# Patient Record
Sex: Female | Born: 1960 | Race: Black or African American | Hispanic: No | Smoking: Current every day smoker
Health system: Southern US, Community
[De-identification: ages and names within clinical notes are randomized; demographics above are authoritative.]

## PROBLEM LIST (undated history)

## (undated) ENCOUNTER — Emergency Department (HOSPITAL_COMMUNITY): Admission: EM | Payer: Medicare Other | Source: Home / Self Care

## (undated) DIAGNOSIS — I2510 Atherosclerotic heart disease of native coronary artery without angina pectoris: Secondary | ICD-10-CM

## (undated) DIAGNOSIS — I209 Angina pectoris, unspecified: Secondary | ICD-10-CM

## (undated) DIAGNOSIS — I498 Other specified cardiac arrhythmias: Secondary | ICD-10-CM

## (undated) DIAGNOSIS — N289 Disorder of kidney and ureter, unspecified: Secondary | ICD-10-CM

## (undated) DIAGNOSIS — Z9889 Other specified postprocedural states: Secondary | ICD-10-CM

## (undated) DIAGNOSIS — A388 Scarlet fever with other complications: Secondary | ICD-10-CM

## (undated) DIAGNOSIS — Z9189 Other specified personal risk factors, not elsewhere classified: Secondary | ICD-10-CM

## (undated) DIAGNOSIS — A6000 Herpesviral infection of urogenital system, unspecified: Secondary | ICD-10-CM

## (undated) DIAGNOSIS — D126 Benign neoplasm of colon, unspecified: Secondary | ICD-10-CM

## (undated) DIAGNOSIS — K759 Inflammatory liver disease, unspecified: Secondary | ICD-10-CM

## (undated) DIAGNOSIS — E785 Hyperlipidemia, unspecified: Secondary | ICD-10-CM

## (undated) DIAGNOSIS — K5792 Diverticulitis of intestine, part unspecified, without perforation or abscess without bleeding: Secondary | ICD-10-CM

## (undated) DIAGNOSIS — T7491XA Unspecified adult maltreatment, confirmed, initial encounter: Secondary | ICD-10-CM

## (undated) DIAGNOSIS — F509 Eating disorder, unspecified: Secondary | ICD-10-CM

## (undated) DIAGNOSIS — R51 Headache: Secondary | ICD-10-CM

## (undated) DIAGNOSIS — E119 Type 2 diabetes mellitus without complications: Secondary | ICD-10-CM

## (undated) DIAGNOSIS — R112 Nausea with vomiting, unspecified: Secondary | ICD-10-CM

## (undated) DIAGNOSIS — F4310 Post-traumatic stress disorder, unspecified: Secondary | ICD-10-CM

## (undated) DIAGNOSIS — F329 Major depressive disorder, single episode, unspecified: Secondary | ICD-10-CM

## (undated) DIAGNOSIS — E559 Vitamin D deficiency, unspecified: Secondary | ICD-10-CM

## (undated) DIAGNOSIS — R519 Headache, unspecified: Secondary | ICD-10-CM

## (undated) DIAGNOSIS — F32A Depression, unspecified: Secondary | ICD-10-CM

## (undated) DIAGNOSIS — I10 Essential (primary) hypertension: Secondary | ICD-10-CM

## (undated) DIAGNOSIS — J189 Pneumonia, unspecified organism: Secondary | ICD-10-CM

## (undated) DIAGNOSIS — K219 Gastro-esophageal reflux disease without esophagitis: Secondary | ICD-10-CM

## (undated) HISTORY — PX: ROTATOR CUFF REPAIR: SHX139

## (undated) HISTORY — DX: Other specified personal risk factors, not elsewhere classified: Z91.89

## (undated) HISTORY — DX: Headache: R51

## (undated) HISTORY — DX: Scarlet fever with other complications: A38.8

## (undated) HISTORY — DX: Unspecified adult maltreatment, confirmed, initial encounter: T74.91XA

## (undated) HISTORY — DX: Major depressive disorder, single episode, unspecified: F32.9

## (undated) HISTORY — PX: OTHER SURGICAL HISTORY: SHX169

## (undated) HISTORY — DX: Hyperlipidemia, unspecified: E78.5

## (undated) HISTORY — DX: Depression, unspecified: F32.A

## (undated) HISTORY — PX: UMBILICAL HERNIA REPAIR: SHX196

## (undated) HISTORY — DX: Atherosclerotic heart disease of native coronary artery without angina pectoris: I25.10

## (undated) HISTORY — DX: Eating disorder, unspecified: F50.9

## (undated) HISTORY — DX: Vitamin D deficiency, unspecified: E55.9

## (undated) HISTORY — DX: Diverticulitis of intestine, part unspecified, without perforation or abscess without bleeding: K57.92

## (undated) HISTORY — DX: Herpesviral infection of urogenital system, unspecified: A60.00

## (undated) HISTORY — PX: BUNIONECTOMY: SHX129

## (undated) HISTORY — DX: Type 2 diabetes mellitus without complications: E11.9

## (undated) HISTORY — PX: ENDOMETRIAL ABLATION: SHX621

## (undated) HISTORY — DX: Headache, unspecified: R51.9

## (undated) HISTORY — PX: SHOULDER SURGERY: SHX246

---
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
## 2011-11-18 DIAGNOSIS — A498 Other bacterial infections of unspecified site: Secondary | ICD-10-CM | POA: Insufficient documentation

## 2012-01-13 DIAGNOSIS — M5030 Other cervical disc degeneration, unspecified cervical region: Secondary | ICD-10-CM

## 2012-01-13 DIAGNOSIS — M549 Dorsalgia, unspecified: Secondary | ICD-10-CM | POA: Insufficient documentation

## 2012-01-13 DIAGNOSIS — M4317 Spondylolisthesis, lumbosacral region: Secondary | ICD-10-CM

## 2012-01-13 DIAGNOSIS — G894 Chronic pain syndrome: Secondary | ICD-10-CM | POA: Insufficient documentation

## 2012-01-13 DIAGNOSIS — Z79899 Other long term (current) drug therapy: Secondary | ICD-10-CM | POA: Insufficient documentation

## 2012-01-13 HISTORY — DX: Chronic pain syndrome: G89.4

## 2012-01-13 HISTORY — DX: Other cervical disc degeneration, unspecified cervical region: M50.30

## 2012-01-13 HISTORY — DX: Other long term (current) drug therapy: Z79.899

## 2012-01-13 HISTORY — DX: Spondylolisthesis, lumbosacral region: M43.17

## 2012-01-23 DIAGNOSIS — R109 Unspecified abdominal pain: Secondary | ICD-10-CM | POA: Insufficient documentation

## 2012-01-23 DIAGNOSIS — R10A1 Flank pain, right side: Secondary | ICD-10-CM | POA: Insufficient documentation

## 2013-02-28 DIAGNOSIS — G4700 Insomnia, unspecified: Secondary | ICD-10-CM

## 2013-02-28 HISTORY — DX: Insomnia, unspecified: G47.00

## 2013-08-14 DIAGNOSIS — N35919 Unspecified urethral stricture, male, unspecified site: Secondary | ICD-10-CM

## 2013-08-14 HISTORY — DX: Unspecified urethral stricture, male, unspecified site: N35.919

## 2013-11-08 DIAGNOSIS — M129 Arthropathy, unspecified: Secondary | ICD-10-CM

## 2013-11-08 HISTORY — DX: Arthropathy, unspecified: M12.9

## 2013-12-26 DIAGNOSIS — F17200 Nicotine dependence, unspecified, uncomplicated: Secondary | ICD-10-CM

## 2013-12-26 HISTORY — DX: Nicotine dependence, unspecified, uncomplicated: F17.200

## 2014-02-16 DIAGNOSIS — J189 Pneumonia, unspecified organism: Secondary | ICD-10-CM

## 2014-02-16 HISTORY — DX: Pneumonia, unspecified organism: J18.9

## 2014-02-16 HISTORY — PX: URETHROPLASTY: SHX499

## 2014-02-28 DIAGNOSIS — J208 Acute bronchitis due to other specified organisms: Secondary | ICD-10-CM | POA: Insufficient documentation

## 2014-06-05 DIAGNOSIS — M542 Cervicalgia: Secondary | ICD-10-CM | POA: Insufficient documentation

## 2014-06-05 DIAGNOSIS — M2550 Pain in unspecified joint: Secondary | ICD-10-CM | POA: Insufficient documentation

## 2014-07-18 DIAGNOSIS — IMO0002 Reserved for concepts with insufficient information to code with codable children: Secondary | ICD-10-CM

## 2014-07-18 HISTORY — DX: Reserved for concepts with insufficient information to code with codable children: IMO0002

## 2014-09-04 DIAGNOSIS — F332 Major depressive disorder, recurrent severe without psychotic features: Secondary | ICD-10-CM | POA: Insufficient documentation

## 2014-09-04 HISTORY — DX: Major depressive disorder, recurrent severe without psychotic features: F33.2

## 2014-09-17 DIAGNOSIS — F419 Anxiety disorder, unspecified: Secondary | ICD-10-CM | POA: Insufficient documentation

## 2014-09-17 DIAGNOSIS — F329 Major depressive disorder, single episode, unspecified: Secondary | ICD-10-CM | POA: Insufficient documentation

## 2014-09-17 DIAGNOSIS — F32A Depression, unspecified: Secondary | ICD-10-CM | POA: Insufficient documentation

## 2014-09-17 DIAGNOSIS — E876 Hypokalemia: Secondary | ICD-10-CM

## 2014-09-17 HISTORY — DX: Anxiety disorder, unspecified: F41.9

## 2014-09-17 HISTORY — DX: Hypokalemia: E87.6

## 2014-11-02 DIAGNOSIS — M7918 Myalgia, other site: Secondary | ICD-10-CM | POA: Insufficient documentation

## 2014-11-22 DIAGNOSIS — F1390 Sedative, hypnotic, or anxiolytic use, unspecified, uncomplicated: Secondary | ICD-10-CM | POA: Insufficient documentation

## 2014-11-22 DIAGNOSIS — F1310 Sedative, hypnotic or anxiolytic abuse, uncomplicated: Secondary | ICD-10-CM

## 2014-11-22 HISTORY — DX: Sedative, hypnotic, or anxiolytic use, unspecified, uncomplicated: F13.90

## 2015-02-17 DIAGNOSIS — N289 Disorder of kidney and ureter, unspecified: Secondary | ICD-10-CM

## 2015-02-17 HISTORY — DX: Disorder of kidney and ureter, unspecified: N28.9

## 2015-03-06 DIAGNOSIS — M7551 Bursitis of right shoulder: Secondary | ICD-10-CM | POA: Insufficient documentation

## 2015-03-06 HISTORY — DX: Bursitis of right shoulder: M75.51

## 2015-03-18 DIAGNOSIS — M47817 Spondylosis without myelopathy or radiculopathy, lumbosacral region: Secondary | ICD-10-CM | POA: Insufficient documentation

## 2015-03-18 DIAGNOSIS — M47812 Spondylosis without myelopathy or radiculopathy, cervical region: Secondary | ICD-10-CM | POA: Insufficient documentation

## 2015-03-18 HISTORY — DX: Spondylosis without myelopathy or radiculopathy, cervical region: M47.812

## 2015-03-18 HISTORY — DX: Spondylosis without myelopathy or radiculopathy, lumbosacral region: M47.817

## 2015-03-27 DIAGNOSIS — M7541 Impingement syndrome of right shoulder: Secondary | ICD-10-CM

## 2015-03-27 HISTORY — DX: Impingement syndrome of right shoulder: M75.41

## 2016-01-28 DIAGNOSIS — Z9889 Other specified postprocedural states: Secondary | ICD-10-CM | POA: Insufficient documentation

## 2016-01-28 DIAGNOSIS — IMO0002 Reserved for concepts with insufficient information to code with codable children: Secondary | ICD-10-CM | POA: Insufficient documentation

## 2016-01-28 HISTORY — DX: Reserved for concepts with insufficient information to code with codable children: IMO0002

## 2016-01-28 HISTORY — DX: Other specified postprocedural states: Z98.890

## 2016-02-24 ENCOUNTER — Encounter (HOSPITAL_COMMUNITY): Payer: Self-pay

## 2016-02-24 ENCOUNTER — Emergency Department (HOSPITAL_COMMUNITY)

## 2016-02-24 ENCOUNTER — Emergency Department (HOSPITAL_COMMUNITY)
Admission: EM | Admit: 2016-02-24 | Discharge: 2016-02-24 | Disposition: A | Discharge status: Home / Self Care | Attending: Emergency Medicine | Admitting: Emergency Medicine

## 2016-02-24 DIAGNOSIS — J189 Pneumonia, unspecified organism: Secondary | ICD-10-CM | POA: Insufficient documentation

## 2016-02-24 DIAGNOSIS — R0602 Shortness of breath: Secondary | ICD-10-CM | POA: Diagnosis present

## 2016-02-24 DIAGNOSIS — J441 Chronic obstructive pulmonary disease with (acute) exacerbation: Secondary | ICD-10-CM | POA: Insufficient documentation

## 2016-02-24 DIAGNOSIS — F17200 Nicotine dependence, unspecified, uncomplicated: Secondary | ICD-10-CM | POA: Diagnosis not present

## 2016-02-24 DIAGNOSIS — I10 Essential (primary) hypertension: Secondary | ICD-10-CM | POA: Insufficient documentation

## 2016-02-24 HISTORY — DX: Gastro-esophageal reflux disease without esophagitis: K21.9

## 2016-02-24 HISTORY — DX: Post-traumatic stress disorder, unspecified: F43.10

## 2016-02-24 HISTORY — DX: Essential (primary) hypertension: I10

## 2016-02-24 LAB — I-STAT TROPONIN, ED
Troponin i, poc: 0.01 ng/mL (ref 0.00–0.08)
Troponin i, poc: 0 ng/mL (ref 0.00–0.08)

## 2016-02-24 LAB — COMPREHENSIVE METABOLIC PANEL
ALBUMIN: 4.3 g/dL (ref 3.5–5.0)
ALK PHOS: 128 U/L — AB (ref 38–126)
ALT: 22 U/L (ref 14–54)
ANION GAP: 8 (ref 5–15)
AST: 29 U/L (ref 15–41)
BILIRUBIN TOTAL: 0.3 mg/dL (ref 0.3–1.2)
BUN: 9 mg/dL (ref 6–20)
CO2: 26 mmol/L (ref 22–32)
CREATININE: 0.88 mg/dL (ref 0.44–1.00)
Calcium: 9.6 mg/dL (ref 8.9–10.3)
Chloride: 108 mmol/L (ref 101–111)
GFR calc Af Amer: >60 mL/min (ref >60)
GFR calc non Af Amer: >60 mL/min (ref >60)
GLUCOSE: 129 mg/dL — AB (ref 65–99)
Potassium: 3.1 mmol/L — ABNORMAL LOW (ref 3.5–5.1)
SODIUM: 142 mmol/L (ref 135–145)
TOTAL PROTEIN: 7.4 g/dL (ref 6.5–8.1)

## 2016-02-24 LAB — CBC WITH DIFFERENTIAL/PLATELET
BASOS PCT: 1 %
Basophils Absolute: 0 10*3/uL (ref 0.0–0.1)
Eosinophils Absolute: 0.4 10*3/uL (ref 0.0–0.7)
Eosinophils Relative: 7 %
HEMATOCRIT: 35.2 % — AB (ref 36.0–46.0)
HEMOGLOBIN: 11.8 g/dL — AB (ref 12.0–15.0)
LYMPHS ABS: 2 10*3/uL (ref 0.7–4.0)
Lymphocytes Relative: 38 %
MCH: 30.4 pg (ref 26.0–34.0)
MCHC: 33.5 g/dL (ref 30.0–36.0)
MCV: 90.7 fL (ref 78.0–100.0)
MONOS PCT: 8 %
Monocytes Absolute: 0.4 10*3/uL (ref 0.1–1.0)
NEUTROS ABS: 2.4 10*3/uL (ref 1.7–7.7)
NEUTROS PCT: 46 %
Platelets: 250 10*3/uL (ref 150–400)
RBC: 3.88 MIL/uL (ref 3.87–5.11)
RDW: 14.7 % (ref 11.5–15.5)
WBC: 5.3 10*3/uL (ref 4.0–10.5)

## 2016-02-24 LAB — BRAIN NATRIURETIC PEPTIDE: B Natriuretic Peptide: 53 pg/mL (ref 0.0–100.0)

## 2016-02-24 MED ORDER — LEVOFLOXACIN 750 MG PO TABS
750.0000 mg | ORAL_TABLET | Freq: Every day | ORAL | 0 refills | Status: DC
Start: 2016-02-24 — End: 2016-03-02

## 2016-02-24 MED ORDER — ALBUTEROL (5 MG/ML) CONTINUOUS INHALATION SOLN
10.0000 mg/h | INHALATION_SOLUTION | Freq: Once | RESPIRATORY_TRACT | Status: AC
  Administered 2016-02-24: 10 mg/h via RESPIRATORY_TRACT
  Filled 2016-02-24: qty 20

## 2016-02-24 MED ORDER — PREDNISONE 20 MG PO TABS: 40.0000 mg | ORAL_TABLET | Freq: Every day | ORAL | 0 refills | Status: DC

## 2016-02-24 MED ORDER — HYDROCODONE-HOMATROPINE 5-1.5 MG/5ML PO SYRP: 5.0000 mL | ORAL_SOLUTION | Freq: Four times a day (QID) | ORAL | 0 refills | Status: DC | PRN

## 2016-02-24 MED ORDER — SODIUM CHLORIDE 0.9 % IV BOLUS (SEPSIS)
1000.0000 mL | Freq: Once | INTRAVENOUS | Status: AC
  Administered 2016-02-24: 1000 mL via INTRAVENOUS

## 2016-02-24 MED ORDER — ALBUTEROL SULFATE HFA 108 (90 BASE) MCG/ACT IN AERS
2.0000 | INHALATION_SPRAY | Freq: Once | RESPIRATORY_TRACT | Status: AC
  Administered 2016-02-24: 2 via RESPIRATORY_TRACT
  Filled 2016-02-24: qty 6.7

## 2016-02-24 MED ORDER — LEVOFLOXACIN IN D5W 750 MG/150ML IV SOLN
750.0000 mg | Freq: Once | INTRAVENOUS | Status: AC
  Administered 2016-02-24: 750 mg via INTRAVENOUS
  Filled 2016-02-24: qty 150

## 2016-02-24 MED ORDER — AEROCHAMBER PLUS W/MASK MISC
1.0000 | Freq: Once | Status: AC
  Administered 2016-02-24: 1
  Filled 2016-02-24: qty 1

## 2016-02-24 MED ORDER — KETOROLAC TROMETHAMINE 15 MG/ML IJ SOLN
15.0000 mg | Freq: Once | INTRAMUSCULAR | Status: AC
Start: 2016-02-24 — End: 2016-02-24
  Administered 2016-02-24: 15 mg via INTRAVENOUS
  Filled 2016-02-24: qty 1

## 2016-02-24 MED ORDER — IPRATROPIUM BROMIDE 0.02 % IN SOLN
1.0000 mg | Freq: Once | RESPIRATORY_TRACT | Status: AC
  Administered 2016-02-24: 1 mg via RESPIRATORY_TRACT
  Filled 2016-02-24: qty 5

## 2016-02-24 MED ORDER — METHYLPREDNISOLONE SODIUM SUCC 125 MG IJ SOLR
125.0000 mg | Freq: Once | INTRAMUSCULAR | Status: AC
  Administered 2016-02-24: 125 mg via INTRAVENOUS
  Filled 2016-02-24: qty 2

## 2016-02-24 MED ORDER — LORAZEPAM 2 MG/ML IJ SOLN
1.0000 mg | Freq: Once | INTRAMUSCULAR | Status: AC
  Administered 2016-02-24: 1 mg via INTRAVENOUS
  Filled 2016-02-24: qty 1

## 2016-02-24 MED ORDER — ALBUTEROL SULFATE (2.5 MG/3ML) 0.083% IN NEBU
INHALATION_SOLUTION | RESPIRATORY_TRACT | Status: AC
  Administered 2016-02-24: 5 mg via RESPIRATORY_TRACT
  Filled 2016-02-24: qty 6

## 2016-02-24 MED ORDER — HYDROCODONE-HOMATROPINE 5-1.5 MG/5ML PO SYRP
5.0000 mL | ORAL_SOLUTION | Freq: Once | ORAL | Status: AC
  Administered 2016-02-24: 5 mL via ORAL
  Filled 2016-02-24: qty 5

## 2016-02-24 MED ORDER — ALBUTEROL SULFATE (2.5 MG/3ML) 0.083% IN NEBU
5.0000 mg | INHALATION_SOLUTION | Freq: Once | RESPIRATORY_TRACT | Status: AC
  Administered 2016-02-24: 5 mg via RESPIRATORY_TRACT

## 2016-02-24 NOTE — ED Triage Notes (Signed)
Pt states she started having a cough with Sob 2 days ago; Pt a&ox 4 on arrival. Neuro in tact; Pt slightly wheezing at triage;

## 2016-02-24 NOTE — ED Provider Notes (Signed)
[name] DEPT Provider Note   CSN: [id number] Arrival date & time: 02/24/16  [id number]     History   Chief Complaint Chief Complaint  Patient presents with  . Cough  . Shortness of Breath    HPI [name] is a 56 y.o. female.  HPI 56 year old female with history of hypertension, PTSD, and chronic tobacco use who presents with cough, congestion, and subjective fevers. The patient states that her symptoms started 2 days ago. She was recently caring for her grandchildren who all had cough, fever, and congestion. She took them back to their houses but then developed cough, congestion, and rhinorrhea. Over the last 48 hours, she has had progressively worsening cough, wheezing, and shortness of breath. She also has a sharp, intermittent chest pain that is only with coughing. Denies any worsening with deep inspiration. No history of blood clots. She has also had subjective fevers and chills with mild shortness of breath over the same time. She does have a history of smoking and states she has used inhalers as well as steroids in the past but denies known history of COPD. No abdominal pain, nausea, or vomiting.  Past Medical History:  Diagnosis Date  . GERD (gastroesophageal reflux disease)   . Hypertension   . PTSD (post-traumatic stress disorder)     There are no active problems to display for this patient.   History reviewed. No pertinent surgical history.  OB History    No data available       Home Medications    Prior to Admission medications   Medication Sig Start Date End Date Taking? Authorizing Provider  amLODipine (NORVASC) 10 MG tablet Take 10 mg by mouth daily.   Yes Historical Provider, MD  atorvastatin (LIPITOR) 20 MG tablet Take 20 mg by mouth daily.   Yes Historical Provider, MD  buPROPion (WELLBUTRIN) 75 MG tablet Take 75 mg by mouth 2 (two) times daily.   Yes Historical Provider, MD  clotrimazole-betamethasone (LOTRISONE) cream Apply 1 application  topically at bedtime.   Yes Historical Provider, MD  lidocaine (LIDODERM) 5 % Place 1 patch onto the skin daily. Remove & Discard patch within 12 hours or as directed by MD   Yes Historical Provider, MD  LORazepam (ATIVAN) 0.5 MG tablet Take 0.5 mg by mouth every 8 (eight) hours as needed for anxiety.   Yes Historical Provider, MD  losartan (COZAAR) 25 MG tablet Take 25 mg by mouth daily.   Yes Historical Provider, MD  metFORMIN (GLUCOPHAGE) 500 MG tablet Take 500 mg by mouth 2 (two) times daily with a meal.   Yes Historical Provider, MD  methocarbamol (ROBAXIN) 500 MG tablet Take 500 mg by mouth 3 (three) times daily.   Yes Historical Provider, MD  pantoprazole (PROTONIX) 40 MG tablet Take 40 mg by mouth daily.   Yes Historical Provider, MD  pregabalin (LYRICA) 75 MG capsule Take 150 mg by mouth 2 (two) times daily.   Yes Historical Provider, MD  sennosides-docusate sodium (SENOKOT-S) 8.6-50 MG tablet Take 1 tablet by mouth daily.   Yes Historical Provider, MD  sertraline (ZOLOFT) 100 MG tablet Take 100 mg by mouth daily.   Yes Historical Provider, MD  HYDROcodone-homatropine (HYCODAN) 5-1.5 MG/5ML syrup Take 5 mLs by mouth every 6 (six) hours as needed for cough. 02/24/16   Duffy Bruce, MD  levofloxacin (LEVAQUIN) 750 MG tablet Take 1 tablet (750 mg total) by mouth daily. 02/24/16 03/02/16  Duffy Bruce, MD  predniSONE (DELTASONE) 20 MG tablet Take 2  tablets (40 mg total) by mouth daily. 02/24/16 02/29/16  Duffy Bruce, MD    Family History No family history on file.  Social History Social History  Substance Use Topics  . Smoking status: Current Some Day Smoker  . Smokeless tobacco: Not on file  . Alcohol use Not on file     Allergies   Asa [aspirin]; Morphine and related; and Oxycodone   Review of Systems Review of Systems  Constitutional: Positive for chills, fatigue and fever.  HENT: Positive for congestion, rhinorrhea and sore throat.   Eyes: Negative for visual disturbance.    Respiratory: Positive for cough, chest tightness, shortness of breath and wheezing.   Cardiovascular: Negative for chest pain and leg swelling.  Gastrointestinal: Negative for abdominal pain, diarrhea, nausea and vomiting.  Genitourinary: Negative for dysuria, flank pain, vaginal bleeding and vaginal discharge.  Musculoskeletal: Negative for neck pain.  Skin: Negative for rash.  Allergic/Immunologic: Negative for immunocompromised state.  Neurological: Negative for syncope and headaches.  Hematological: Does not bruise/bleed easily.  All other systems reviewed and are negative.    Physical Exam Updated Vital Signs BP 129/87 (BP Location: Right Arm)   Pulse 91   Temp 98.3 F (36.8 C) (Oral)   Resp 18   Ht 5\' 5"  (1.651 m)   Wt 140 lb (63.5 kg)   LMP  (LMP Unknown)   SpO2 100%   BMI 23.30 kg/m   Physical Exam  Constitutional: She is oriented to person, place, and time. She appears well-developed and well-nourished. No distress.  Nontoxic  HENT:  Head: Normocephalic and atraumatic.  Moderate posterior pharyngeal erythema without tonsillar exudates. Marked rhinorrhea and nasal congestion bilaterally.  Eyes: Conjunctivae are normal.  Neck: Neck supple.  Cardiovascular: Normal rate, regular rhythm and normal heart sounds.  Exam reveals no friction rub.   No murmur heard. Pulmonary/Chest: Effort normal. Tachypnea noted. No respiratory distress. She has decreased breath sounds. She has wheezes in the right upper field, the right middle field, the right lower field, the left upper field, the left middle field and the left lower field. She has no rales.  Abdominal: She exhibits no distension.  Musculoskeletal: She exhibits no edema.  Neurological: She is alert and oriented to person, place, and time. She exhibits normal muscle tone.  Skin: Skin is warm. Capillary refill takes less than 2 seconds.  Psychiatric: She has a normal mood and affect.  Nursing note and vitals  reviewed.    ED Treatments / Results  Labs (all labs ordered are listed, but only abnormal results are displayed) Labs Reviewed  CBC WITH DIFFERENTIAL/PLATELET - Abnormal; Notable for the following:       Result Value   Hemoglobin 11.8 (*)    HCT 35.2 (*)    All other components within normal limits  COMPREHENSIVE METABOLIC PANEL - Abnormal; Notable for the following:    Potassium 3.1 (*)    Glucose, Bld 129 (*)    Alkaline Phosphatase 128 (*)    All other components within normal limits  BRAIN NATRIURETIC PEPTIDE  I-STAT TROPOININ, ED  I-STAT TROPOININ, ED    EKG  EKG Interpretation  Date/Time:  Monday February 24 2016 06:26:59 EST Ventricular Rate:  106 PR Interval:  146 QRS Duration: 74 QT Interval:  360 QTC Calculation: 478 R Axis:   75 Text Interpretation:  Sinus tachycardia Possible Left atrial enlargement Borderline ECG No old tracing to compare Confirmed by WARD,  DO, KRISTEN ([id number]) on 02/24/2016 6:54:37 AM Also confirmed by  WARD,  DO, KRISTEN [phone]), editor Yehuda Mao [id number])  on 02/24/2016 8:26:40 AM       Radiology Dg Chest 2 View  Result Date: 02/24/2016 CLINICAL DATA:  Cough and shortness of breath began 2 days ago. Current smoker. History of gastroesophageal reflux. EXAM: CHEST  2 VIEW COMPARISON:  None in PACs FINDINGS: The lungs are well-expanded. The interstitial markings are coarse bilaterally. The heart and pulmonary vascularity are normal. The mediastinum is normal in width. The bony thorax exhibits no acute abnormality. IMPRESSION: Mild interstitial prominence may be acute or chronic. If acute this could reflect early interstitial pneumonia or less likely interstitial edema. If chronic the findings may reflect this patient's smoking history and associated chronic bronchitis. Electronically Signed   By: David  Martinique M.D.   On: 02/24/2016 07:38    Procedures Procedures (including critical care time)  Medications Ordered in ED Medications  sodium  chloride 0.9 % bolus 1,000 mL (1,000 mLs Intravenous New Bag/Given 02/24/16 1138)  levofloxacin (LEVAQUIN) IVPB 750 mg (750 mg Intravenous New Bag/Given 02/24/16 1145)  albuterol (PROVENTIL HFA;VENTOLIN HFA) 108 (90 Base) MCG/ACT inhaler 2 puff (not administered)  AEROCHAMBER PLUS FLO-VU MEDIUM MISC 1 each (not administered)  albuterol (PROVENTIL) (2.5 MG/3ML) 0.083% nebulizer solution 5 mg (5 mg Nebulization Given 02/24/16 0639)  albuterol (PROVENTIL,VENTOLIN) solution continuous neb (10 mg/hr Nebulization Given 02/24/16 1015)  ipratropium (ATROVENT) nebulizer solution 1 mg (1 mg Nebulization Given 02/24/16 1015)  methylPREDNISolone sodium succinate (SOLU-MEDROL) 125 mg/2 mL injection 125 mg (125 mg Intravenous Given 02/24/16 1138)  HYDROcodone-homatropine (HYCODAN) 5-1.5 MG/5ML syrup 5 mL (5 mLs Oral Given 02/24/16 1132)  LORazepam (ATIVAN) injection 1 mg (1 mg Intravenous Given 02/24/16 1214)  ketorolac (TORADOL) 15 MG/ML injection 15 mg (15 mg Intravenous Given 02/24/16 1214)     Initial Impression / Assessment and Plan / ED Course  I have reviewed the triage vital signs and the nursing notes.  Pertinent labs & imaging results that were available during my care of the patient were reviewed by me and considered in my medical decision making (see chart for details).  Clinical Course     56 yo F with strong h/o tobacco use here with cough, fever, congestion, and sore throat. On arrival, VSS and WNL. Pt with diffuse wheezing on exam, increased WOB. Suspect viral URI versus CAP versus influenza/ILI with likely underlying component of COPD. Will give nebs, IVF, and re-assess.  CXR c/w atypical PNA. Interstitial edema on DDx but BNP nromal, renal function normal - suspect atypical PNA. Will start levaquin, monitor in ED. Pt o/w HDS, well-appearing and can likely be managed as outpt. Given COPD and known ILI contacts, c/f influenza as well - will discuss risks/benefits of tamiflu with pt.  Pt markedly improved  after nebs, fluids. Wheezing resolved on exam. However, pt intermittent forcing herself to wheeze when asked to breathe deeply but with distraction, wheezing resolved with good aeration and O2 Sats >90% on RA with ambulation. Will treat as outpt CAP with Levaquin, steroids, antitussives and good PCP f/u with return precautions.  Final Clinical Impressions(s) / ED Diagnoses   Final diagnoses:  Community acquired pneumonia, unspecified laterality  COPD exacerbation ([place])    New Prescriptions New Prescriptions   HYDROCODONE-HOMATROPINE (HYCODAN) 5-1.5 MG/5ML SYRUP    Take 5 mLs by mouth every 6 (six) hours as needed for cough.   LEVOFLOXACIN (LEVAQUIN) 750 MG TABLET    Take 1 tablet (750 mg total) by mouth daily.   PREDNISONE (DELTASONE) 20 MG  TABLET    Take 2 tablets (40 mg total) by mouth daily.     Duffy Bruce, MD 02/24/16 [phone]

## 2016-02-28 ENCOUNTER — Encounter (HOSPITAL_COMMUNITY): Payer: Self-pay

## 2016-02-28 ENCOUNTER — Emergency Department (HOSPITAL_COMMUNITY)

## 2016-02-28 ENCOUNTER — Inpatient Hospital Stay (HOSPITAL_COMMUNITY)
Admission: EM | Admit: 2016-02-28 | Discharge: 2016-03-02 | DRG: 190 | Disposition: A | Discharge status: Home / Self Care | Attending: Internal Medicine | Admitting: Internal Medicine

## 2016-02-28 DIAGNOSIS — J441 Chronic obstructive pulmonary disease with (acute) exacerbation: Principal | ICD-10-CM | POA: Diagnosis present

## 2016-02-28 DIAGNOSIS — J449 Chronic obstructive pulmonary disease, unspecified: Secondary | ICD-10-CM

## 2016-02-28 DIAGNOSIS — E785 Hyperlipidemia, unspecified: Secondary | ICD-10-CM | POA: Diagnosis not present

## 2016-02-28 DIAGNOSIS — F329 Major depressive disorder, single episode, unspecified: Secondary | ICD-10-CM | POA: Diagnosis not present

## 2016-02-28 DIAGNOSIS — Z7952 Long term (current) use of systemic steroids: Secondary | ICD-10-CM | POA: Diagnosis not present

## 2016-02-28 DIAGNOSIS — J209 Acute bronchitis, unspecified: Secondary | ICD-10-CM | POA: Diagnosis not present

## 2016-02-28 DIAGNOSIS — F4310 Post-traumatic stress disorder, unspecified: Secondary | ICD-10-CM | POA: Diagnosis present

## 2016-02-28 DIAGNOSIS — J9601 Acute respiratory failure with hypoxia: Secondary | ICD-10-CM | POA: Diagnosis not present

## 2016-02-28 DIAGNOSIS — J440 Chronic obstructive pulmonary disease with acute lower respiratory infection: Secondary | ICD-10-CM | POA: Diagnosis present

## 2016-02-28 DIAGNOSIS — E119 Type 2 diabetes mellitus without complications: Secondary | ICD-10-CM | POA: Diagnosis present

## 2016-02-28 DIAGNOSIS — Z885 Allergy status to narcotic agent status: Secondary | ICD-10-CM | POA: Diagnosis not present

## 2016-02-28 DIAGNOSIS — Z720 Tobacco use: Secondary | ICD-10-CM

## 2016-02-28 DIAGNOSIS — F17200 Nicotine dependence, unspecified, uncomplicated: Secondary | ICD-10-CM | POA: Diagnosis present

## 2016-02-28 DIAGNOSIS — Z888 Allergy status to other drugs, medicaments and biological substances status: Secondary | ICD-10-CM | POA: Diagnosis not present

## 2016-02-28 DIAGNOSIS — Z886 Allergy status to analgesic agent status: Secondary | ICD-10-CM

## 2016-02-28 DIAGNOSIS — K219 Gastro-esophageal reflux disease without esophagitis: Secondary | ICD-10-CM

## 2016-02-28 DIAGNOSIS — I10 Essential (primary) hypertension: Secondary | ICD-10-CM | POA: Diagnosis not present

## 2016-02-28 DIAGNOSIS — E118 Type 2 diabetes mellitus with unspecified complications: Secondary | ICD-10-CM

## 2016-02-28 DIAGNOSIS — IMO0001 Reserved for inherently not codable concepts without codable children: Secondary | ICD-10-CM

## 2016-02-28 DIAGNOSIS — R0602 Shortness of breath: Secondary | ICD-10-CM | POA: Diagnosis present

## 2016-02-28 HISTORY — DX: Chronic obstructive pulmonary disease with (acute) exacerbation: J44.1

## 2016-02-28 HISTORY — DX: Nicotine dependence, unspecified, uncomplicated: F17.200

## 2016-02-28 HISTORY — DX: Chronic obstructive pulmonary disease, unspecified: J44.9

## 2016-02-28 HISTORY — DX: Gastro-esophageal reflux disease without esophagitis: K21.9

## 2016-02-28 HISTORY — DX: Reserved for inherently not codable concepts without codable children: IMO0001

## 2016-02-28 HISTORY — DX: Disorder of kidney and ureter, unspecified: N28.9

## 2016-02-28 LAB — CBC WITH DIFFERENTIAL/PLATELET
BASOS ABS: 0 10*3/uL (ref 0.0–0.1)
BASOS PCT: 1 %
EOS ABS: 0.4 10*3/uL (ref 0.0–0.7)
EOS PCT: 5 %
HCT: 36.2 % (ref 36.0–46.0)
Hemoglobin: 12.2 g/dL (ref 12.0–15.0)
Lymphocytes Relative: 45 %
Lymphs Abs: 3.8 10*3/uL (ref 0.7–4.0)
MCH: 29.9 pg (ref 26.0–34.0)
MCHC: 33.7 g/dL (ref 30.0–36.0)
MCV: 88.7 fL (ref 78.0–100.0)
MONO ABS: 0.6 10*3/uL (ref 0.1–1.0)
Monocytes Relative: 7 %
Neutro Abs: 3.5 10*3/uL (ref 1.7–7.7)
Neutrophils Relative %: 42 %
PLATELETS: 230 10*3/uL (ref 150–400)
RBC: 4.08 MIL/uL (ref 3.87–5.11)
RDW: 14.5 % (ref 11.5–15.5)
WBC: 8.2 10*3/uL (ref 4.0–10.5)

## 2016-02-28 LAB — BASIC METABOLIC PANEL
Anion gap: 10 (ref 5–15)
BUN: 13 mg/dL (ref 6–20)
CALCIUM: 9.7 mg/dL (ref 8.9–10.3)
CO2: 23 mmol/L (ref 22–32)
Chloride: 105 mmol/L (ref 101–111)
Creatinine, Ser: 0.89 mg/dL (ref 0.44–1.00)
GFR calc Af Amer: >60 mL/min (ref >60)
GLUCOSE: 126 mg/dL — AB (ref 65–99)
Potassium: 3.7 mmol/L (ref 3.5–5.1)
Sodium: 138 mmol/L (ref 135–145)

## 2016-02-28 LAB — GLUCOSE, CAPILLARY: Glucose-Capillary: 226 mg/dL — ABNORMAL HIGH (ref 65–99)

## 2016-02-28 LAB — PROCALCITONIN

## 2016-02-28 MED ORDER — INSULIN ASPART 100 UNIT/ML ~~LOC~~ SOLN
0.0000 [IU] | Freq: Every day | SUBCUTANEOUS | Status: DC
Start: 2016-02-28 — End: 2016-03-02
  Administered 2016-02-28: 2 [IU] via SUBCUTANEOUS

## 2016-02-28 MED ORDER — IPRATROPIUM-ALBUTEROL 0.5-2.5 (3) MG/3ML IN SOLN
3.0000 mL | Freq: Three times a day (TID) | RESPIRATORY_TRACT | Status: DC
  Administered 2016-02-29 – 2016-03-02 (×8): 3 mL via RESPIRATORY_TRACT
  Filled 2016-02-28 (×8): qty 3

## 2016-02-28 MED ORDER — LORAZEPAM 0.5 MG PO TABS
0.5000 mg | ORAL_TABLET | Freq: Three times a day (TID) | ORAL | Status: DC | PRN
  Administered 2016-02-29 – 2016-03-02 (×5): 0.5 mg via ORAL
  Filled 2016-02-28 (×5): qty 1

## 2016-02-28 MED ORDER — DEXTROSE 5 % IV SOLN
500.0000 mg | Freq: Once | INTRAVENOUS | Status: AC
  Administered 2016-02-28: 500 mg via INTRAVENOUS
  Filled 2016-02-28: qty 500

## 2016-02-28 MED ORDER — ALBUTEROL SULFATE (2.5 MG/3ML) 0.083% IN NEBU: 2.5000 mg | INHALATION_SOLUTION | RESPIRATORY_TRACT | Status: DC | PRN

## 2016-02-28 MED ORDER — SERTRALINE HCL 100 MG PO TABS
100.0000 mg | ORAL_TABLET | Freq: Every day | ORAL | Status: DC
  Administered 2016-02-29 – 2016-03-02 (×3): 100 mg via ORAL
  Filled 2016-02-28 (×3): qty 1

## 2016-02-28 MED ORDER — ENOXAPARIN SODIUM 40 MG/0.4ML ~~LOC~~ SOLN
40.0000 mg | SUBCUTANEOUS | Status: DC
  Administered 2016-02-28 – 2016-03-01 (×3): 40 mg via SUBCUTANEOUS
  Filled 2016-02-28 (×3): qty 0.4

## 2016-02-28 MED ORDER — IPRATROPIUM-ALBUTEROL 0.5-2.5 (3) MG/3ML IN SOLN
3.0000 mL | Freq: Four times a day (QID) | RESPIRATORY_TRACT | Status: DC
  Administered 2016-02-28: 3 mL via RESPIRATORY_TRACT
  Filled 2016-02-28: qty 3

## 2016-02-28 MED ORDER — SERTRALINE HCL 100 MG PO TABS
100.0000 mg | ORAL_TABLET | Freq: Every day | ORAL | Status: DC
  Administered 2016-02-28: 100 mg via ORAL
  Filled 2016-02-28: qty 1

## 2016-02-28 MED ORDER — SODIUM CHLORIDE 0.9 % IV SOLN
INTRAVENOUS | Status: DC
  Administered 2016-02-28: 22:00:00 via INTRAVENOUS

## 2016-02-28 MED ORDER — IPRATROPIUM BROMIDE 0.02 % IN SOLN
0.5000 mg | Freq: Once | RESPIRATORY_TRACT | Status: AC
  Administered 2016-02-28: 0.5 mg via RESPIRATORY_TRACT
  Filled 2016-02-28: qty 2.5

## 2016-02-28 MED ORDER — PANTOPRAZOLE SODIUM 40 MG PO TBEC
40.0000 mg | DELAYED_RELEASE_TABLET | Freq: Every day | ORAL | Status: DC
  Administered 2016-02-28 – 2016-03-02 (×4): 40 mg via ORAL
  Filled 2016-02-28 (×4): qty 1

## 2016-02-28 MED ORDER — METHOCARBAMOL 500 MG PO TABS
500.0000 mg | ORAL_TABLET | Freq: Three times a day (TID) | ORAL | Status: DC
  Administered 2016-02-28 – 2016-03-02 (×9): 500 mg via ORAL
  Filled 2016-02-28 (×9): qty 1

## 2016-02-28 MED ORDER — NICOTINE 21 MG/24HR TD PT24
21.0000 mg | MEDICATED_PATCH | Freq: Every day | TRANSDERMAL | Status: DC
  Administered 2016-02-28 – 2016-03-01 (×4): 21 mg via TRANSDERMAL
  Filled 2016-02-28 (×4): qty 1

## 2016-02-28 MED ORDER — DEXTROSE 5 % IV SOLN
1.0000 g | INTRAVENOUS | Status: DC
  Filled 2016-02-28: qty 10

## 2016-02-28 MED ORDER — BUPROPION HCL 100 MG PO TABS
100.0000 mg | ORAL_TABLET | ORAL | Status: AC
  Administered 2016-02-28: 100 mg via ORAL
  Filled 2016-02-28: qty 1

## 2016-02-28 MED ORDER — BENZONATATE 100 MG PO CAPS
200.0000 mg | ORAL_CAPSULE | Freq: Three times a day (TID) | ORAL | Status: DC | PRN
  Administered 2016-02-29: 200 mg via ORAL
  Filled 2016-02-28: qty 2

## 2016-02-28 MED ORDER — AMLODIPINE BESYLATE 10 MG PO TABS
10.0000 mg | ORAL_TABLET | Freq: Every day | ORAL | Status: DC
  Administered 2016-02-29 – 2016-03-02 (×3): 10 mg via ORAL
  Filled 2016-02-28 (×3): qty 1

## 2016-02-28 MED ORDER — LOSARTAN POTASSIUM 25 MG PO TABS
25.0000 mg | ORAL_TABLET | Freq: Every day | ORAL | Status: DC
  Administered 2016-02-28 – 2016-03-02 (×4): 25 mg via ORAL
  Filled 2016-02-28 (×4): qty 1

## 2016-02-28 MED ORDER — IPRATROPIUM-ALBUTEROL 0.5-2.5 (3) MG/3ML IN SOLN
3.0000 mL | Freq: Once | RESPIRATORY_TRACT | Status: AC
  Administered 2016-02-28: 3 mL via RESPIRATORY_TRACT
  Filled 2016-02-28: qty 3

## 2016-02-28 MED ORDER — BUPROPION HCL 75 MG PO TABS
75.0000 mg | ORAL_TABLET | Freq: Two times a day (BID) | ORAL | Status: DC
  Administered 2016-02-28 – 2016-03-02 (×6): 75 mg via ORAL
  Filled 2016-02-28 (×8): qty 1

## 2016-02-28 MED ORDER — PREGABALIN 75 MG PO CAPS
150.0000 mg | ORAL_CAPSULE | Freq: Two times a day (BID) | ORAL | Status: DC
  Administered 2016-02-28 – 2016-03-02 (×6): 150 mg via ORAL
  Filled 2016-02-28 (×6): qty 2

## 2016-02-28 MED ORDER — METFORMIN HCL 500 MG PO TABS
500.0000 mg | ORAL_TABLET | Freq: Once | ORAL | Status: AC
  Administered 2016-02-28: 500 mg via ORAL
  Filled 2016-02-28: qty 1

## 2016-02-28 MED ORDER — PREGABALIN 75 MG PO CAPS
75.0000 mg | ORAL_CAPSULE | ORAL | Status: AC
  Administered 2016-02-28: 75 mg via ORAL

## 2016-02-28 MED ORDER — ATORVASTATIN CALCIUM 20 MG PO TABS
20.0000 mg | ORAL_TABLET | Freq: Every day | ORAL | Status: DC
  Administered 2016-02-29 – 2016-03-02 (×3): 20 mg via ORAL
  Filled 2016-02-28 (×3): qty 1

## 2016-02-28 MED ORDER — HYDROCOD POLST-CPM POLST ER 10-8 MG/5ML PO SUER
5.0000 mL | Freq: Once | ORAL | Status: AC
  Administered 2016-02-28: 5 mL via ORAL
  Filled 2016-02-28: qty 5

## 2016-02-28 MED ORDER — SENNOSIDES-DOCUSATE SODIUM 8.6-50 MG PO TABS
1.0000 | ORAL_TABLET | Freq: Every day | ORAL | Status: DC
  Administered 2016-02-29 – 2016-03-02 (×3): 1 via ORAL
  Filled 2016-02-28 (×4): qty 1

## 2016-02-28 MED ORDER — ALBUTEROL SULFATE (2.5 MG/3ML) 0.083% IN NEBU
5.0000 mg | INHALATION_SOLUTION | Freq: Once | RESPIRATORY_TRACT | Status: AC
  Administered 2016-02-28: 5 mg via RESPIRATORY_TRACT
  Filled 2016-02-28: qty 6

## 2016-02-28 MED ORDER — METHYLPREDNISOLONE SODIUM SUCC 125 MG IJ SOLR
125.0000 mg | Freq: Once | INTRAMUSCULAR | Status: AC
  Administered 2016-02-28: 125 mg via INTRAVENOUS
  Filled 2016-02-28: qty 2

## 2016-02-28 MED ORDER — ZOLPIDEM TARTRATE 5 MG PO TABS
5.0000 mg | ORAL_TABLET | Freq: Once | ORAL | Status: AC
  Administered 2016-02-28: 5 mg via ORAL
  Filled 2016-02-28: qty 1

## 2016-02-28 MED ORDER — ACETAMINOPHEN 650 MG RE SUPP: 650.0000 mg | Freq: Four times a day (QID) | RECTAL | Status: DC | PRN

## 2016-02-28 MED ORDER — INSULIN ASPART 100 UNIT/ML ~~LOC~~ SOLN
0.0000 [IU] | Freq: Three times a day (TID) | SUBCUTANEOUS | Status: DC
  Administered 2016-02-29 (×2): 2 [IU] via SUBCUTANEOUS
  Administered 2016-02-29 – 2016-03-01 (×3): 1 [IU] via SUBCUTANEOUS
  Administered 2016-03-01: 2 [IU] via SUBCUTANEOUS
  Administered 2016-03-02: 3 [IU] via SUBCUTANEOUS
  Administered 2016-03-02: 2 [IU] via SUBCUTANEOUS

## 2016-02-28 MED ORDER — ALBUTEROL SULFATE (2.5 MG/3ML) 0.083% IN NEBU: 2.5000 mg | INHALATION_SOLUTION | Freq: Once | RESPIRATORY_TRACT | Status: DC

## 2016-02-28 MED ORDER — KETOROLAC TROMETHAMINE 30 MG/ML IJ SOLN
15.0000 mg | Freq: Once | INTRAMUSCULAR | Status: AC
  Administered 2016-02-28: 12:00:00 via INTRAVENOUS
  Filled 2016-02-28: qty 1

## 2016-02-28 MED ORDER — METHYLPREDNISOLONE SODIUM SUCC 125 MG IJ SOLR
60.0000 mg | Freq: Three times a day (TID) | INTRAMUSCULAR | Status: DC
  Administered 2016-02-28 – 2016-03-01 (×5): 60 mg via INTRAVENOUS
  Filled 2016-02-28 (×5): qty 2

## 2016-02-28 MED ORDER — HYDROCOD POLST-CPM POLST ER 10-8 MG/5ML PO SUER
5.0000 mL | Freq: Two times a day (BID) | ORAL | Status: DC | PRN
  Administered 2016-02-28 – 2016-03-02 (×6): 5 mL via ORAL
  Filled 2016-02-28 (×6): qty 5

## 2016-02-28 MED ORDER — ACETAMINOPHEN 325 MG PO TABS: 650.0000 mg | ORAL_TABLET | Freq: Four times a day (QID) | ORAL | Status: DC | PRN

## 2016-02-28 MED ORDER — IPRATROPIUM-ALBUTEROL 0.5-2.5 (3) MG/3ML IN SOLN
3.0000 mL | Freq: Once | RESPIRATORY_TRACT | Status: DC
  Filled 2016-02-28: qty 3

## 2016-02-28 MED ORDER — AMLODIPINE BESYLATE 5 MG PO TABS
10.0000 mg | ORAL_TABLET | Freq: Once | ORAL | Status: AC
  Administered 2016-02-28: 10 mg via ORAL
  Filled 2016-02-28: qty 2

## 2016-02-28 MED ORDER — LORAZEPAM 1 MG PO TABS
0.5000 mg | ORAL_TABLET | Freq: Once | ORAL | Status: AC
  Administered 2016-02-28: 0.5 mg via ORAL
  Filled 2016-02-28: qty 1

## 2016-02-28 MED ORDER — ALBUTEROL (5 MG/ML) CONTINUOUS INHALATION SOLN
10.0000 mg/h | INHALATION_SOLUTION | Freq: Once | RESPIRATORY_TRACT | Status: AC
  Administered 2016-02-28: 10 mg/h via RESPIRATORY_TRACT
  Filled 2016-02-28: qty 20

## 2016-02-28 MED ORDER — DEXTROSE 5 % IV SOLN
1.0000 g | Freq: Once | INTRAVENOUS | Status: AC
  Administered 2016-02-28: 1 g via INTRAVENOUS
  Filled 2016-02-28: qty 10

## 2016-02-28 MED ORDER — DEXTROSE 5 % IV SOLN
500.0000 mg | INTRAVENOUS | Status: DC
  Filled 2016-02-28: qty 500

## 2016-02-28 MED ORDER — SODIUM CHLORIDE 0.9% FLUSH
3.0000 mL | Freq: Two times a day (BID) | INTRAVENOUS | Status: DC
  Administered 2016-02-28 – 2016-03-02 (×5): 3 mL via INTRAVENOUS

## 2016-02-28 NOTE — Discharge Planning (Signed)
EDCM searched [employer] for discount on medications being prescribed and found that pt payment would come to approximately $20.  Presented pt with [employer] discount card.

## 2016-02-28 NOTE — H&P (Signed)
History and Physical    [name] [id number] DOB: 09-30-1960 DOA: 02/28/2016   PCP: Pcp Not In System    Patient coming from: Home  Chief Complaint: Cough and dyspnea  HPI: [name] is a 56 y.o. female, separated, recently moved from [place] and lives with her son and grandchildren, states that she ambulates with the help of walker due to gait instability, PMH of tobacco abuse, GERD, HTN, PTSD/? Anxiety & depression, 2 falls in 2017 (January and August) with? Syncope (has appointment to see outpatient cardiology), presented to the ED with complaints of worsening dyspnea and nonproductive cough. She was in her usual state of health until a week after Christmas when she started having nonproductive cough, chills without fevers, decreased appetite and weakness. She was exposed to her grandchildren who had fever of 102F and diarrhea, during Christmas. She briefly had a night of diarrhea several days ago which has resolved. She was seen in the ED on 02/24/15 for COPD exacerbation and atypical chest x-ray. Thought to be influenza versus pneumonia and she was discharged home on levofloxacin, prednisone and Hycodan but she was unable to fill these prescriptions for financial reasons. She progressively got worse, was seen at her new PCPs office and given IM Rocephin, nebulization treatments and sent to the ED.  ED Course: Unremarkable labs. Chest x-ray shows right nasal atelectasis without edema or consolidation. She was given a dose of IV Rocephin, azithromycin and Solu-Medrol. Despite treatment, she continued to have dyspnea and wheezing then desaturated to 89% on room air while ambulating. Hospitalist admission was requested.  Review of Systems:  All other systems reviewed and apart from HPI, are negative.  Past Medical History:  Diagnosis Date  . GERD (gastroesophageal reflux disease)   . Hypertension   . PTSD (post-traumatic stress disorder)   . Renal disorder      Past Surgical History:  Procedure Laterality Date  . c-section     . URETHROPLASTY     Social history  reports that she has been smoking.  She has never used smokeless tobacco. She reports that she drinks alcohol. She reports that she does not use drugs.  Allergies  Allergen Reactions  . Asa [Aspirin] Other (See Comments)    Stomach burns   . Morphine And Related Itching  . Oxycodone     "it makes me feel crazy"    History reviewed. No pertinent family history.   Prior to Admission medications   Medication Sig Start Date End Date Taking? Authorizing Provider  amLODipine (NORVASC) 10 MG tablet Take 10 mg by mouth daily.   Yes Historical Provider, MD  atorvastatin (LIPITOR) 20 MG tablet Take 20 mg by mouth daily.   Yes Historical Provider, MD  buPROPion (WELLBUTRIN) 75 MG tablet Take 75 mg by mouth 2 (two) times daily.   Yes Historical Provider, MD  clotrimazole-betamethasone (LOTRISONE) cream Apply 1 application topically at bedtime.   Yes Historical Provider, MD  HYDROcodone-homatropine (HYCODAN) 5-1.5 MG/5ML syrup Take 5 mLs by mouth every 6 (six) hours as needed for cough. 02/24/16  Yes Duffy Bruce, MD  levofloxacin (LEVAQUIN) 750 MG tablet Take 1 tablet (750 mg total) by mouth daily. 02/24/16 03/02/16 Yes Duffy Bruce, MD  lidocaine (LIDODERM) 5 % Place 1 patch onto the skin daily. Remove & Discard patch within 12 hours or as directed by MD   Yes Historical Provider, MD  LORazepam (ATIVAN) 0.5 MG tablet Take 0.5 mg by mouth every 8 (eight) hours as needed for anxiety.  Yes Historical Provider, MD  losartan (COZAAR) 25 MG tablet Take 25 mg by mouth daily.   Yes Historical Provider, MD  metFORMIN (GLUCOPHAGE) 500 MG tablet Take 500 mg by mouth 2 (two) times daily with a meal.   Yes Historical Provider, MD  methocarbamol (ROBAXIN) 500 MG tablet Take 500 mg by mouth 3 (three) times daily.   Yes Historical Provider, MD  pantoprazole (PROTONIX) 40 MG tablet Take 40 mg by mouth  daily.   Yes Historical Provider, MD  predniSONE (DELTASONE) 20 MG tablet Take 2 tablets (40 mg total) by mouth daily. 02/24/16 02/29/16 Yes Duffy Bruce, MD  pregabalin (LYRICA) 75 MG capsule Take 150 mg by mouth 2 (two) times daily.   Yes Historical Provider, MD  sennosides-docusate sodium (SENOKOT-S) 8.6-50 MG tablet Take 1 tablet by mouth daily.   Yes Historical Provider, MD  sertraline (ZOLOFT) 100 MG tablet Take 100 mg by mouth daily.   Yes Historical Provider, MD    Physical Exam: Vitals:   02/28/16 1450 02/28/16 1500 02/28/16 1515 02/28/16 1800  BP: 144/82 135/85 134/97 136/82  Pulse: 100 107 100 111  Resp: 22  15 15   Temp:      TempSrc:      SpO2: 96% 97% 98% 96%      Constitutional: Pleasant middle-aged female, moderately built and nourished, lying comfortably propped up in the gurney in the ED. Does not appear in respiratory distress. Eyes: PERTLA, lids and conjunctivae normal ENMT: Mucous membranes are slightly dry. Posterior pharynx clear of any exudate or lesions. Normal dentition.  Neck: normal, supple, no masses, no thyromegaly Respiratory: Reduced breath sounds bilaterally with scattered few bilateral medium pitched expiratory rhonchi but no crackles. No increased work of breathing. Able to speak in full sentences.  Cardiovascular: S1 & S2 heard, regular rate and rhythm, no murmurs / rubs / gallops. No extremity edema. 2+ pedal pulses. No carotid bruits.  Abdomen: No distension, no tenderness, no masses palpated. No hepatosplenomegaly. Bowel sounds normal.  Musculoskeletal: no clubbing / cyanosis. No joint deformity upper and lower extremities. Good ROM, no contractures. Normal muscle tone.  Skin: no rashes, lesions, ulcers. No induration Neurologic: CN 2-12 grossly intact. Sensation intact, DTR normal. Strength 5/5 in all 4 limbs.  Psychiatric: Normal judgment and insight. Alert and oriented x 3. Appears anxious.     Labs on Admission: I have personally reviewed  following labs and imaging studies  CBC:  Recent Labs Lab 02/24/16 0954 02/28/16 1045  WBC 5.3 8.2  NEUTROABS 2.4 3.5  HGB 11.8* 12.2  HCT 35.2* 36.2  MCV 90.7 88.7  PLT 250 [id number]   Basic Metabolic Panel:  Recent Labs Lab 02/24/16 0954 02/28/16 1045  NA 142 138  K 3.1* 3.7  CL 108 105  CO2 26 23  GLUCOSE 129* 126*  BUN 9 13  CREATININE 0.88 0.89  CALCIUM 9.6 9.7   Liver Function Tests:  Recent Labs Lab 02/24/16 0954  AST 29  ALT 22  ALKPHOS 128*  BILITOT 0.3  PROT 7.4  ALBUMIN 4.3     Radiological Exams on Admission: Dg Chest 2 View  Result Date: 02/28/2016 CLINICAL DATA:  Shortness of breath and cough EXAM: CHEST  2 VIEW COMPARISON:  February 24, 2016 FINDINGS: There is mild atelectatic change in the right base. Lungs elsewhere are clear. Heart size and pulmonary vascularity are normal. There is atherosclerotic calcification in the aorta. No adenopathy. No bone lesions. IMPRESSION: Right base atelectasis. No edema or consolidation. Aortic atherosclerosis.  Electronically Signed   By: Lowella Grip III M.D.   On: 02/28/2016 11:15    EKG: Independently reviewed. Sinus rhythm without acute changes.  Assessment/Plan Principal Problem:   COPD exacerbation (HCC) Active Problems:   Tobacco abuse   GERD (gastroesophageal reflux disease)   Essential hypertension   Acute bronchitis      1. Acute bronchitis with COPD exacerbation: Likely viral etiology. In the absence of fevers, leukocytosis, productive cough and chest x-ray suggesting right basilar atelectasis, pneumonia felt less likely. Check pro calcitonin. For now continue IV Rocephin and azithromycin but if pro calcitonin not significantly elevated, consider oral Z-Pak. Continue oxygen, bronchodilator nebulizations, IV Solu-Medrol, incentive spirometry and flutter valve. Tobacco cessation counseled. 2. Acute respiratory failure with hypoxia: Secondary to problem #1. Treatment as above and  monitor. 3. Essential hypertension: Continue amlodipine and Cozaar. 4. Hyperlipidemia: Continue atorvastatin 5. PTSD/? Anxiety & depression: Continue home medications. 6. Type II DM: Hold oral hypoglycemics. SSI. 7. GERD: Continue PPI. 8. Tobacco abuse: Cessation counseled. Nicotine patch.   DVT prophylaxis: Lovenox  Code Status: Full  Family Communication: None at bedside  Disposition Plan: DC home when medically stable  Consults called: None  Admission status: Telemetry, observation    [facility] MD [facility] Pager [phone]  If 7PM-7AM, please contact night-coverage [email or web address] Password [facility]  02/28/2016, 6:36 PM

## 2016-02-28 NOTE — ED Triage Notes (Signed)
CO of SOB. Diagnosed with PNA here a few days ago and states she cant afford her medications. Received 5 albuterol, 1 atrovent, and 125 solumedrol at urgent care. On 4L with sats at 98%.

## 2016-02-28 NOTE — ED Notes (Signed)
Pt ambulated to bathroom, reports dizziness while ambulating.  O2 dropped to 89-90% with pt on room air, upon return to room and in bed, O2 rises to 98%.

## 2016-02-28 NOTE — Discharge Planning (Signed)
EDCM consulted in regards to medicaction assistance.  Pt has insurance coverage and is not eligible for [facility] program.

## 2016-02-28 NOTE — ED Provider Notes (Signed)
[name] Provider Note   CSN: [id number] Arrival date & time: 02/28/16  1023     History   Chief Complaint Chief Complaint  Patient presents with  . Shortness of Breath    HPI [name] is a 56 y.o. female.  HPI   Pt with hx HTN, smoking p/w worsening SOB, wheezing, nonproductive cough that began 02/22/15 while taking care of grandchildren with similar symptoms. Was seen in ED 02/24/15 with COPD exacerbation, wheezing, atypical pneumonia on chest xray.  Thought to be influenza vs pneumonia, d/c home with levaquin, prednisone, hycodan.  Pt was unable to fill any prescriptions for financial reasons, was not taking any OTC medications either.  Has progressively worsened.  Denies any fevers, nasal or throat symptoms currently.  Was seen at PCP this morning, [facility], given IM steroids (125mg  solu-medrol) and neb treatment then sent to the ED.  Was requiring 4L Eugenio Saenz by EMS to maintain O2 sat 98%.  She is not on chronic oxygen at home.    Past Medical History:  Diagnosis Date  . GERD (gastroesophageal reflux disease)   . Hypertension   . PTSD (post-traumatic stress disorder)   . Renal disorder     Patient Active Problem List   Diagnosis Date Noted  . COPD exacerbation ([place]) 02/28/2016    Past Surgical History:  Procedure Laterality Date  . c-section     . URETHROPLASTY      OB History    No data available       Home Medications    Prior to Admission medications   Medication Sig Start Date End Date Taking? Authorizing Provider  amLODipine (NORVASC) 10 MG tablet Take 10 mg by mouth daily.   Yes Historical Provider, MD  atorvastatin (LIPITOR) 20 MG tablet Take 20 mg by mouth daily.   Yes Historical Provider, MD  buPROPion (WELLBUTRIN) 75 MG tablet Take 75 mg by mouth 2 (two) times daily.   Yes Historical Provider, MD  clotrimazole-betamethasone (LOTRISONE) cream Apply 1 application topically at bedtime.   Yes Historical Provider, MD    HYDROcodone-homatropine (HYCODAN) 5-1.5 MG/5ML syrup Take 5 mLs by mouth every 6 (six) hours as needed for cough. 02/24/16  Yes Duffy Bruce, MD  levofloxacin (LEVAQUIN) 750 MG tablet Take 1 tablet (750 mg total) by mouth daily. 02/24/16 03/02/16 Yes Duffy Bruce, MD  lidocaine (LIDODERM) 5 % Place 1 patch onto the skin daily. Remove & Discard patch within 12 hours or as directed by MD   Yes Historical Provider, MD  LORazepam (ATIVAN) 0.5 MG tablet Take 0.5 mg by mouth every 8 (eight) hours as needed for anxiety.   Yes Historical Provider, MD  losartan (COZAAR) 25 MG tablet Take 25 mg by mouth daily.   Yes Historical Provider, MD  metFORMIN (GLUCOPHAGE) 500 MG tablet Take 500 mg by mouth 2 (two) times daily with a meal.   Yes Historical Provider, MD  methocarbamol (ROBAXIN) 500 MG tablet Take 500 mg by mouth 3 (three) times daily.   Yes Historical Provider, MD  pantoprazole (PROTONIX) 40 MG tablet Take 40 mg by mouth daily.   Yes Historical Provider, MD  predniSONE (DELTASONE) 20 MG tablet Take 2 tablets (40 mg total) by mouth daily. 02/24/16 02/29/16 Yes Duffy Bruce, MD  pregabalin (LYRICA) 75 MG capsule Take 150 mg by mouth 2 (two) times daily.   Yes Historical Provider, MD  sennosides-docusate sodium (SENOKOT-S) 8.6-50 MG tablet Take 1 tablet by mouth daily.   Yes Historical Provider, MD  sertraline (ZOLOFT) 100 MG tablet Take 100 mg by mouth daily.   Yes Historical Provider, MD    Family History No family history on file.  Social History Social History  Substance Use Topics  . Smoking status: Current Some Day Smoker  . Smokeless tobacco: Never Used  . Alcohol use Yes     Allergies   Asa [aspirin]; Morphine and related; and Oxycodone   Review of Systems Review of Systems  All other systems reviewed and are negative.    Physical Exam Updated Vital Signs BP 134/97   Pulse 100   Temp 98.2 F (36.8 C) (Oral)   Resp 15   LMP  (LMP Unknown)   SpO2 98%   Physical Exam   Constitutional: She appears well-developed and well-nourished. No distress.  HENT:  Head: Normocephalic and atraumatic.  Neck: Neck supple.  Cardiovascular: Normal rate and regular rhythm.   Pulmonary/Chest: Effort normal. No respiratory distress. She has wheezes. She has rales.  coughing  Abdominal: Soft. She exhibits no distension. There is no tenderness. There is no rebound and no guarding.  Musculoskeletal: She exhibits no edema.  Neurological: She is alert.  Skin: She is not diaphoretic.  Nursing note and vitals reviewed.    ED Treatments / Results  Labs (all labs ordered are listed, but only abnormal results are displayed) Labs Reviewed  BASIC METABOLIC PANEL - Abnormal; Notable for the following:       Result Value   Glucose, Bld 126 (*)    All other components within normal limits  CBC WITH DIFFERENTIAL/PLATELET  PROCALCITONIN    EKG  EKG Interpretation  Date/Time:  Friday February 28 2016 10:31:27 EST Ventricular Rate:  78 PR Interval:    QRS Duration: 76 QT Interval:  401 QTC Calculation: 457 R Axis:   84 Text Interpretation:  Sinus rhythm Artifact in lead(s) V6 and baseline wander in lead(s) V6 no chnage from previous Confirmed by Johnney Killian, MD, Jeannie Done [phone]) on 02/28/2016 10:48:21 AM       Radiology Dg Chest 2 View  Result Date: 02/28/2016 CLINICAL DATA:  Shortness of breath and cough EXAM: CHEST  2 VIEW COMPARISON:  February 24, 2016 FINDINGS: There is mild atelectatic change in the right base. Lungs elsewhere are clear. Heart size and pulmonary vascularity are normal. There is atherosclerotic calcification in the aorta. No adenopathy. No bone lesions. IMPRESSION: Right base atelectasis. No edema or consolidation. Aortic atherosclerosis. Electronically Signed   By: Lowella Grip III M.D.   On: 02/28/2016 11:15    Procedures Procedures (including critical care time)  Medications Ordered in ED Medications  sertraline (ZOLOFT) tablet 100 mg (100 mg Oral  Given 02/28/16 1447)  albuterol (PROVENTIL) (2.5 MG/3ML) 0.083% nebulizer solution 5 mg (5 mg Nebulization Given 02/28/16 1130)  cefTRIAXone (ROCEPHIN) 1 g in dextrose 5 % 50 mL IVPB (0 g Intravenous Stopped 02/28/16 1215)  azithromycin (ZITHROMAX) 500 mg in dextrose 5 % 250 mL IVPB (0 mg Intravenous Stopped 02/28/16 1330)  ipratropium (ATROVENT) nebulizer solution 0.5 mg (0.5 mg Nebulization Given 02/28/16 1130)  ketorolac (TORADOL) 30 MG/ML injection 15 mg ( Intravenous Given 02/28/16 1216)  albuterol (PROVENTIL,VENTOLIN) solution continuous neb (10 mg/hr Nebulization Given 02/28/16 1300)  ipratropium (ATROVENT) nebulizer solution 0.5 mg (0.5 mg Nebulization Given 02/28/16 1300)  amLODipine (NORVASC) tablet 10 mg (10 mg Oral Given 02/28/16 1328)  metFORMIN (GLUCOPHAGE) tablet 500 mg (500 mg Oral Given 02/28/16 1328)  buPROPion (WELLBUTRIN) tablet 100 mg (100 mg Oral Given 02/28/16 1447)  pregabalin (  LYRICA) capsule 75 mg (75 mg Oral Given 02/28/16 1447)  LORazepam (ATIVAN) tablet 0.5 mg (0.5 mg Oral Given 02/28/16 1329)  chlorpheniramine-HYDROcodone (TUSSIONEX) 10-8 MG/5ML suspension 5 mL (5 mLs Oral Given 02/28/16 1530)  ipratropium-albuterol (DUONEB) 0.5-2.5 (3) MG/3ML nebulizer solution 3 mL (3 mLs Nebulization Given 02/28/16 1701)  methylPREDNISolone sodium succinate (SOLU-MEDROL) 125 mg/2 mL injection 125 mg (125 mg Intravenous Given 02/28/16 1701)     Initial Impression / Assessment and Plan / ED Course  I have reviewed the triage vital signs and the nursing notes.  Pertinent labs & imaging results that were available during my care of the patient were reviewed by me and considered in my medical decision making (see chart for details).  Clinical Course as of Feb 27 1757  Fri Feb 28, 2016  1237 Continued wheezing but improved O2 saturation.  Will decrease O2 [place] and give hour-long neb treatment.  Pt receiving abx.  Pt also requests her morning doses of home medications.   [EW]  1535 Continued  wheezing.  O2 normal on room air now but desat to 89% while ambulating.    [EW]    Clinical Course User Index [EW] Clayton Bibles, PA-C    Afebrile patient with 6 days of cough, wheezing, SOB, seen in ED 02/24/16, diagnosed with atypical pneumonia, d/c home with antibiotics, steroids, antitussive which pt was unable to fill.  Returns with worsened symptoms, sent from PCP.  EMS reported pt was given solu medrol and nebs at PCP though it appears from record review that she was actually given rocephin IM and nebs.  Pt given multiple nebs here and steroids once I found the PCP record.  Also rocephin and azithromycin initially ordered given the prior finding of atypical pneumonia on chest xray.  Labs  Today significant only for hyperglycemia.  CXR does not demonstrate focal consolidation.  Despite treatment pt continued to wheeze and desat to 89% while ambulating.  Admitted to [facility] for continued treatment overnight.  Dr Algis Liming accepting.    Final Clinical Impressions(s) / ED Diagnoses   Final diagnoses:  COPD exacerbation [facility])    New Prescriptions New Prescriptions   No medications on file     Clayton Bibles, PA-C 02/28/16 [place], MD 03/03/16 [phone]

## 2016-02-29 DIAGNOSIS — Z885 Allergy status to narcotic agent status: Secondary | ICD-10-CM | POA: Diagnosis not present

## 2016-02-29 DIAGNOSIS — J209 Acute bronchitis, unspecified: Secondary | ICD-10-CM | POA: Diagnosis present

## 2016-02-29 DIAGNOSIS — Z886 Allergy status to analgesic agent status: Secondary | ICD-10-CM | POA: Diagnosis not present

## 2016-02-29 DIAGNOSIS — J9601 Acute respiratory failure with hypoxia: Secondary | ICD-10-CM | POA: Diagnosis present

## 2016-02-29 DIAGNOSIS — E785 Hyperlipidemia, unspecified: Secondary | ICD-10-CM | POA: Diagnosis present

## 2016-02-29 DIAGNOSIS — F329 Major depressive disorder, single episode, unspecified: Secondary | ICD-10-CM | POA: Diagnosis present

## 2016-02-29 DIAGNOSIS — J440 Chronic obstructive pulmonary disease with acute lower respiratory infection: Secondary | ICD-10-CM | POA: Diagnosis present

## 2016-02-29 DIAGNOSIS — K219 Gastro-esophageal reflux disease without esophagitis: Secondary | ICD-10-CM

## 2016-02-29 DIAGNOSIS — Z7952 Long term (current) use of systemic steroids: Secondary | ICD-10-CM | POA: Diagnosis not present

## 2016-02-29 DIAGNOSIS — I10 Essential (primary) hypertension: Secondary | ICD-10-CM | POA: Diagnosis present

## 2016-02-29 DIAGNOSIS — F17200 Nicotine dependence, unspecified, uncomplicated: Secondary | ICD-10-CM | POA: Diagnosis present

## 2016-02-29 DIAGNOSIS — J441 Chronic obstructive pulmonary disease with (acute) exacerbation: Secondary | ICD-10-CM | POA: Diagnosis present

## 2016-02-29 DIAGNOSIS — Z720 Tobacco use: Secondary | ICD-10-CM | POA: Diagnosis not present

## 2016-02-29 DIAGNOSIS — F4310 Post-traumatic stress disorder, unspecified: Secondary | ICD-10-CM | POA: Diagnosis present

## 2016-02-29 DIAGNOSIS — R0602 Shortness of breath: Secondary | ICD-10-CM | POA: Diagnosis present

## 2016-02-29 DIAGNOSIS — E119 Type 2 diabetes mellitus without complications: Secondary | ICD-10-CM | POA: Diagnosis present

## 2016-02-29 DIAGNOSIS — Z888 Allergy status to other drugs, medicaments and biological substances status: Secondary | ICD-10-CM | POA: Diagnosis not present

## 2016-02-29 LAB — GLUCOSE, CAPILLARY
GLUCOSE-CAPILLARY: 181 mg/dL — AB (ref 65–99)
Glucose-Capillary: 144 mg/dL — ABNORMAL HIGH (ref 65–99)
Glucose-Capillary: 193 mg/dL — ABNORMAL HIGH (ref 65–99)
Glucose-Capillary: 194 mg/dL — ABNORMAL HIGH (ref 65–99)

## 2016-02-29 MED ORDER — IBUPROFEN 600 MG PO TABS
600.0000 mg | ORAL_TABLET | Freq: Four times a day (QID) | ORAL | Status: AC | PRN
  Administered 2016-02-29: 600 mg via ORAL
  Filled 2016-02-29: qty 1

## 2016-02-29 MED ORDER — ZOLPIDEM TARTRATE 5 MG PO TABS
5.0000 mg | ORAL_TABLET | Freq: Every evening | ORAL | Status: DC | PRN
  Administered 2016-03-01 (×2): 5 mg via ORAL
  Filled 2016-02-29 (×2): qty 1

## 2016-02-29 MED ORDER — BENZONATATE 100 MG PO CAPS
200.0000 mg | ORAL_CAPSULE | Freq: Three times a day (TID) | ORAL | Status: DC
  Administered 2016-02-29 – 2016-03-02 (×8): 200 mg via ORAL
  Filled 2016-02-29 (×8): qty 2

## 2016-02-29 NOTE — Evaluation (Signed)
Physical Therapy Evaluation Patient Details Name: [name] MRN: [id number] DOB: 1960-09-22 Today's Date: 02/29/2016   History of Present Illness  Pt is a 56 y/o female admitted secondary to acute brochitis. PMH including but not limited to tobacco abuse, HTN and PTSD.  Clinical Impression  Pt presented supine in bed with HOB elevated, awake and willing to participate in therapy session. Prior to admission, pt reported that she was independent with all functional mobility and ADLs. Pt ambulated 76' with RW and min guard for safety on RA with SPO2 maintaining >93% throughout. Pt would continue to benefit from skilled physical therapy services at this time while admitted to address her below listed limitations in order to improve her overall safety and independence with functional mobility.      Follow Up Recommendations No PT follow up;Supervision for mobility/OOB    Equipment Recommendations  Rolling walker with 5" wheels    Recommendations for Other Services       Precautions / Restrictions Precautions Precautions: None Restrictions Weight Bearing Restrictions: No      Mobility  Bed Mobility Overal bed mobility: Needs Assistance Bed Mobility: Supine to Sit;Sit to Supine     Supine to sit: Supervision;HOB elevated Sit to supine: Supervision   General bed mobility comments: pt required increased time, use of bed rails and supervision for safety  Transfers Overall transfer level: Needs assistance Equipment used: None Transfers: Sit to/from Stand Sit to Stand: Min guard         General transfer comment: pt required increased time, min guard for safety  Ambulation/Gait Ambulation/Gait assistance: Min guard Ambulation Distance (Feet): 50 Feet Assistive device: Rolling walker (2 wheeled) Gait Pattern/deviations: Step-through pattern;Decreased stride length Gait velocity: decreased Gait velocity interpretation: Below normal speed for age/gender General Gait  Details: mild instability but no LOB or need for physical assistance, min guard for safety  Stairs            Wheelchair Mobility    Modified Rankin (Stroke Patients Only)       Balance Overall balance assessment: Needs assistance Sitting-balance support: Feet unsupported;No upper extremity supported Sitting balance-Leahy Scale: Good     Standing balance support: During functional activity;No upper extremity supported Standing balance-Leahy Scale: Fair                               Pertinent Vitals/Pain Pain Assessment: No/denies pain    Home Living Family/patient expects to be discharged to:: Private residence Living Arrangements: Children Available Help at Discharge: Family;Available PRN/intermittently Type of Home: House ([place]) Home Access: Stairs to enter Entrance Stairs-Rails: Psychiatric nurse of Steps: 6 Home Layout: Two level Home Equipment: None      Prior Function Level of Independence: Independent               Hand Dominance        Extremity/Trunk Assessment   Upper Extremity Assessment Upper Extremity Assessment: Generalized weakness    Lower Extremity Assessment Lower Extremity Assessment: Generalized weakness    Cervical / Trunk Assessment Cervical / Trunk Assessment: Normal  Communication   Communication: No difficulties  Cognition Arousal/Alertness: Awake/alert Behavior During Therapy: WFL for tasks assessed/performed Overall Cognitive Status: Within Functional Limits for tasks assessed                      General Comments      Exercises     Assessment/Plan    PT  Assessment Patient needs continued PT services  PT Problem List Decreased strength;Decreased activity tolerance;Decreased balance;Decreased mobility;Decreased coordination;Decreased knowledge of use of DME;Decreased safety awareness;Cardiopulmonary status limiting activity          PT Treatment Interventions DME  instruction;Gait training;Stair training;Functional mobility training;Therapeutic activities;Therapeutic exercise;Balance training;Neuromuscular re-education;Patient/family education    PT Goals (Current goals can be found in the Care Plan section)  Acute Rehab PT Goals Patient Stated Goal: finish her homework PT Goal Formulation: With patient Time For Goal Achievement: 03/14/16 Potential to Achieve Goals: Good    Frequency Min 3X/week   Barriers to discharge        Co-evaluation               End of Session Equipment Utilized During Treatment: Gait belt Activity Tolerance: Patient limited by fatigue Patient left: in bed;with call bell/phone within reach Nurse Communication: Mobility status         Time: [id number] PT Time Calculation (min) (ACUTE ONLY): 19 min   Charges:   PT Evaluation $PT Eval Moderate Complexity: 1 Procedure     PT G CodesClearnce Sorrel Selmer Adduci 02/29/2016, 12:09 PM Sherie Don, Ricardo, DPT [phone]

## 2016-02-29 NOTE — Progress Notes (Signed)
Patient arrived to the unit, complained of chest pain that was "muscular, from coughing". Patient oriented to unit routines. Call bell in reach.

## 2016-02-29 NOTE — Progress Notes (Signed)
Walked in the room and smelled a light cigarette smell. Asked patient about it and she states "I had a short in my purse and it was making it stink, so I threw it out". Will continue to keep an eye on patient.

## 2016-02-29 NOTE — Progress Notes (Signed)
Patient says breathing is getting worse, but she is in the process of smoking cessation. Patient gets very winded with ambulation and thinks she may need oxygen upon discharge.

## 2016-02-29 NOTE — Progress Notes (Addendum)
PROGRESS NOTE  [name]  [id number] DOB: [birth year]  DOA: 02/28/2016 PCP: Pcp Not In System   Brief Narrative:  56 y.o. female, separated, recently moved from [place] and lives with her son and grandchildren, states that she ambulates with the help of walker due to gait instability, PMH of tobacco abuse, GERD, HTN, PTSD/? Anxiety & depression, 2 falls in 2017 (January and August) with? Syncope (has appointment to see outpatient cardiology), presented to the ED with complaints of worsening dyspnea and nonproductive cough. Admitted for acute bronchitis with COPD exacerbation, acute respiratory failure with hypoxia and low index of suspicion for pneumonia.   Assessment & Plan:   Principal Problem:   COPD exacerbation ([place]) Active Problems:   Tobacco abuse   GERD (gastroesophageal reflux disease)   Essential hypertension   Acute bronchitis   1. Acute bronchitis with COPD exacerbation: Likely viral etiology. In the absence of fevers, leukocytosis, productive cough and chest x-ray suggesting right basilar atelectasis, pneumonia felt less likely. Pro calcitonin < 0.10. Received IV Rocephin and azithromycin in ED. Discontinue all antibiotics and monitor off of them. Continue oxygen, bronchodilator nebulizations, IV Solu-Medrol, incentive spirometry and flutter valve. Tobacco cessation counseled. Slowly improving. Changed Tessalon to scheduled for cough suppression. Flutter valve and incentive spirometry. Recommend repeating chest x-ray in 3-4 weeks to ensure resolution of noted atelectasis. 2. Acute respiratory failure with hypoxia: Secondary to problem #1. Treatment as above and monitor. Hypoxia seems to have resolved. As per PT, oxygen saturations remained >93% on ambulation on room air. 3. Essential hypertension: Continue amlodipine and Cozaar. Controlled. 4. Hyperlipidemia: Continue atorvastatin 5. PTSD/? Anxiety & depression: Continue home medications. 6. Type II DM: Hold  oral hypoglycemics. SSI. Mildly uncontrolled. Check A1c. 7. GERD: Continue PPI. 8. Tobacco abuse: Cessation counseled. Nicotine patch. 9. Reported history of falls (last in August 2017), gait instability: PT evaluation appreciated and recommend no PT follow-up but rolling walker-will order at discharge.   DVT prophylaxis: Lovenox Code Status: Full Family Communication: None at bedside Disposition Plan: Patient was initially admitted as an observation status. However she continues to have significant bronchospasm suggesting ongoing COPD exacerbation and will require continued inpatient care including IV steroids, bronchodilator nebulizations and monitoring for possible decline. Hence will require to [facility] stay since arrival to the ED on 1/12. Thereby changed admission status to inpatient on 1/13. DC home when medically stable, possibly in 2-3 days.   Consultants:   None  Procedures:   None  Antimicrobials:   Rocephin IV 1  Azithromycin 1   Subjective: Feels slightly better. Dyspnea has improved somewhat. Still has dry hacking cough and chest pain associated with it.  Objective:  Vitals:   02/29/16 0539 02/29/16 0748 02/29/16 0840 02/29/16 1233  BP:   127/61   Pulse: 97  96 98  Resp: 18  20 20   Temp: 98.5 F (36.9 C)  97.8 F (36.6 C) 98.1 F (36.7 C)  TempSrc: Oral  Oral Oral  SpO2: 98% 98% 97% 96%  Weight: 67.4 kg (148 lb 8 oz)     Height:        Intake/Output Summary (Last 24 hours) at 02/29/16 1349 Last data filed at 02/29/16 1100  Gross per 24 hour  Intake          1473.83 ml  Output             1850 ml  Net          -376.17 ml   [employer]  02/28/16 2125 02/29/16 0539  Weight: 67.5 kg (148 lb 14.4 oz) 67.4 kg (148 lb 8 oz)    Examination:  General exam: Pleasant middle-aged female sitting up comfortably in bed without distress. Respiratory system: Improved breath sounds compared to initial admission but still harsh with scattered  few bilateral medium pitched expiratory rhonchi. No increased work of breathing and able to speak in full sentences. Having intermittent dry hacking cough. Cardiovascular system: S1 & S2 heard, RRR. No JVD, murmurs, rubs, gallops or clicks. No pedal edema. Telemetry: Sinus rhythm-telemetry discontinued 1/13  Gastrointestinal system: Abdomen is nondistended, soft and nontender. No organomegaly or masses felt. Normal bowel sounds heard. Central nervous system: Alert and oriented. No focal neurological deficits. Extremities: Symmetric 5 x 5 power. Skin: No rashes, lesions or ulcers Psychiatry: Judgement and insight appear normal. Mood & affect appropriate.     Data Reviewed: I have personally reviewed following labs and imaging studies  CBC:  Recent Labs Lab 02/24/16 0954 02/28/16 1045  WBC 5.3 8.2  NEUTROABS 2.4 3.5  HGB 11.8* 12.2  HCT 35.2* 36.2  MCV 90.7 88.7  PLT 250 [id number]   Basic Metabolic Panel:  Recent Labs Lab 02/24/16 0954 02/28/16 1045  NA 142 138  K 3.1* 3.7  CL 108 105  CO2 26 23  GLUCOSE 129* 126*  BUN 9 13  CREATININE 0.88 0.89  CALCIUM 9.6 9.7   GFR: Estimated Creatinine Clearance: 65.8 mL/min (by C-G formula based on SCr of 0.89 mg/dL). Liver Function Tests:  Recent Labs Lab 02/24/16 0954  AST 29  ALT 22  ALKPHOS 128*  BILITOT 0.3  PROT 7.4  ALBUMIN 4.3   No results for input(s): LIPASE, AMYLASE in the last 168 hours. No results for input(s): AMMONIA in the last 168 hours. Coagulation Profile: No results for input(s): INR, PROTIME in the last 168 hours. Cardiac Enzymes: No results for input(s): CKTOTAL, CKMB, CKMBINDEX, TROPONINI in the last 168 hours. BNP (last 3 results) No results for input(s): PROBNP in the last 8760 hours. HbA1C: No results for input(s): HGBA1C in the last 72 hours. CBG:  Recent Labs Lab 02/28/16 2118 02/29/16 0535 02/29/16 1124  GLUCAP 226* 144* 193*   Lipid Profile: No results for input(s): CHOL, HDL,  LDLCALC, TRIG, CHOLHDL, LDLDIRECT in the last 72 hours. Thyroid Function Tests: No results for input(s): TSH, T4TOTAL, FREET4, T3FREE, THYROIDAB in the last 72 hours. Anemia Panel: No results for input(s): VITAMINB12, FOLATE, FERRITIN, TIBC, IRON, RETICCTPCT in the last 72 hours.  Sepsis Labs:  Recent Labs Lab 02/24/16 0954 02/28/16 1045  PROCALCITON  --  <0.10  WBC 5.3 8.2    No results found for this or any previous visit (from the past 240 hour(s)).       Radiology Studies: Dg Chest 2 View  Result Date: 02/28/2016 CLINICAL DATA:  Shortness of breath and cough EXAM: CHEST  2 VIEW COMPARISON:  February 24, 2016 FINDINGS: There is mild atelectatic change in the right base. Lungs elsewhere are clear. Heart size and pulmonary vascularity are normal. There is atherosclerotic calcification in the aorta. No adenopathy. No bone lesions. IMPRESSION: Right base atelectasis. No edema or consolidation. Aortic atherosclerosis. Electronically Signed   By: Lowella Grip III M.D.   On: 02/28/2016 11:15        Scheduled Meds: . amLODipine  10 mg Oral Daily  . atorvastatin  20 mg Oral Daily  . benzonatate  200 mg Oral TID  . buPROPion  75 mg Oral BID  .  enoxaparin (LOVENOX) injection  40 mg Subcutaneous Q24H  . insulin aspart  0-5 Units Subcutaneous QHS  . insulin aspart  0-9 Units Subcutaneous TID WC  . ipratropium-albuterol  3 mL Nebulization TID  . losartan  25 mg Oral Daily  . methocarbamol  500 mg Oral TID  . methylPREDNISolone (SOLU-MEDROL) injection  60 mg Intravenous Q8H  . nicotine  21 mg Transdermal Daily  . pantoprazole  40 mg Oral Daily  . pregabalin  150 mg Oral BID  . senna-docusate  1 tablet Oral Daily  . sertraline  100 mg Oral Daily  . sodium chloride flush  3 mL Intravenous Q12H   Continuous Infusions: . sodium chloride 50 mL/hr at 02/28/16 2211     LOS: 0 days      [facility], MD [facility] Pager [phone]  If 7PM-7AM, please contact  night-coverage [email or web address] Password [email or web address] 02/29/2016, 1:49 PM

## 2016-02-29 NOTE — Progress Notes (Signed)
Patient wanted to take nasal cannula off, oxygen saturation 100% on room air.

## 2016-03-01 LAB — GLUCOSE, CAPILLARY
Glucose-Capillary: 153 mg/dL — ABNORMAL HIGH (ref 65–99)
Glucose-Capillary: 150 mg/dL — ABNORMAL HIGH (ref 65–99)
Glucose-Capillary: 121 mg/dL — ABNORMAL HIGH (ref 65–99)
Glucose-Capillary: 118 mg/dL — ABNORMAL HIGH (ref 65–99)

## 2016-03-01 LAB — PROCALCITONIN

## 2016-03-01 MED ORDER — METHYLPREDNISOLONE SODIUM SUCC 40 MG IJ SOLR
40.0000 mg | Freq: Two times a day (BID) | INTRAMUSCULAR | Status: DC
  Administered 2016-03-01 – 2016-03-02 (×2): 40 mg via INTRAVENOUS
  Filled 2016-03-01 (×2): qty 1

## 2016-03-01 NOTE — Progress Notes (Signed)
Order written 02/29/16 @ 1046 to discontinue cardiac monitoring.

## 2016-03-01 NOTE — Progress Notes (Addendum)
PROGRESS NOTE  [name]  [id number] DOB: May 22, 1960  DOA: 02/28/2016 PCP: Pcp Not In System   Brief Narrative:  56 y.o. female, separated, recently moved from [place] and lives with her son and grandchildren, states that she ambulates with the help of walker due to gait instability, PMH of tobacco abuse, GERD, HTN, PTSD/? Anxiety & depression, 2 falls in 2017 (January and August) with? Syncope (has appointment to see outpatient cardiology), presented to the ED with complaints of worsening dyspnea and nonproductive cough. Admitted for acute bronchitis with COPD exacerbation, acute respiratory failure with hypoxia and low index of suspicion for pneumonia.   Assessment & Plan:   Principal Problem:   COPD exacerbation ([place]) Active Problems:   Tobacco abuse   GERD (gastroesophageal reflux disease)   Essential hypertension   Acute bronchitis   1. Acute bronchitis with COPD exacerbation: Likely viral etiology. In the absence of fevers, leukocytosis, productive cough and chest x-ray suggesting right basilar atelectasis, pneumonia felt less likely. Pro calcitonin < 0.10. Received IV Rocephin and azithromycin in ED. Discontinued all antibiotics and monitor off of them. Continue oxygen, bronchodilator nebulizations, IV Solu-Medrol, incentive spirometry and flutter valve. Tobacco cessation counseled. Changed Tessalon to scheduled for cough suppression. Flutter valve and incentive spirometry. Recommend repeating chest x-ray in 3-4 weeks to ensure resolution of noted atelectasis. Continues to improve. Taper down IV Solu-Medrol and possible transition to oral prednisone taper at discharge tomorrow. 2. Acute respiratory failure with hypoxia: Secondary to problem #1. Treatment as above and monitor. Hypoxia seems to have resolved. As per PT, oxygen saturations remained >93% on ambulation on room air. 3. Essential hypertension: Continue amlodipine and Cozaar. Controlled. 4. Hyperlipidemia:  Continue atorvastatin 5. PTSD/? Anxiety & depression: Continue home medications. 6. Type II DM: Hold oral hypoglycemics. SSI. Mildly uncontrolled. Check A1c as outpatient. 7. GERD: Continue PPI. 8. Tobacco abuse: Cessation counseled. Nicotine patch. 9. Reported history of falls (last in August 2017), gait instability: PT evaluation appreciated and recommend no PT follow-up but rolling walker-will order at discharge.   DVT prophylaxis: Lovenox Code Status: Full Family Communication: None at bedside Disposition Plan: Patient was initially admitted as an observation status. However she continues to have significant bronchospasm suggesting ongoing COPD exacerbation and will require continued inpatient care including IV steroids, bronchodilator nebulizations and monitoring for possible decline. Hence will require [facility] stay since arrival to the ED on 1/12. Thereby changed admission status to inpatient on 1/13. DC home when medically stable, possibly 1/15   Consultants:   None  Procedures:   None  Antimicrobials:   Rocephin IV 1  Azithromycin 1   Subjective: Soreness of chest from coughing. Dyspnea improving. Denies any other complaints. As per RN, no acute issues. Cough improved.  Objective:  Vitals:   02/29/16 1945 03/01/16 0555 03/01/16 0852 03/01/16 1231  BP: 137/80 (!) 115/57  139/77  Pulse: 84 67  74  Resp: 20 20  20   Temp: 98.4 F (36.9 C) 98.1 F (36.7 C)  97.5 F (36.4 C)  TempSrc: Oral Oral  Oral  SpO2: 100% 96% 100% 97%  Weight:  66.4 kg (146 lb 4.8 oz)    Height:        Intake/Output Summary (Last 24 hours) at 03/01/16 1304 Last data filed at 03/01/16 1054  Gross per 24 hour  Intake             1062 ml  Output             1800  ml  Net             -738 ml   Filed Weights   02/28/16 2125 02/29/16 0539 03/01/16 0555  Weight: 67.5 kg (148 lb 14.4 oz) 67.4 kg (148 lb 8 oz) 66.4 kg (146 lb 4.8 oz)    Examination:  General exam: Pleasant  middle-aged female sitting up comfortably in bed without distress. Respiratory system: Good breath sounds bilaterally. Occasional rhonchi posteriorly but significantly improved compared to admission. No increased work of breathing and able to speak in full sentences.  Cardiovascular system: S1 & S2 heard, RRR. No JVD, murmurs, rubs, gallops or clicks. No pedal edema.  Gastrointestinal system: Abdomen is nondistended, soft and nontender. No organomegaly or masses felt. Normal bowel sounds heard. Central nervous system: Alert and oriented. No focal neurological deficits. Extremities: Symmetric 5 x 5 power. Skin: No rashes, lesions or ulcers Psychiatry: Judgement and insight appear normal. Mood & affect appropriate.     Data Reviewed: I have personally reviewed following labs and imaging studies  CBC:  Recent Labs Lab 02/24/16 0954 02/28/16 1045  WBC 5.3 8.2  NEUTROABS 2.4 3.5  HGB 11.8* 12.2  HCT 35.2* 36.2  MCV 90.7 88.7  PLT 250 [id number]   Basic Metabolic Panel:  Recent Labs Lab 02/24/16 0954 02/28/16 1045  NA 142 138  K 3.1* 3.7  CL 108 105  CO2 26 23  GLUCOSE 129* 126*  BUN 9 13  CREATININE 0.88 0.89  CALCIUM 9.6 9.7   GFR: Estimated Creatinine Clearance: 65.8 mL/min (by C-G formula based on SCr of 0.89 mg/dL). Liver Function Tests:  Recent Labs Lab 02/24/16 0954  AST 29  ALT 22  ALKPHOS 128*  BILITOT 0.3  PROT 7.4  ALBUMIN 4.3   No results for input(s): LIPASE, AMYLASE in the last 168 hours. No results for input(s): AMMONIA in the last 168 hours. Coagulation Profile: No results for input(s): INR, PROTIME in the last 168 hours. Cardiac Enzymes: No results for input(s): CKTOTAL, CKMB, CKMBINDEX, TROPONINI in the last 168 hours. BNP (last 3 results) No results for input(s): PROBNP in the last 8760 hours. HbA1C: No results for input(s): HGBA1C in the last 72 hours. CBG:  Recent Labs Lab 02/29/16 1124 02/29/16 1647 02/29/16 2156 03/01/16 0602  03/01/16 1153  GLUCAP 193* 194* 181* 153* 150*   Lipid Profile: No results for input(s): CHOL, HDL, LDLCALC, TRIG, CHOLHDL, LDLDIRECT in the last 72 hours. Thyroid Function Tests: No results for input(s): TSH, T4TOTAL, FREET4, T3FREE, THYROIDAB in the last 72 hours. Anemia Panel: No results for input(s): VITAMINB12, FOLATE, FERRITIN, TIBC, IRON, RETICCTPCT in the last 72 hours.  Sepsis Labs:  Recent Labs Lab 02/24/16 0954 02/28/16 1045 03/01/16 0509  PROCALCITON  --  <0.10 <0.10  WBC 5.3 8.2  --     No results found for this or any previous visit (from the past 240 hour(s)).       Radiology Studies: No results found.      Scheduled Meds: . amLODipine  10 mg Oral Daily  . atorvastatin  20 mg Oral Daily  . benzonatate  200 mg Oral TID  . buPROPion  75 mg Oral BID  . enoxaparin (LOVENOX) injection  40 mg Subcutaneous Q24H  . insulin aspart  0-5 Units Subcutaneous QHS  . insulin aspart  0-9 Units Subcutaneous TID WC  . ipratropium-albuterol  3 mL Nebulization TID  . losartan  25 mg Oral Daily  . methocarbamol  500 mg Oral TID  .  methylPREDNISolone (SOLU-MEDROL) injection  40 mg Intravenous Q12H  . nicotine  21 mg Transdermal Daily  . pantoprazole  40 mg Oral Daily  . pregabalin  150 mg Oral BID  . senna-docusate  1 tablet Oral Daily  . sertraline  100 mg Oral Daily  . sodium chloride flush  3 mL Intravenous Q12H   Continuous Infusions:    LOS: 1 day      Heiley Shaikh, MD [facility] Pager [phone]  If 7PM-7AM, please contact night-coverage [email or web address] Password [email or web address] 03/01/2016, 1:04 PM

## 2016-03-02 LAB — GLUCOSE, CAPILLARY
GLUCOSE-CAPILLARY: 185 mg/dL — AB (ref 65–99)
GLUCOSE-CAPILLARY: 203 mg/dL — AB (ref 65–99)

## 2016-03-02 MED ORDER — ALBUTEROL SULFATE HFA 108 (90 BASE) MCG/ACT IN AERS: 2.0000 | INHALATION_SPRAY | Freq: Four times a day (QID) | RESPIRATORY_TRACT | 0 refills | Status: DC | PRN

## 2016-03-02 MED ORDER — ACETAMINOPHEN 325 MG PO TABS: 650.0000 mg | ORAL_TABLET | Freq: Four times a day (QID) | ORAL | Status: DC | PRN

## 2016-03-02 MED ORDER — PREDNISONE 10 MG PO TABS: ORAL_TABLET | ORAL | 0 refills | Status: DC

## 2016-03-02 MED ORDER — NICOTINE 21 MG/24HR TD PT24: 21.0000 mg | MEDICATED_PATCH | Freq: Every day | TRANSDERMAL | 0 refills | Status: DC

## 2016-03-02 MED ORDER — HYDROCOD POLST-CPM POLST ER 10-8 MG/5ML PO SUER: 5.0000 mL | Freq: Two times a day (BID) | ORAL | 0 refills | Status: DC | PRN

## 2016-03-02 MED ORDER — IPRATROPIUM-ALBUTEROL 20-100 MCG/ACT IN AERS: 1.0000 | INHALATION_SPRAY | Freq: Three times a day (TID) | RESPIRATORY_TRACT | 0 refills | Status: DC

## 2016-03-02 MED ORDER — BENZONATATE 200 MG PO CAPS: 200.0000 mg | ORAL_CAPSULE | Freq: Three times a day (TID) | ORAL | 0 refills | Status: DC

## 2016-03-02 NOTE — Progress Notes (Signed)
Patient rested well overnight. Vitals remained stable. Patient in bed resting.

## 2016-03-02 NOTE — Discharge Instructions (Signed)
Acute Bronchitis, Adult Acute bronchitis is sudden (acute) swelling of the air tubes (bronchi) in the lungs. Acute bronchitis causes these tubes to fill with mucus, which can make it hard to breathe. It can also cause coughing or wheezing. In adults, acute bronchitis usually goes away within 2 weeks. A cough caused by bronchitis may last up to 3 weeks. Smoking, allergies, and asthma can make the condition worse. Repeated episodes of bronchitis may cause further lung problems, such as chronic obstructive pulmonary disease (COPD). What are the causes? This condition can be caused by germs and by substances that irritate the lungs, including:  Cold and flu viruses. This condition is most often caused by the same virus that causes a cold.  Bacteria.  Exposure to tobacco smoke, dust, fumes, and air pollution. What increases the risk? This condition is more likely to develop in people who:  Have close contact with someone with acute bronchitis.  Are exposed to lung irritants, such as tobacco smoke, dust, fumes, and vapors.  Have a weak immune system.  Have a respiratory condition such as asthma. What are the signs or symptoms? Symptoms of this condition include:  A cough.  Coughing up clear, yellow, or green mucus.  Wheezing.  Chest congestion.  Shortness of breath.  A fever.  Body aches.  Chills.  A sore throat. How is this diagnosed? This condition is usually diagnosed with a physical exam. During the exam, your health care provider may order tests, such as chest X-rays, to rule out other conditions. He or she may also:  Test a sample of your mucus for bacterial infection.  Check the level of oxygen in your blood. This is done to check for pneumonia.  Do a chest X-ray or lung function testing to rule out pneumonia and other conditions.  Perform blood tests. Your health care provider will also ask about your symptoms and medical history. How is this treated? Most cases  of acute bronchitis clear up over time without treatment. Your health care provider may recommend:  Drinking more fluids. Drinking more makes your mucus thinner, which may make it easier to breathe.  Taking a medicine for a fever or cough.  Taking an antibiotic medicine.  Using an inhaler to help improve shortness of breath and to control a cough.  Using a cool mist vaporizer or humidifier to make it easier to breathe. Follow these instructions at home: Medicines  Take over-the-counter and prescription medicines only as told by your health care provider.  If you were prescribed an antibiotic, take it as told by your health care provider. Do not stop taking the antibiotic even if you start to feel better. General instructions  Get plenty of rest.  Drink enough fluids to keep your urine clear or pale yellow.  Avoid smoking and secondhand smoke. Exposure to cigarette smoke or irritating chemicals will make bronchitis worse. If you smoke and you need help quitting, ask your health care provider. Quitting smoking will help your lungs heal faster.  Use an inhaler, cool mist vaporizer, or humidifier as told by your health care provider.  Keep all follow-up visits as told by your health care provider. This is important. How is this prevented? To lower your risk of getting this condition again:  Wash your hands often with soap and water. If soap and water are not available, use hand sanitizer.  Avoid contact with people who have cold symptoms.  Try not to touch your hands to your mouth, nose, or eyes.  Make sure to get the flu shot every year. Contact a health care provider if:  Your symptoms do not improve in 2 weeks of treatment. Get help right away if:  You cough up blood.  You have chest pain.  You have severe shortness of breath.  You become dehydrated.  You faint or keep feeling like you are going to faint.  You keep vomiting.  You have a severe headache.  Your  fever or chills gets worse. This information is not intended to replace advice given to you by your health care provider. Make sure you discuss any questions you have with your health care provider. Document Released: 03/12/2004 Document Revised: 08/28/2015 Document Reviewed: 07/24/2015 Elsevier Interactive Patient Education  2017 [employer]   Chronic Obstructive Pulmonary Disease Chronic obstructive pulmonary disease (COPD) is a common lung condition in which airflow from the lungs is limited. COPD is a general term that can be used to describe many different lung problems that limit airflow, including both chronic bronchitis and emphysema. If you have COPD, your lung function will probably never return to normal, but there are measures you can take to improve lung function and make yourself feel better. What are the causes?  Smoking (common).  Exposure to secondhand smoke.  Genetic problems.  Chronic inflammatory lung diseases or recurrent infections. What are the signs or symptoms?  Shortness of breath, especially with physical activity.  Deep, persistent (chronic) cough with a large amount of thick mucus.  Wheezing.  Rapid breaths (tachypnea).  Gray or bluish discoloration (cyanosis) of the skin, especially in your fingers, toes, or lips.  Fatigue.  Weight loss.  Frequent infections or episodes when breathing symptoms become much worse (exacerbations).  Chest tightness. How is this diagnosed? Your health care provider will take a medical history and perform a physical examination to diagnose COPD. Additional tests for COPD may include:  Lung (pulmonary) function tests.  Chest X-ray.  CT scan.  Blood tests. How is this treated? Treatment for COPD may include:  Inhaler and nebulizer medicines. These help manage the symptoms of COPD and make your breathing more comfortable.  Supplemental oxygen. Supplemental oxygen is only helpful if you have a low oxygen level  in your blood.  Exercise and physical activity. These are beneficial for nearly all people with COPD.  Lung surgery or transplant.  Nutrition therapy to gain weight, if you are underweight.  Pulmonary rehabilitation. This may involve working with a team of health care providers and specialists, such as respiratory, occupational, and physical therapists. Follow these instructions at home:  Take all medicines (inhaled or pills) as directed by your health care provider.  Avoid over-the-counter medicines or cough syrups that dry up your airway (such as antihistamines) and slow down the elimination of secretions unless instructed otherwise by your health care provider.  If you are a smoker, the most important thing that you can do is stop smoking. Continuing to smoke will cause further lung damage and breathing trouble. Ask your health care provider for help with quitting smoking. He or she can direct you to community resources or hospitals that provide support.  Avoid exposure to irritants such as smoke, chemicals, and fumes that aggravate your breathing.  Use oxygen therapy and pulmonary rehabilitation if directed by your health care provider. If you require home oxygen therapy, ask your health care provider whether you should purchase a pulse oximeter to measure your oxygen level at home.  Avoid contact with individuals who have a contagious illness.  Avoid extreme temperature and humidity changes.  Eat healthy foods. Eating smaller, more frequent meals and resting before meals may help you maintain your strength.  Stay active, but balance activity with periods of rest. Exercise and physical activity will help you maintain your ability to do things you want to do.  Preventing infection and hospitalization is very important when you have COPD. Make sure to receive all the vaccines your health care provider recommends, especially the pneumococcal and influenza vaccines. Ask your health care  provider whether you need a pneumonia vaccine.  Learn and use relaxation techniques to manage stress.  Learn and use controlled breathing techniques as directed by your health care provider. Controlled breathing techniques include: 1. Pursed lip breathing. Start by breathing in (inhaling) through your nose for 1 second. Then, purse your lips as if you were going to whistle and breathe out (exhale) through the pursed lips for 2 seconds. 2. Diaphragmatic breathing. Start by putting one hand on your abdomen just above your waist. Inhale slowly through your nose. The hand on your abdomen should move out. Then purse your lips and exhale slowly. You should be able to feel the hand on your abdomen moving in as you exhale.  Learn and use controlled coughing to clear mucus from your lungs. Controlled coughing is a series of short, progressive coughs. The steps of controlled coughing are: 1. Lean your head slightly forward. 2. Breathe in deeply using diaphragmatic breathing. 3. Try to hold your breath for 3 seconds. 4. Keep your mouth slightly open while coughing twice. 5. Spit any mucus out into a tissue. 6. Rest and repeat the steps once or twice as needed. Contact a health care provider if:  You are coughing up more mucus than usual.  There is a change in the color or thickness of your mucus.  Your breathing is more labored than usual.  Your breathing is faster than usual. Get help right away if:  You have shortness of breath while you are resting.  You have shortness of breath that prevents you from:  Being able to talk.  Performing your usual physical activities.  You have chest pain lasting longer than 5 minutes.  Your skin color is more cyanotic than usual.  You measure low oxygen saturations for longer than 5 minutes with a pulse oximeter. This information is not intended to replace advice given to you by your health care provider. Make sure you discuss any questions you have  with your health care provider. Document Released: 11/12/2004 Document Revised: 07/11/2015 Document Reviewed: 09/29/2012 Elsevier Interactive Patient Education  2017 [employer].  Additional discharge instructions  Please get your medications reviewed and adjusted by your Primary MD.  Please request your Primary MD to go over all Hospital Tests and Procedure/Radiological results at the follow up, please get all Hospital records sent to your Prim MD by signing hospital release before you go home.  If you had Pneumonia of Lung problems at the Hospital: Please get a 2 view Chest X ray done in 6-8 weeks after hospital discharge or sooner if instructed by your Primary MD.  If you have Congestive Heart Failure: Please call your Cardiologist or Primary MD anytime you have any of the following symptoms:  1) 3 pound weight gain in 24 hours or 5 pounds in 1 week  2) shortness of breath, with or without a dry hacking cough  3) swelling in the hands, feet or stomach  4) if you have to sleep on extra  pillows at night in order to breathe  Follow cardiac low salt diet and 1.5 lit/day fluid restriction.  If you have diabetes Accuchecks 4 times/day, Once in AM empty stomach and then before each meal. Log in all results and show them to your primary doctor at your next visit. If any glucose reading is under 80 or above 300 call your primary MD immediately.  If you have Seizure/Convulsions/Epilepsy: Please do not drive, operate heavy machinery, participate in activities at heights or participate in high speed sports until you have seen by Primary MD or a Neurologist and advised to do so again.  If you had Gastrointestinal Bleeding: Please ask your Primary MD to check a complete blood count within one week of discharge or at your next visit. Your endoscopic/colonoscopic biopsies that are pending at the time of discharge, will also need to followed by your Primary MD.  Get Medicines reviewed and  adjusted. Please take all your medications with you for your next visit with your Primary MD  Please request your Primary MD to go over all hospital tests and procedure/radiological results at the follow up, please ask your Primary MD to get all Hospital records sent to his/her office.  If you experience worsening of your admission symptoms, develop shortness of breath, life threatening emergency, suicidal or homicidal thoughts you must seek medical attention immediately by calling 911 or calling your MD immediately  if symptoms less severe.  You must read complete instructions/literature along with all the possible adverse reactions/side effects for all the Medicines you take and that have been prescribed to you. Take any new Medicines after you have completely understood and accpet all the possible adverse reactions/side effects.   Do not drive or operate heavy machinery when taking Pain medications.   Do not take more than prescribed Pain, Sleep and Anxiety Medications  Special Instructions: If you have smoked or chewed Tobacco  in the last 2 yrs please stop smoking, stop any regular Alcohol  and or any Recreational drug use.  Wear Seat belts while driving.  Please note You were cared for by a hospitalist during your hospital stay. If you have any questions about your discharge medications or the care you received while you were in the hospital after you are discharged, you can call the unit and asked to speak with the hospitalist on call if the hospitalist that took care of you is not available. Once you are discharged, your primary care physician will handle any further medical issues. Please note that NO REFILLS for any discharge medications will be authorized once you are discharged, as it is imperative that you return to your primary care physician (or establish a relationship with a primary care physician if you do not have one) for your aftercare needs so that they can reassess your need  for medications and monitor your lab values.  You can reach the hospitalist office at phone [phone] or fax [phone]   If you do not have a primary care physician, you can call [phone] for a physician referral.

## 2016-03-02 NOTE — Progress Notes (Signed)
SATURATION QUALIFICATIONS: (This note is used to comply with regulatory documentation for home oxygen)  Patient Saturations on Room Air at Rest = 100%  Patient Saturations on Room Air while Ambulating = 100%  Patient Saturations on 0 Liters of oxygen while Ambulating = 100%  Please briefly explain why patient needs home oxygen:  Pt does NOT qualify for home O2, as her O2 saturations remained at 100%.

## 2016-03-02 NOTE — Progress Notes (Signed)
Orders received for pt discharge.  Discharge summary printed and reviewed with pt.  Explained medication regimen, and pt had no further questions at this. Pt in stable condition and awaiting transport.  Pt had earlier decided to travel via bus to home, yet now, her family member will be picking her up.  Bus voucher has been returned to [place].

## 2016-03-02 NOTE — Progress Notes (Signed)
Orders received for pt discharge.  IV removed without complication.  Pt was already non-telemetry.  Before pt is able to leave, we are waiting for her home walker to be delivered.  Pt, then, informs me that she will be taking the bus to home.

## 2016-03-02 NOTE — Progress Notes (Signed)
Physical Therapy Treatment Patient Details Name: [name] MRN: [id number] DOB: 1960-03-07 Today's Date: 03/02/2016    History of Present Illness Pt is a 56 y/o female admitted secondary to acute brochitis. PMH including but not limited to tobacco abuse, HTN and PTSD.    PT Comments    Pt is up with PT to walk with no O2 as per nsg having previously checked on her.  Her sats are high, no real LOB but did have her sit for a short time on the walk due to being light headed.  Will discharge home shortly and will anticipate her to follow up with MD for medical monitoring of her function with no O2 going home with her.  Follow Up Recommendations  No PT follow up;Supervision for mobility/OOB     Equipment Recommendations  Rolling walker with 5" wheels    Recommendations for Other Services       Precautions / Restrictions Precautions Precautions: None Restrictions Weight Bearing Restrictions: No    Mobility  Bed Mobility Overal bed mobility: Modified Independent Bed Mobility: Supine to Sit;Sit to Supine     Supine to sit: Modified independent (Device/Increase time) Sit to supine: Modified independent (Device/Increase time)      Transfers Overall transfer level: Needs assistance Equipment used: Rolling walker (2 wheeled) Transfers: Sit to/from [facility] Sit to Stand: Min guard Stand pivot transfers: Min guard       General transfer comment: cues for hand placement  Ambulation/Gait Ambulation/Gait assistance: Min guard Ambulation Distance (Feet): 100 Feet Assistive device: Rolling walker (2 wheeled) Gait Pattern/deviations: Step-through pattern;Decreased stride length;Narrow base of support;Trunk flexed Gait velocity: decreased Gait velocity interpretation: Below normal speed for age/gender General Gait Details: reports light headedness, sat down in chair to rest then continued but O2 sats were 98% throughout   Stairs             Wheelchair Mobility    Modified Rankin (Stroke Patients Only)       Balance Overall balance assessment: Needs assistance Sitting-balance support: Feet supported Sitting balance-Leahy Scale: Good     Standing balance support: Bilateral upper extremity supported Standing balance-Leahy Scale: Fair                      Cognition Arousal/Alertness: Awake/alert Behavior During Therapy: WFL for tasks assessed/performed Overall Cognitive Status: Within Functional Limits for tasks assessed                      Exercises      General Comments        Pertinent Vitals/Pain Pain Assessment: No/denies pain    Home Living                      Prior Function            PT Goals (current goals can now be found in the care plan section) Acute Rehab PT Goals Patient Stated Goal: get home PT Goal Formulation: With patient Progress towards PT goals: Progressing toward goals (asked when her walker would be delivered)    Frequency    Min 3X/week      PT Plan Current plan remains appropriate    Co-evaluation             End of Session Equipment Utilized During Treatment: Gait belt Activity Tolerance: Patient limited by fatigue Patient left: in bed;with call bell/phone within reach (sat side of bed)     Time:  [id number] PT Time Calculation (min) (ACUTE ONLY): 18 min  Charges:  $Gait Training: 8-22 mins                    G Codes:      Ramond Dial Mar 27, 2016, 12:07 PM   Mee Hives, PT MS Acute Rehab Dept. Number: [place] [facility] [place]

## 2016-03-02 NOTE — Progress Notes (Addendum)
PT was ambulated to assess if she qualifies for home O2.  Although her sats remained at 100%, pt was wheezing and had labored breathing.  MD has been notified.

## 2016-03-02 NOTE — Discharge Summary (Signed)
Physician Discharge Summary  [name] [id number] DOB: 21-Dec-1960  PCP: Silvio Pate, PA-C  Admit date: 02/28/2016 Discharge date: 03/02/2016  Recommendations for Outpatient Follow-up:  1. Silvio Pate, PA-C/PCP on 03/09/16 at 10:30 AM.  2. Recommend checking hemoglobin A1c if one has not recently been done as outpatient. 3. Recommend repeating chest x-ray in 3-4 weeks to ensure resolution of right basal atelectasis seen on x-ray. 4. Recommend outpatient pulmonology consultation for further evaluation of possible COPD and management.   Home Health: None Equipment/Devices: Rolling walker with 5 inch wheels.    Discharge Condition: Improved and stable  CODE STATUS: Full  Diet recommendation: Heart healthy and diabetic diet.  Discharge Diagnoses:  Principal Problem:   COPD exacerbation ([place]) Active Problems:   Tobacco abuse   GERD (gastroesophageal reflux disease)   Essential hypertension   Acute bronchitis   Brief/Interim Summary: 56 y.o.female, separated, recently moved from [place] and lives with her son and grandchildren, states that she ambulates with the help of walker due to gait instability, PMH of tobacco abuse, GERD, HTN, PTSD/? Anxiety & depression, 2 falls in 2017 (January and August) with? Syncope (has appointment to see outpatient cardiology), presented to the ED with complaints of worsening dyspnea and nonproductive cough. Admitted for acute bronchitis with COPD exacerbation, acute respiratory failure with hypoxia and low index of suspicion for pneumonia.   Assessment & Plan:   1. Acute bronchitis with COPD exacerbation: Likely viral etiology. In the absence of fevers, leukocytosis, productive cough and chest x-ray suggesting right basilar atelectasis, pneumonia felt less likely. Pro calcitonin < 0.10. Received IV Rocephin and azithromycin in ED. Discontinued all antibiotics and monitored off of them. Treated with oxygen, bronchodilator  nebulizations, IV Solu-Medrol, incentive spirometry and flutter valve. Tobacco cessation counseled. Changed Tessalon to scheduled for cough suppression. Flutter valve and incentive spirometry. Recommend repeating chest x-ray in 3-4 weeks to ensure resolution of noted atelectasis. Patient improved. IV Solu-Medrol was tapered. Patient was ambulated and was not hypoxic on room air. There was some concern regarding dyspnea and wheezing on exertion. Reassessed while patient was being ambulated and she seems to have some exertional cough and anxiety but not really dyspneic and continue to maintain saturations in the mid 90s on room air. At discharge, taper prednisone over 10-12 days, scheduled Tessalon and when necessary Tussionex for bothersome dry cough which is gradually improving, scheduled Combivent and when necessary albuterol inhaler. Recommend outpatient pulmonology consultation for formal evaluation of COPD and management as deemed necessary. All of this was discussed in detail with patient and she verbalized understanding. She has been advised that she should not drive while she is taking Tussionex and or other home controlled medications. 2. Acute respiratory failure with hypoxia: Secondary to problem #1. Management as above. Hypoxia resolved. 3. Essential hypertension: Continue amlodipine and Cozaar. Controlled. 4. Hyperlipidemia: Continue atorvastatin 5. PTSD/? Anxiety & depression: Continue home medications. 6. Type II DM: Check A1c as outpatient. Oral medications were held in the hospital and she was on SSI with mildly uncontrolled and fluctuating CBGs. Resume metformin at discharge. 7. GERD: Continue PPI. 8. Tobacco abuse: Cessation counseled multiple times. Nicotine patch-continue at discharge as per patient request.. 9. Reported history of falls (last in August 2017), gait instability: PT evaluation appreciated and recommend no PT follow-up but rolling walker-arranged through case  management..    Consultants:   None  Procedures:   None   Discharge Instructions  Discharge Instructions    Call MD for:  difficulty breathing, headache or  visual disturbances    Complete by:  As directed    Call MD for:  extreme fatigue    Complete by:  As directed    Call MD for:  persistant dizziness or light-headedness    Complete by:  As directed    Call MD for:  severe uncontrolled pain    Complete by:  As directed    Call MD for:  temperature >100.4    Complete by:  As directed    Diet - low sodium heart healthy    Complete by:  As directed    Diet Carb Modified    Complete by:  As directed    Discharge instructions    Complete by:  As directed    Do not drive while you are taking the cough medicine Tussionex.   Increase activity slowly    Complete by:  As directed        Medication List    STOP taking these medications   HYDROcodone-homatropine 5-1.5 MG/5ML syrup Commonly known as:  HYCODAN   levofloxacin 750 MG tablet Commonly known as:  LEVAQUIN     TAKE these medications   acetaminophen 325 MG tablet Commonly known as:  TYLENOL Take 2 tablets (650 mg total) by mouth every 6 (six) hours as needed for mild pain, moderate pain, fever or headache (or Fever >/= 101).   albuterol 108 (90 Base) MCG/ACT inhaler Commonly known as:  PROVENTIL HFA;VENTOLIN HFA Inhale 2 puffs into the lungs every 6 (six) hours as needed for wheezing or shortness of breath.   amLODipine 10 MG tablet Commonly known as:  NORVASC Take 10 mg by mouth daily.   atorvastatin 20 MG tablet Commonly known as:  LIPITOR Take 20 mg by mouth daily.   benzonatate 200 MG capsule Commonly known as:  TESSALON Take 1 capsule (200 mg total) by mouth 3 (three) times daily.   buPROPion 75 MG tablet Commonly known as:  WELLBUTRIN Take 75 mg by mouth 2 (two) times daily.   chlorpheniramine-HYDROcodone 10-8 MG/5ML Suer Commonly known as:  TUSSIONEX Take 5 mLs by mouth every 12  (twelve) hours as needed for cough.   clotrimazole-betamethasone cream Commonly known as:  LOTRISONE Apply 1 application topically at bedtime.   Ipratropium-Albuterol 20-100 MCG/ACT Aers respimat Commonly known as:  COMBIVENT RESPIMAT Inhale 1 puff into the lungs 3 (three) times daily.   lidocaine 5 % Commonly known as:  LIDODERM Place 1 patch onto the skin daily. Remove & Discard patch within 12 hours or as directed by MD   LORazepam 0.5 MG tablet Commonly known as:  ATIVAN Take 0.5 mg by mouth every 8 (eight) hours as needed for anxiety.   losartan 25 MG tablet Commonly known as:  COZAAR Take 25 mg by mouth daily.   metFORMIN 500 MG tablet Commonly known as:  GLUCOPHAGE Take 500 mg by mouth 2 (two) times daily with a meal.   methocarbamol 500 MG tablet Commonly known as:  ROBAXIN Take 500 mg by mouth 3 (three) times daily.   nicotine 21 mg/24hr patch Commonly known as:  NICODERM CQ - dosed in mg/24 hours Place 1 patch (21 mg total) onto the skin daily.   pantoprazole 40 MG tablet Commonly known as:  PROTONIX Take 40 mg by mouth daily.   predniSONE 10 MG tablet Commonly known as:  DELTASONE Take 4 tabs daily for 3 days, then 3 tabs daily for 3 days, then 2 tabs daily for 3 days, then 1 tab daily  for 3 days, then stop. What changed:  medication strength  how much to take  how to take this  when to take this  additional instructions   pregabalin 75 MG capsule Commonly known as:  LYRICA Take 150 mg by mouth 2 (two) times daily.   sennosides-docusate sodium 8.6-50 MG tablet Commonly known as:  SENOKOT-S Take 1 tablet by mouth daily.   sertraline 100 MG tablet Commonly known as:  ZOLOFT Take 100 mg by mouth daily.      Follow-up Information    Silvio Pate, PA-C Follow up on 03/09/2016.   Specialty:  Physician Assistant Why:  @10 :30am Contact information: [address] [place] [address] [phone]          Allergies   Allergen Reactions  . Asa [Aspirin] Other (See Comments)    Stomach burns   . Morphine And Related Itching  . Oxycodone     "it makes me feel crazy"       Procedures/Studies: Dg Chest 2 View  Result Date: 02/28/2016 CLINICAL DATA:  Shortness of breath and cough EXAM: CHEST  2 VIEW COMPARISON:  February 24, 2016 FINDINGS: There is mild atelectatic change in the right base. Lungs elsewhere are clear. Heart size and pulmonary vascularity are normal. There is atherosclerotic calcification in the aorta. No adenopathy. No bone lesions. IMPRESSION: Right base atelectasis. No edema or consolidation. Aortic atherosclerosis. Electronically Signed   By: Lowella Grip III M.D.   On: 02/28/2016 11:15   Dg Chest 2 View  Result Date: 02/24/2016 CLINICAL DATA:  Cough and shortness of breath began 2 days ago. Current smoker. History of gastroesophageal reflux. EXAM: CHEST  2 VIEW COMPARISON:  None in PACs FINDINGS: The lungs are well-expanded. The interstitial markings are coarse bilaterally. The heart and pulmonary vascularity are normal. The mediastinum is normal in width. The bony thorax exhibits no acute abnormality. IMPRESSION: Mild interstitial prominence may be acute or chronic. If acute this could reflect early interstitial pneumonia or less likely interstitial edema. If chronic the findings may reflect this patient's smoking history and associated chronic bronchitis. Electronically Signed   By: David  Martinique M.D.   On: 02/24/2016 07:38      Subjective: Continues to gradually improve. Chest soreness from coughing continues to improve. No dyspnea and not much coughing at rest. Mild and intermittent dyspnea and coughing with exertion. Cough is nonproductive. No dizziness or lightheadedness. Denies any other complaints. Requests nicotine patch at discharge.  Discharge Exam:  Vitals:   03/01/16 1946 03/01/16 2109 03/02/16 0600 03/02/16 0849  BP: 129/74  133/75   Pulse: 77  61   Resp: 20  18    Temp: 98.7 F (37.1 C)  98 F (36.7 C)   TempSrc: Oral  Oral   SpO2: 98% 98% 100% 99%  Weight:   65.5 kg (144 lb 8 oz)   Height:        General exam: Pleasant middle-aged female sitting up comfortably in bed without distress. Respiratory system: Clear to auscultation. No increased work of breathing and able to speak in full sentences.  Cardiovascular system: S1 & S2 heard, RRR. No JVD, murmurs, rubs, gallops or clicks. No pedal edema.  Gastrointestinal system: Abdomen is nondistended, soft and nontender. No organomegaly or masses felt. Normal bowel sounds heard. Central nervous system: Alert and oriented. No focal neurological deficits. Extremities: Symmetric 5 x 5 power. Skin: No rashes, lesions or ulcers Psychiatry: Judgement and insight appear normal. Mood & affect slightly  anxious but seems baseline.     The results of significant diagnostics from this hospitalization (including imaging, microbiology, ancillary and laboratory) are listed below for reference.     Microbiology: No results found for this or any previous visit (from the past 240 hour(s)).   Labs: BNP (last 3 results)  Recent Labs  02/24/16 0954  BNP [id number]   Basic Metabolic Panel:  Recent Labs Lab 02/28/16 1045  NA 138  K 3.7  CL 105  CO2 23  GLUCOSE 126*  BUN 13  CREATININE 0.89  CALCIUM 9.7   CBC:  Recent Labs Lab 02/28/16 1045  WBC 8.2  NEUTROABS 3.5  HGB 12.2  HCT 36.2  MCV 88.7  PLT 230   CBG:  Recent Labs Lab 03/01/16 0602 03/01/16 1153 03/01/16 1620 03/01/16 2201 03/02/16 0607  GLUCAP 153* 150* 121* 118* 185*      Time coordinating discharge: Over 30 minutes  SIGNED:  Vernell Leep, MD, FACP, FHM. [facility] Pager [phone]  If 7PM-7AM, please contact night-coverage [email or web address] Password [facility] 03/02/2016, 10:57 AM

## 2016-03-05 ENCOUNTER — Emergency Department (HOSPITAL_COMMUNITY)

## 2016-03-05 ENCOUNTER — Encounter (HOSPITAL_COMMUNITY): Payer: Self-pay | Admitting: Emergency Medicine

## 2016-03-05 ENCOUNTER — Emergency Department (HOSPITAL_COMMUNITY)
Admission: EM | Admit: 2016-03-05 | Discharge: 2016-03-06 | Disposition: A | Discharge status: Home / Self Care | Attending: Emergency Medicine | Admitting: Emergency Medicine

## 2016-03-05 DIAGNOSIS — J449 Chronic obstructive pulmonary disease, unspecified: Secondary | ICD-10-CM | POA: Diagnosis not present

## 2016-03-05 DIAGNOSIS — K5900 Constipation, unspecified: Secondary | ICD-10-CM | POA: Diagnosis not present

## 2016-03-05 DIAGNOSIS — M79606 Pain in leg, unspecified: Secondary | ICD-10-CM

## 2016-03-05 DIAGNOSIS — Z7984 Long term (current) use of oral hypoglycemic drugs: Secondary | ICD-10-CM | POA: Diagnosis not present

## 2016-03-05 DIAGNOSIS — Z79899 Other long term (current) drug therapy: Secondary | ICD-10-CM | POA: Insufficient documentation

## 2016-03-05 DIAGNOSIS — F17200 Nicotine dependence, unspecified, uncomplicated: Secondary | ICD-10-CM | POA: Insufficient documentation

## 2016-03-05 DIAGNOSIS — M79604 Pain in right leg: Secondary | ICD-10-CM | POA: Diagnosis not present

## 2016-03-05 DIAGNOSIS — I10 Essential (primary) hypertension: Secondary | ICD-10-CM | POA: Diagnosis not present

## 2016-03-05 DIAGNOSIS — M546 Pain in thoracic spine: Secondary | ICD-10-CM | POA: Insufficient documentation

## 2016-03-05 DIAGNOSIS — M79605 Pain in left leg: Secondary | ICD-10-CM | POA: Diagnosis not present

## 2016-03-05 DIAGNOSIS — M549 Dorsalgia, unspecified: Secondary | ICD-10-CM

## 2016-03-05 LAB — COMPREHENSIVE METABOLIC PANEL
ALBUMIN: 4.1 g/dL (ref 3.5–5.0)
ALK PHOS: 117 U/L (ref 38–126)
ALT: 27 U/L (ref 14–54)
ANION GAP: 10 (ref 5–15)
AST: 22 U/L (ref 15–41)
BUN: 11 mg/dL (ref 6–20)
CALCIUM: 8.9 mg/dL (ref 8.9–10.3)
CO2: 21 mmol/L — ABNORMAL LOW (ref 22–32)
Chloride: 103 mmol/L (ref 101–111)
Creatinine, Ser: 0.82 mg/dL (ref 0.44–1.00)
GFR calc Af Amer: >60 mL/min (ref >60)
GFR calc non Af Amer: >60 mL/min (ref >60)
Glucose, Bld: 104 mg/dL — ABNORMAL HIGH (ref 65–99)
Potassium: 4 mmol/L (ref 3.5–5.1)
SODIUM: 134 mmol/L — AB (ref 135–145)
Total Bilirubin: 0.7 mg/dL (ref 0.3–1.2)
Total Protein: 7.3 g/dL (ref 6.5–8.1)

## 2016-03-05 LAB — CBC WITH DIFFERENTIAL/PLATELET
BASOS ABS: 0 10*3/uL (ref 0.0–0.1)
BASOS PCT: 0 %
EOS ABS: 0.3 10*3/uL (ref 0.0–0.7)
Eosinophils Relative: 3 %
HCT: 37.7 % (ref 36.0–46.0)
HEMOGLOBIN: 12.8 g/dL (ref 12.0–15.0)
Lymphocytes Relative: 41 %
Lymphs Abs: 4.2 10*3/uL — ABNORMAL HIGH (ref 0.7–4.0)
MCH: 30.3 pg (ref 26.0–34.0)
MCHC: 34 g/dL (ref 30.0–36.0)
MCV: 89.1 fL (ref 78.0–100.0)
Monocytes Absolute: 0.8 10*3/uL (ref 0.1–1.0)
Monocytes Relative: 8 %
NEUTROS PCT: 48 %
Neutro Abs: 4.9 10*3/uL (ref 1.7–7.7)
Platelets: 175 10*3/uL (ref 150–400)
RBC: 4.23 MIL/uL (ref 3.87–5.11)
RDW: 14.5 % (ref 11.5–15.5)
WBC: 10.2 10*3/uL (ref 4.0–10.5)

## 2016-03-05 LAB — URINALYSIS, ROUTINE W REFLEX MICROSCOPIC
Bilirubin Urine: NEGATIVE
Glucose, UA: NEGATIVE mg/dL
Hgb urine dipstick: NEGATIVE
KETONES UR: NEGATIVE mg/dL
LEUKOCYTES UA: NEGATIVE
Nitrite: NEGATIVE
Protein, ur: NEGATIVE mg/dL
Specific Gravity, Urine: <1.005 — ABNORMAL LOW (ref 1.005–1.030)
pH: 6 (ref 5.0–8.0)

## 2016-03-05 LAB — LIPASE, BLOOD: Lipase: 41 U/L (ref 11–51)

## 2016-03-05 MED ORDER — IOPAMIDOL (ISOVUE-370) INJECTION 76%
INTRAVENOUS | Status: AC
  Administered 2016-03-05: 100 mL
  Filled 2016-03-05: qty 100

## 2016-03-05 MED ORDER — FENTANYL CITRATE (PF) 100 MCG/2ML IJ SOLN
50.0000 ug | INTRAMUSCULAR | Status: DC | PRN
  Administered 2016-03-05 (×2): 50 ug via INTRAVENOUS
  Filled 2016-03-05 (×2): qty 2

## 2016-03-05 NOTE — ED Triage Notes (Signed)
Pt arrived via EMS from home with multiple complaints. She was recently daignosed with pneumonia a few days ago. Currently complaining of generalized pain in her chest, bilateral flanks, and abdomen.

## 2016-03-05 NOTE — ED Provider Notes (Signed)
[facility] DEPT Provider Note   CSN: [id number] Arrival date & time: 03/05/16  1928     History   Chief Complaint Chief Complaint  Patient presents with  . Chest Pain  . Flank Pain  . Abdominal Pain    HPI [name] is a 56 y.o. female with a hx of GERD, HTN, PTSD, COPD presents to the Emergency Department via EMS with multiple complaints.  She is complaining of chest pain, bilateral flank pain and abdominal pain.  Patient reports she felt well upon discharge from the hospital yesterday when she got home her legs began to hurt. She reports this has progressed throughout the night and into today. She reports pain is a 10/10 and described as an aching. She reports the last time this happened she was hypokalemic.  She reports long history of chronic low back pain but today's pain is "different."  She reports the pain is some higher in her back. No falls or recent trauma. Patient denies dysuria, hematuria or vaginal discharge. She denies weakness in her legs, loss of bowel or bladder control, saddle anesthesia, numbness or tingling in her extremities.  Record review shows that patient was seen in the emergency department on 02/24/2015 with COPD exacerbation and atypical pneumonia initially thought to be influenza versus pneumonia and DC'd home with Levaquin. Patient did not fill any of her prescriptions and has had progressively worsening symptoms.  She was then seen again on 02/28/2016 for worsening shortness of breath, wheezing and nonproductive cough and at that time was requiring supplemental oxygen to maintain her oxygen saturations. She was discharged home on 03/02/2016 without supplemental oxygen.    The history is provided by the patient and medical records. No language interpreter was used.    Past Medical History:  Diagnosis Date  . GERD (gastroesophageal reflux disease)   . Hypertension   . PTSD (post-traumatic stress disorder)   . Renal disorder     Patient  Active Problem List   Diagnosis Date Noted  . COPD exacerbation ([place]) 02/28/2016  . Tobacco abuse 02/28/2016  . GERD (gastroesophageal reflux disease) 02/28/2016  . Essential hypertension 02/28/2016  . Acute bronchitis 02/28/2016    Past Surgical History:  Procedure Laterality Date  . c-section     . URETHROPLASTY      OB History    No data available       Home Medications    Prior to Admission medications   Medication Sig Start Date End Date Taking? Authorizing Provider  acetaminophen (TYLENOL) 325 MG tablet Take 2 tablets (650 mg total) by mouth every 6 (six) hours as needed for mild pain, moderate pain, fever or headache (or Fever >/= 101). 03/02/16  Yes Modena Jansky, MD  albuterol (PROVENTIL HFA;VENTOLIN HFA) 108 (90 Base) MCG/ACT inhaler Inhale 2 puffs into the lungs every 6 (six) hours as needed for wheezing or shortness of breath. 03/02/16  Yes Modena Jansky, MD  amLODipine (NORVASC) 10 MG tablet Take 10 mg by mouth daily.   Yes Historical Provider, MD  atorvastatin (LIPITOR) 20 MG tablet Take 20 mg by mouth daily.   Yes Historical Provider, MD  benzonatate (TESSALON) 200 MG capsule Take 1 capsule (200 mg total) by mouth 3 (three) times daily. 03/02/16  Yes Modena Jansky, MD  buPROPion (WELLBUTRIN) 75 MG tablet Take 75 mg by mouth 2 (two) times daily.   Yes Historical Provider, MD  chlorpheniramine-HYDROcodone (TUSSIONEX) 10-8 MG/5ML SUER Take 5 mLs by mouth every 12 (twelve) hours  as needed for cough. 03/02/16  Yes Modena Jansky, MD  clotrimazole-betamethasone (LOTRISONE) cream Apply 1 application topically at bedtime.   Yes Historical Provider, MD  Ipratropium-Albuterol (COMBIVENT RESPIMAT) 20-100 MCG/ACT AERS respimat Inhale 1 puff into the lungs 3 (three) times daily. 03/02/16  Yes Modena Jansky, MD  lidocaine (LIDODERM) 5 % Place 1 patch onto the skin daily. Remove & Discard patch within 12 hours or as directed by MD   Yes Historical Provider, MD  LORazepam  (ATIVAN) 0.5 MG tablet Take 0.5 mg by mouth every 8 (eight) hours as needed for anxiety.   Yes Historical Provider, MD  losartan (COZAAR) 25 MG tablet Take 25 mg by mouth daily.   Yes Historical Provider, MD  metFORMIN (GLUCOPHAGE) 500 MG tablet Take 500 mg by mouth 2 (two) times daily with a meal.   Yes Historical Provider, MD  pantoprazole (PROTONIX) 40 MG tablet Take 40 mg by mouth daily.   Yes Historical Provider, MD  predniSONE (DELTASONE) 10 MG tablet Take 4 tabs daily for 3 days, then 3 tabs daily for 3 days, then 2 tabs daily for 3 days, then 1 tab daily for 3 days, then stop. 03/02/16  Yes Modena Jansky, MD  pregabalin (LYRICA) 75 MG capsule Take 150 mg by mouth 2 (two) times daily.   Yes Historical Provider, MD  sennosides-docusate sodium (SENOKOT-S) 8.6-50 MG tablet Take 1 tablet by mouth daily.   Yes Historical Provider, MD  sertraline (ZOLOFT) 100 MG tablet Take 100 mg by mouth daily.   Yes Historical Provider, MD  HYDROcodone-acetaminophen (NORCO/VICODIN) 5-325 MG tablet Take 1-2 tablets by mouth every 6 (six) hours as needed for moderate pain or severe pain. 03/06/16   Lamyah Creed, PA-C  methocarbamol (ROBAXIN) 500 MG tablet Take 1 tablet (500 mg total) by mouth 2 (two) times daily. 03/06/16   Adelynne Joerger, PA-C  nicotine (NICODERM CQ - DOSED IN MG/24 HOURS) 21 mg/24hr patch Place 1 patch (21 mg total) onto the skin daily. Patient not taking: Reported on 03/05/2016 03/02/16   Modena Jansky, MD  polyethylene glycol powder (GLYCOLAX/MIRALAX) powder Take 17 g by mouth 2 (two) times daily. 03/06/16   Jarrett Soho Idalee Foxworthy, PA-C    Family History History reviewed. No pertinent family history.  Social History Social History  Substance Use Topics  . Smoking status: Current Some Day Smoker  . Smokeless tobacco: Never Used  . Alcohol use Yes     Allergies   Asa [aspirin]; Morphine and related; and Oxycodone   Review of Systems Review of Systems  Respiratory:  Positive for cough and shortness of breath.   Cardiovascular: Positive for chest pain.  Gastrointestinal: Positive for abdominal pain.  Genitourinary: Positive for flank pain.  Musculoskeletal: Positive for arthralgias ( bilateral leg pain) and back pain.  All other systems reviewed and are negative.    Physical Exam Updated Vital Signs BP 146/95 (BP Location: Right Arm)   Pulse 86   Temp 98.3 F (36.8 C) (Oral)   Resp 16   Ht 5\' 5"  (1.651 m)   Wt 65.3 kg   LMP  (LMP Unknown)   SpO2 99%   BMI 23.96 kg/m   Physical Exam  Constitutional: She appears well-developed and well-nourished. No distress.  Awake, alert, nontoxic appearance  HENT:  Head: Normocephalic and atraumatic.  Mouth/Throat: Oropharynx is clear and moist. No oropharyngeal exudate.  Eyes: Conjunctivae are normal. No scleral icterus.  Neck: Normal range of motion. Neck supple.  Full ROM without  pain  Cardiovascular: Normal rate, regular rhythm and intact distal pulses.   Pulmonary/Chest: Effort normal and breath sounds normal. No respiratory distress. She has no wheezes.  Equal chest expansion  Abdominal: Soft. Bowel sounds are normal. She exhibits no distension and no mass. There is generalized tenderness. There is guarding ( voluntary). There is no rebound.  Musculoskeletal: Normal range of motion. She exhibits no edema.  Decreased range of motion of the T-spine and L-spine due to pain No midline tenderness to the  T-spine or L-spine Moderate tenderness to palpation of the paraspinous muscles of the Mid and lower T spine and entire L-spine FROM of the bilateral knees, ankles and some limited flexion of the hips due to pain  Lymphadenopathy:    She has no cervical adenopathy.  Neurological: She is alert.  Speech is clear and goal oriented, follows commands Normal 5/5 strength in upper and lower extremities bilaterally including dorsiflexion and plantar flexion, strong and equal grip strength Sensation normal  to light and sharp touch Moves extremities without ataxia, coordination intact Pt refuses to attempt walking due to pain No Clonus  Skin: Skin is warm and dry. No rash noted. She is not diaphoretic. No erythema.  Psychiatric: She has a normal mood and affect. Her behavior is normal.  Nursing note and vitals reviewed.    ED Treatments / Results  Labs (all labs ordered are listed, but only abnormal results are displayed) Labs Reviewed  CBC WITH DIFFERENTIAL/PLATELET - Abnormal; Notable for the following:       Result Value   Lymphs Abs 4.2 (*)    All other components within normal limits  COMPREHENSIVE METABOLIC PANEL - Abnormal; Notable for the following:    Sodium 134 (*)    CO2 21 (*)    Glucose, Bld 104 (*)    All other components within normal limits  URINALYSIS, ROUTINE W REFLEX MICROSCOPIC - Abnormal; Notable for the following:    Specific Gravity, Urine <1.005 (*)    All other components within normal limits  LIPASE, BLOOD    EKG  EKG Interpretation  Date/Time:  Thursday March 05 2016 19:38:47 EST Ventricular Rate:  88 PR Interval:    QRS Duration: 81 QT Interval:  352 QTC Calculation: 426 R Axis:   66 Text Interpretation:  Sinus rhythm Ventricular premature complex Consider left ventricular hypertrophy Confirmed by Alvino Chapel  MD, Ovid Curd [phone]) on 03/05/2016 9:34:16 PM        Radiology Ct L-spine No Charge  Result Date: 03/05/2016 CLINICAL DATA:  Chest pain, back and abdominal pain, leg pain, smoking history. Evaluate for dissection. Recent diagnosis of pneumonia a few days ago. EXAM: CT ANGIOGRAPHY CHEST, ABDOMEN AND PELVIS CT LUMBAR SPINE TECHNIQUE: Multidetector CT imaging through the chest, abdomen and pelvis was performed using the standard protocol during bolus administration of intravenous contrast. Multiplanar reconstructed images and MIPs were obtained and reviewed to evaluate the vascular anatomy. CONTRAST:  100 cc Isovue 370 COMPARISON:  None.  FINDINGS: CTA CHEST FINDINGS Cardiovascular: Scattered atherosclerotic changes of the normal caliber thoracic aorta. No thoracic aortic dissection. Mild cardiomegaly. No pericardial effusion. Coronary artery calcifications. Mediastinum/Nodes: No mass or enlarged lymph nodes appreciated within the mediastinum, perihilar or axillary regions. Esophagus appears normal. Trachea and central bronchi are unremarkable. Lungs/Pleura: Patchy ill-defined consolidations within the lower lobes bilaterally, most suggestive of atelectasis or mild asymmetric edema. Upper lungs are clear. No evidence of pneumonia. No pleural effusion or pneumothorax. Musculoskeletal: Mild degenerative change within the thoracic spine. No acute  or suspicious osseous finding. Review of the MIP images confirms the above findings. CTA ABDOMEN AND PELVIS FINDINGS VASCULAR Aorta: No abdominal aortic aneurysm or dissection. Fairly heavy atherosclerotic changes along the walls of the normal caliber abdominal aorta. Celiac: Patent without evidence of aneurysm, dissection, vasculitis or significant stenosis. SMA: Patent without evidence of aneurysm, dissection, vasculitis or significant stenosis. Renals: Prominent atherosclerotic calcifications at each renal artery ostium. Normal contrast flow into the distal renal arteries. IMA: Patent without evidence of aneurysm, dissection, vasculitis or significant stenosis. Inflow: Patent without evidence of aneurysm, dissection, vasculitis or significant stenosis. Veins: No obvious venous abnormality within the limitations of this arterial phase study. Review of the MIP images confirms the above findings. NON-VASCULAR Hepatobiliary: No focal liver abnormality is seen. No gallstones, gallbladder wall thickening, or biliary dilatation. Pancreas: Unremarkable. No pancreatic ductal dilatation or surrounding inflammatory changes. Spleen: Normal in size without focal abnormality. Adrenals/Urinary Tract: Adrenal glands appear  normal. Kidneys are unremarkable without mass, stone or hydronephrosis, perhaps mild cortical scarring bilaterally. No perinephric fluid/inflammation. No ureteral or bladder calculi identified. Bladder appears normal, partially decompressed. Stomach/Bowel: Bowel is normal in caliber. No bowel wall thickening or evidence of bowel wall inflammation seen. Appendix is not convincingly seen but there are no inflammatory changes about the cecum to suggest acute appendicitis. Stomach appears normal. Fairly large amount of stool throughout the colon. Lymphatic: No enlarged or morphologically abnormal lymph nodes are identified within the abdomen or pelvis. Reproductive: Unremarkable. Other: No free fluid or abscess collection seen. No free intraperitoneal air. Musculoskeletal: Mild degenerative change within the lumbar spine. No acute or suspicious osseous finding. Review of the MIP images confirms the above findings. LUMBAR SPINE: Disc desiccation at the L3-4 level with associated osseous spurring and vacuum disc phenomenon. Associated disc bulge causing mild central canal stenosis and fairly prominent bilateral neural foramen stenoses with possible associated nerve root impingements. Remainder of the disc spaces appear well preserved. No acute or suspicious osseous finding. IMPRESSION: 1. No aortic aneurysm or dissection, thoracic or abdominal. 2. Patchy ill-defined consolidations within the lower lobes bilaterally, most suggestive of atelectasis and/or mild edema. Pneumonia is considered less likely. 3. Mild cardiomegaly. 4. Fairly large amount of stool throughout the colon (constipation? ). No bowel obstruction or evidence of bowel wall inflammation. 5. Aortic atherosclerosis. 6. Degenerative disc desiccation at the L3-4 level with associated broad-based disc bulge causing only mild central canal stenosis but moderate to severe neural foramen stenoses bilaterally with possible associated nerve root impingements. If  symptoms appear to be radiculopathic in nature, would consider further characterization with lumbar spine MRI. No acute findings of the lumbar spine. Electronically Signed   By: Franki Cabot M.D.   On: 03/05/2016 22:50   Ct Angio Chest/abd/pel For Dissection W And/or Wo Contrast  Result Date: 03/05/2016 CLINICAL DATA:  Chest pain, back and abdominal pain, leg pain, smoking history. Evaluate for dissection. Recent diagnosis of pneumonia a few days ago. EXAM: CT ANGIOGRAPHY CHEST, ABDOMEN AND PELVIS CT LUMBAR SPINE TECHNIQUE: Multidetector CT imaging through the chest, abdomen and pelvis was performed using the standard protocol during bolus administration of intravenous contrast. Multiplanar reconstructed images and MIPs were obtained and reviewed to evaluate the vascular anatomy. CONTRAST:  100 cc Isovue 370 COMPARISON:  None. FINDINGS: CTA CHEST FINDINGS Cardiovascular: Scattered atherosclerotic changes of the normal caliber thoracic aorta. No thoracic aortic dissection. Mild cardiomegaly. No pericardial effusion. Coronary artery calcifications. Mediastinum/Nodes: No mass or enlarged lymph nodes appreciated within the mediastinum, perihilar or axillary  regions. Esophagus appears normal. Trachea and central bronchi are unremarkable. Lungs/Pleura: Patchy ill-defined consolidations within the lower lobes bilaterally, most suggestive of atelectasis or mild asymmetric edema. Upper lungs are clear. No evidence of pneumonia. No pleural effusion or pneumothorax. Musculoskeletal: Mild degenerative change within the thoracic spine. No acute or suspicious osseous finding. Review of the MIP images confirms the above findings. CTA ABDOMEN AND PELVIS FINDINGS VASCULAR Aorta: No abdominal aortic aneurysm or dissection. Fairly heavy atherosclerotic changes along the walls of the normal caliber abdominal aorta. Celiac: Patent without evidence of aneurysm, dissection, vasculitis or significant stenosis. SMA: Patent without  evidence of aneurysm, dissection, vasculitis or significant stenosis. Renals: Prominent atherosclerotic calcifications at each renal artery ostium. Normal contrast flow into the distal renal arteries. IMA: Patent without evidence of aneurysm, dissection, vasculitis or significant stenosis. Inflow: Patent without evidence of aneurysm, dissection, vasculitis or significant stenosis. Veins: No obvious venous abnormality within the limitations of this arterial phase study. Review of the MIP images confirms the above findings. NON-VASCULAR Hepatobiliary: No focal liver abnormality is seen. No gallstones, gallbladder wall thickening, or biliary dilatation. Pancreas: Unremarkable. No pancreatic ductal dilatation or surrounding inflammatory changes. Spleen: Normal in size without focal abnormality. Adrenals/Urinary Tract: Adrenal glands appear normal. Kidneys are unremarkable without mass, stone or hydronephrosis, perhaps mild cortical scarring bilaterally. No perinephric fluid/inflammation. No ureteral or bladder calculi identified. Bladder appears normal, partially decompressed. Stomach/Bowel: Bowel is normal in caliber. No bowel wall thickening or evidence of bowel wall inflammation seen. Appendix is not convincingly seen but there are no inflammatory changes about the cecum to suggest acute appendicitis. Stomach appears normal. Fairly large amount of stool throughout the colon. Lymphatic: No enlarged or morphologically abnormal lymph nodes are identified within the abdomen or pelvis. Reproductive: Unremarkable. Other: No free fluid or abscess collection seen. No free intraperitoneal air. Musculoskeletal: Mild degenerative change within the lumbar spine. No acute or suspicious osseous finding. Review of the MIP images confirms the above findings. LUMBAR SPINE: Disc desiccation at the L3-4 level with associated osseous spurring and vacuum disc phenomenon. Associated disc bulge causing mild central canal stenosis and  fairly prominent bilateral neural foramen stenoses with possible associated nerve root impingements. Remainder of the disc spaces appear well preserved. No acute or suspicious osseous finding. IMPRESSION: 1. No aortic aneurysm or dissection, thoracic or abdominal. 2. Patchy ill-defined consolidations within the lower lobes bilaterally, most suggestive of atelectasis and/or mild edema. Pneumonia is considered less likely. 3. Mild cardiomegaly. 4. Fairly large amount of stool throughout the colon (constipation? ). No bowel obstruction or evidence of bowel wall inflammation. 5. Aortic atherosclerosis. 6. Degenerative disc desiccation at the L3-4 level with associated broad-based disc bulge causing only mild central canal stenosis but moderate to severe neural foramen stenoses bilaterally with possible associated nerve root impingements. If symptoms appear to be radiculopathic in nature, would consider further characterization with lumbar spine MRI. No acute findings of the lumbar spine. Electronically Signed   By: Franki Cabot M.D.   On: 03/05/2016 22:50    Procedures Procedures (including critical care time)  Medications Ordered in ED Medications  fentaNYL (SUBLIMAZE) injection 50 mcg (50 mcg Intravenous Given 03/05/16 2248)  iopamidol (ISOVUE-370) 76 % injection (100 mLs  Contrast Given 03/05/16 2154)  methocarbamol (ROBAXIN) tablet 500 mg (500 mg Oral Given 03/06/16 0021)  oxyCODONE (Oxy IR/ROXICODONE) immediate release tablet 5 mg (5 mg Oral Given 03/06/16 0021)     Initial Impression / Assessment and Plan / ED Course  I have reviewed  the triage vital signs and the nursing notes.  Pertinent labs & imaging results that were available during my care of the patient were reviewed by me and considered in my medical decision making (see chart for details).  Clinical Course as of Mar 06 44  Fri Mar 06, 2016  0036 Patient ambulates without assistance while using a walker. Her vital signs have remained  stable throughout her time in the emergency department. Patient is without weakness in her legs.  Labs are reassuring. No evidence of UTI.  Patient is without shortness of breath or hypoxia. She denies difficulty breathing.  [HM]  0037 Ct chest abd pelvis: No aortic aneurysm or dissection. Patchy consolidations in the lower lobes. Patient is currently being treated for pneumonia. Mild cardiomegaly. Evidence of large stool burden in the bowel without inflammation or bowel obstruction.  Degenerative disc desiccation is noted at the L3-4 level with broad-based disc bulge. Patient reports this is where her pain normally is but is not there tonight. Pain is higher in the lower thoracic region. Symptoms do not appear to be radicular from the L-spine. She is without weakness in her lower extremities. This time I believe that her lumbar MRI can be completed in the outpatient setting.  [HM]  0041 [place] narcotic drug database reviewed with only 1 Rx from 10/03/15 for tramadol  [HM]    Clinical Course User Index [HM] Jarrett Soho Merrick Feutz, PA-C    Pt with Flank pain and back pain in addition to abdominal pain. She complained of chest pain at triage but denied his pain to me. Patient denies shortness of breath. No tachycardia or hypoxia. Patient tearful on initial exam but this resolved after 1 dose of pain medication and she slept soundly. CT abdomen chest and pelvis is without evidence of dissection, PE, AAA or other intra-abdominal pathology. Constipation noted. 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 She ambulates without difficulty or assistance here in the emergency department while using a walker. Patient does have a walker at home. Labs are reassuring. No evidence of UTI. No evidence of pyelonephritis. Patient without fever. She was discharged home with antibiotics yesterday to treat her possible pneumonia. CT scan shows  no large focal consolidations. Patient will be discharged home with a short course of narcotics and muscle relaxers. She is to see her primary care physician tomorrow and return to emergency department for worsening symptoms.  The patient was discussed with and seen by Dr. Alvino Chapel who agrees with the treatment plan.   Final Clinical Impressions(s) / ED Diagnoses   Final diagnoses:  Back pain  Leg pain  Acute bilateral thoracic back pain  Constipation, unspecified constipation type    New Prescriptions New Prescriptions   HYDROCODONE-ACETAMINOPHEN (NORCO/VICODIN) 5-325 MG TABLET    Take 1-2 tablets by mouth every 6 (six) hours as needed for moderate pain or severe pain.   METHOCARBAMOL (ROBAXIN) 500 MG TABLET    Take 1 tablet (500 mg total) by mouth 2 (two) times daily.   POLYETHYLENE GLYCOL POWDER (GLYCOLAX/MIRALAX) POWDER    Take 17 g by mouth 2 (two) times daily.     Jarrett Soho Korben Carcione, PA-C 03/06/16 [place], MD 03/06/16 [phone]

## 2016-03-06 MED ORDER — METHOCARBAMOL 500 MG PO TABS
500.0000 mg | ORAL_TABLET | Freq: Once | ORAL | Status: AC
  Administered 2016-03-06: 500 mg via ORAL
  Filled 2016-03-06: qty 1

## 2016-03-06 MED ORDER — HYDROCODONE-ACETAMINOPHEN 5-325 MG PO TABS
1.0000 | ORAL_TABLET | Freq: Four times a day (QID) | ORAL | 0 refills | Status: DC | PRN
Start: 2016-03-06 — End: 2016-09-07

## 2016-03-06 MED ORDER — METHOCARBAMOL 500 MG PO TABS: 500.0000 mg | ORAL_TABLET | Freq: Two times a day (BID) | ORAL | 0 refills | Status: DC

## 2016-03-06 MED ORDER — POLYETHYLENE GLYCOL 3350 17 GM/SCOOP PO POWD: 17.0000 g | Freq: Two times a day (BID) | ORAL | 0 refills | Status: DC

## 2016-03-06 MED ORDER — OXYCODONE HCL 5 MG PO TABS
5.0000 mg | ORAL_TABLET | Freq: Once | ORAL | Status: AC
  Administered 2016-03-06: 5 mg via ORAL
  Filled 2016-03-06: qty 1

## 2016-03-06 NOTE — Discharge Instructions (Signed)
1. Medications: robaxin, vicodin, MiraLAX, usual home medications 2. Treatment: rest, drink plenty of fluids, gentle stretching as discussed, alternate ice and heat 3. Follow Up: Please followup with your primary doctor in 1 day for discussion of your diagnoses and further evaluation after today's visit; if you do not have a primary care doctor use the resource guide provided to find one;  Return to the ER for worsening back pain, difficulty walking, loss of bowel or bladder control, numbness, tingling or other concerning symptoms

## 2016-03-06 NOTE — ED Notes (Signed)
Pt ambulated with use of walker in hallway; steady gait noted; pt c/o pain in bilateral groin, back, and calves

## 2016-04-15 ENCOUNTER — Other Ambulatory Visit: Payer: Self-pay | Admitting: Physician Assistant

## 2016-04-15 DIAGNOSIS — Z1231 Encounter for screening mammogram for malignant neoplasm of breast: Secondary | ICD-10-CM

## 2016-08-10 ENCOUNTER — Ambulatory Visit: Admitting: Family

## 2016-09-03 ENCOUNTER — Ambulatory Visit: Admitting: Family

## 2016-09-07 ENCOUNTER — Encounter: Payer: Self-pay | Admitting: Family

## 2016-09-07 ENCOUNTER — Ambulatory Visit (INDEPENDENT_AMBULATORY_CARE_PROVIDER_SITE_OTHER): Admitting: Family

## 2016-09-07 ENCOUNTER — Other Ambulatory Visit (INDEPENDENT_AMBULATORY_CARE_PROVIDER_SITE_OTHER)

## 2016-09-07 VITALS — BP 160/88 | HR 60 | Temp 98.8°F | Resp 16 | Ht 65.0 in | Wt 134.1 lb

## 2016-09-07 DIAGNOSIS — E119 Type 2 diabetes mellitus without complications: Secondary | ICD-10-CM

## 2016-09-07 DIAGNOSIS — I10 Essential (primary) hypertension: Secondary | ICD-10-CM

## 2016-09-07 DIAGNOSIS — E1140 Type 2 diabetes mellitus with diabetic neuropathy, unspecified: Secondary | ICD-10-CM | POA: Insufficient documentation

## 2016-09-07 DIAGNOSIS — F4310 Post-traumatic stress disorder, unspecified: Secondary | ICD-10-CM | POA: Insufficient documentation

## 2016-09-07 DIAGNOSIS — F322 Major depressive disorder, single episode, severe without psychotic features: Secondary | ICD-10-CM

## 2016-09-07 DIAGNOSIS — K219 Gastro-esophageal reflux disease without esophagitis: Secondary | ICD-10-CM

## 2016-09-07 DIAGNOSIS — Z720 Tobacco use: Secondary | ICD-10-CM

## 2016-09-07 DIAGNOSIS — G8921 Chronic pain due to trauma: Secondary | ICD-10-CM

## 2016-09-07 HISTORY — DX: Major depressive disorder, single episode, severe without psychotic features: F32.2

## 2016-09-07 HISTORY — DX: Type 2 diabetes mellitus without complications: E11.9

## 2016-09-07 HISTORY — DX: Chronic pain due to trauma: G89.21

## 2016-09-07 LAB — MICROALBUMIN / CREATININE URINE RATIO
Creatinine,U: 290.9 mg/dL
Microalb Creat Ratio: 0.3 mg/g (ref 0.0–30.0)
Microalb, Ur: 1 mg/dL (ref 0.0–1.9)

## 2016-09-07 LAB — COMPREHENSIVE METABOLIC PANEL
ALBUMIN: 4.3 g/dL (ref 3.5–5.2)
ALT: 15 U/L (ref 0–35)
AST: 19 U/L (ref 0–37)
Alkaline Phosphatase: 107 U/L (ref 39–117)
BUN: 13 mg/dL (ref 6–23)
CHLORIDE: 106 meq/L (ref 96–112)
CO2: 29 meq/L (ref 19–32)
Calcium: 9.8 mg/dL (ref 8.4–10.5)
Creatinine, Ser: 1 mg/dL (ref 0.40–1.20)
GFR: 74.42 mL/min (ref >60.00)
Glucose, Bld: 127 mg/dL — ABNORMAL HIGH (ref 70–99)
Potassium: 4.1 mEq/L (ref 3.5–5.1)
Sodium: 139 mEq/L (ref 135–145)
Total Bilirubin: 0.6 mg/dL (ref 0.2–1.2)
Total Protein: 7 g/dL (ref 6.0–8.3)

## 2016-09-07 LAB — CBC
HCT: 35.8 % — ABNORMAL LOW (ref 36.0–46.0)
Hemoglobin: 11.7 g/dL — ABNORMAL LOW (ref 12.0–15.0)
MCHC: 32.6 g/dL (ref 30.0–36.0)
MCV: 92.7 fl (ref 78.0–100.0)
Platelets: 165 10*3/uL (ref 150.0–400.0)
RBC: 3.87 Mil/uL (ref 3.87–5.11)
RDW: 13.8 % (ref 11.5–15.5)
WBC: 5.5 10*3/uL (ref 4.0–10.5)

## 2016-09-07 LAB — HEMOGLOBIN A1C: HEMOGLOBIN A1C: 7.7 % — AB (ref 4.6–6.5)

## 2016-09-07 LAB — VITAMIN D 25 HYDROXY (VIT D DEFICIENCY, FRACTURES): VITD: 13.94 ng/mL — AB (ref 30.00–100.00)

## 2016-09-07 LAB — TSH: TSH: 1.12 u[IU]/mL (ref 0.35–4.50)

## 2016-09-07 MED ORDER — PANTOPRAZOLE SODIUM 40 MG PO TBEC: 40.0000 mg | DELAYED_RELEASE_TABLET | Freq: Every day | ORAL | 0 refills | Status: DC

## 2016-09-07 MED ORDER — LORAZEPAM 0.5 MG PO TABS: 0.5000 mg | ORAL_TABLET | Freq: Every day | ORAL | 0 refills | Status: DC | PRN

## 2016-09-07 MED ORDER — TRAMADOL HCL 50 MG PO TABS: 50.0000 mg | ORAL_TABLET | Freq: Three times a day (TID) | ORAL | 0 refills | Status: DC | PRN

## 2016-09-07 MED ORDER — AMLODIPINE BESYLATE 10 MG PO TABS: 10.0000 mg | ORAL_TABLET | Freq: Every day | ORAL | 0 refills | Status: DC

## 2016-09-07 MED ORDER — BUPROPION HCL 75 MG PO TABS: 75.0000 mg | ORAL_TABLET | Freq: Two times a day (BID) | ORAL | 0 refills | Status: DC

## 2016-09-07 MED ORDER — SERTRALINE HCL 100 MG PO TABS: 100.0000 mg | ORAL_TABLET | Freq: Every day | ORAL | 1 refills | Status: DC

## 2016-09-07 MED ORDER — ATORVASTATIN CALCIUM 20 MG PO TABS: 20.0000 mg | ORAL_TABLET | Freq: Every day | ORAL | 0 refills | Status: DC

## 2016-09-07 MED ORDER — LOSARTAN POTASSIUM 25 MG PO TABS: 25.0000 mg | ORAL_TABLET | Freq: Every day | ORAL | 0 refills | Status: DC

## 2016-09-07 MED ORDER — TIZANIDINE HCL 4 MG PO TABS: 4.0000 mg | ORAL_TABLET | Freq: Two times a day (BID) | ORAL | 0 refills | Status: DC | PRN

## 2016-09-07 MED ORDER — ZOLPIDEM TARTRATE 10 MG PO TABS: 10.0000 mg | ORAL_TABLET | Freq: Every evening | ORAL | 0 refills | Status: DC | PRN

## 2016-09-07 MED ORDER — PREGABALIN 75 MG PO CAPS: 150.0000 mg | ORAL_CAPSULE | Freq: Two times a day (BID) | ORAL | 0 refills | Status: DC

## 2016-09-07 MED ORDER — METFORMIN HCL 500 MG PO TABS: 500.0000 mg | ORAL_TABLET | Freq: Two times a day (BID) | ORAL | 0 refills | Status: DC
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
## 2016-09-07 NOTE — Assessment & Plan Note (Signed)
Stable with current dosage of pantoprazole and no adverse side effects. Continue to monitor.

## 2016-09-07 NOTE — Patient Instructions (Addendum)
Thank you for choosing [facility].  SUMMARY AND INSTRUCTIONS:  Please STOP taking METHOCARBOMOL.  Continue to take your other medications as prescribed.  We will check your lab work today.  Recommend follow up with [facility].   They will call to schedule your appointment with non-surgical back specialists.   Medication:  Your prescription(s) have been submitted to your pharmacy or been printed and provided for you. Please take as directed and contact our office if you believe you are having problem(s) with the medication(s) or have any questions.  Labs:  Please stop by the lab on the lower level of the building for your blood work. Your results will be released to [place] (or called to you) after review, usually within 72 hours after test completion. If any changes need to be made, you will be notified at that same time.  1.) The lab is open from 7:30am to 5:30 pm Monday-Friday 2.) No appointment is necessary 3.) Fasting (if needed) is 6-8 hours after food and drink; black coffee and water are okay    Follow up:  If your symptoms worsen or fail to improve, please contact our office for further instruction, or in case of emergency go directly to the emergency room at the closest medical facility.

## 2016-09-07 NOTE — Assessment & Plan Note (Signed)
Blood pressure elevated above goal 140/90 with current medication regimen most likely related to increased depression and anxiety. Continue current dosage of losartan and amlodipine. Consider increasing losartan to 50 mg of blood pressure remains elevated in one month. Denies worst headache of life with any symptoms of end organ damage noted physical exam.

## 2016-09-07 NOTE — Assessment & Plan Note (Signed)
Severe major depression poorly controlled and without psychotic features. Recommend urgent referral to psychiatry and will evaluate [facility] services. Continue current dosage of sertraline and bupropion. Denies suicidal ideations. Follow-up pending referral to [facility] mental health services.

## 2016-09-07 NOTE — Assessment & Plan Note (Signed)
Obtain hemoglobin A1c, urine microalbumin, and CMET. Maintained on atorvastating and losartan for CAD risk reduction. Diabetic foot exam completed today. Encouraged to complete diabetic eye exam independently. Continue to monitor blood sugars at home at least once daily. Continue current dosage of metformin as prescribed pending A1c results. Will check on Pneumovax status.

## 2016-09-07 NOTE — Assessment & Plan Note (Signed)
Continues to smoke about 1 pack per day for about 35 years for 35 pack year history. In the pre-contemplation stage of change. Encouraged tobacco cessation to reduce risk for end organ damage and cardiovascular, respiratory, and malignant disease in the future. Continue to monitor.

## 2016-09-10 ENCOUNTER — Telehealth: Payer: Self-pay | Admitting: Family

## 2016-09-10 MED ORDER — VITAMIN D3 1.25 MG (50000 UT) PO TABS: 1.0000 | ORAL_TABLET | ORAL | 0 refills | Status: DC

## 2016-09-10 NOTE — Telephone Encounter (Signed)
Noted  

## 2016-09-10 NOTE — Addendum Note (Signed)
Addended by: Mauricio Po D on: 09/10/2016 09:42 AM   Modules accepted: Orders

## 2016-09-10 NOTE — Telephone Encounter (Signed)
Can you send in the Vitamin D prescription to the pts pharmacy? Thank you!

## 2016-09-21 ENCOUNTER — Encounter: Payer: Self-pay | Admitting: Family

## 2016-09-21 ENCOUNTER — Ambulatory Visit (INDEPENDENT_AMBULATORY_CARE_PROVIDER_SITE_OTHER): Admitting: Family

## 2016-09-21 VITALS — BP 142/80 | HR 50 | Temp 98.4°F | Resp 16 | Ht 65.0 in | Wt 127.8 lb

## 2016-09-21 DIAGNOSIS — I10 Essential (primary) hypertension: Secondary | ICD-10-CM | POA: Diagnosis not present

## 2016-09-21 DIAGNOSIS — F322 Major depressive disorder, single episode, severe without psychotic features: Secondary | ICD-10-CM

## 2016-09-21 NOTE — Assessment & Plan Note (Signed)
Blood pressure improved today and just slightly elevated above goal of 140/90. Continue current dosage of losartan and amlodipine. Encouraged to monitor blood pressure at home as able. Denies worst headache of life with no new symptoms of end organ damage.

## 2016-09-21 NOTE — Patient Instructions (Signed)
Thank you for choosing [facility].  SUMMARY AND INSTRUCTIONS:  Please continue to take your medications as prescribed.   Follow up with [facility] for your depression.  Your blood pressure is improved - monitor your blood pressure at home as able.   Follow up in 1 month or sooner.   Follow up:  If your symptoms worsen or fail to improve, please contact our office for further instruction, or in case of emergency go directly to the emergency room at the closest medical facility.

## 2016-09-21 NOTE — Assessment & Plan Note (Signed)
Depression remains labile with current medication regimen without suicidal ideation or psychotic features. Encouraged to follow up with [facility]. Continue current dosage of Zoloft, buproprion, and lorazepam. Follow up in 1 month or sooner if needed pending evaluation at [facility].

## 2016-09-21 NOTE — Progress Notes (Signed)
Subjective:    Patient ID: [name], female    DOB: 04/16/1960, 56 y.o.   MRN: [id number]  Chief Complaint  Patient presents with  . Follow-up    HPI:  [name] is a 56 y.o. female who  has a past medical history of Depression; Diabetes mellitus without complication ([place]); Diverticulitis; Eating disorder; Frequent headaches; GERD (gastroesophageal reflux disease); History of fainting spells of unknown cause; Hyperlipidemia; Hypertension; PTSD (post-traumatic stress disorder); and Renal disorder. and presents today for a follow up office visit.  1.) Hypertension - Currently maintained on amlodipine and losartan andreports taking the medications as prescribed and denies adverse side effects or hypotensive readings. Not currently checking blood pressures at home. Denies changes in vision, worst headache of life or new symptoms of end organ damage. Working on following a low sodium diet. Physical activity level   BP Readings from Last 3 Encounters:  09/21/16 (!) 142/80  09/07/16 (!) 160/88  03/06/16 106/57    2.) Depression - Currently maintained on Wellbutrin and Zoloft. Reports taking the medication as prescribed and notes that she does stay nauseated at times. Was not able to see [facility] secondary to financial restrictions and is planning to go over and speak with them. Mood has been the same and remains unchanged. No hallucinations or psychotic features. No thoughts of harming herself currently.    Allergies  Allergen Reactions  . Asa [Aspirin] Other (See Comments)    Stomach burns   . Morphine And Related Itching  . Oxycodone     "it makes me feel crazy"      Outpatient Medications Prior to Visit  Medication Sig Dispense Refill  . albuterol (PROVENTIL HFA;VENTOLIN HFA) 108 (90 Base) MCG/ACT inhaler Inhale 2 puffs into the lungs every 6 (six) hours as needed for wheezing or shortness of breath. 1 Inhaler 0  . amLODipine (NORVASC) 10 MG tablet Take 1 tablet  (10 mg total) by mouth daily. 90 tablet 0  . atorvastatin (LIPITOR) 20 MG tablet Take 1 tablet (20 mg total) by mouth daily. 90 tablet 0  . buPROPion (WELLBUTRIN) 75 MG tablet Take 1 tablet (75 mg total) by mouth 2 (two) times daily. 60 tablet 0  . Cholecalciferol (VITAMIN D3) 50000 units TABS Take 1 tablet by mouth once a week. 12 tablet 0  . LORazepam (ATIVAN) 0.5 MG tablet Take 1 tablet (0.5 mg total) by mouth daily as needed for anxiety. 30 tablet 0  . losartan (COZAAR) 25 MG tablet Take 1 tablet (25 mg total) by mouth daily. 90 tablet 0  . metFORMIN (GLUCOPHAGE) 500 MG tablet Take 1 tablet (500 mg total) by mouth 2 (two) times daily with a meal. 60 tablet 0  . pantoprazole (PROTONIX) 40 MG tablet Take 1 tablet (40 mg total) by mouth daily. 90 tablet 0  . polyethylene glycol powder (GLYCOLAX/MIRALAX) powder Take 17 g by mouth 2 (two) times daily. 255 g 0  . pregabalin (LYRICA) 75 MG capsule Take 2 capsules (150 mg total) by mouth 2 (two) times daily. 120 capsule 0  . sennosides-docusate sodium (SENOKOT-S) 8.6-50 MG tablet Take 1 tablet by mouth daily.    . sertraline (ZOLOFT) 100 MG tablet Take 1 tablet (100 mg total) by mouth daily. 30 tablet 1  . tiZANidine (ZANAFLEX) 4 MG tablet Take 1 tablet (4 mg total) by mouth 2 (two) times daily as needed for muscle spasms. 60 tablet 0  . traMADol (ULTRAM) 50 MG tablet Take 1 tablet (50 mg total) by  mouth every 8 (eight) hours as needed. 90 tablet 0  . zolpidem (AMBIEN) 10 MG tablet Take 1 tablet (10 mg total) by mouth at bedtime as needed for sleep. 30 tablet 0   No facility-administered medications prior to visit.      Past Medical History:  Diagnosis Date  . Depression   . Diabetes mellitus without complication ([place])   . Diverticulitis   . Eating disorder   . Frequent headaches   . GERD (gastroesophageal reflux disease)   . History of fainting spells of unknown cause   . Hyperlipidemia   . Hypertension   . PTSD (post-traumatic stress  disorder)   . Renal disorder      Review of Systems  Constitutional: Negative for chills and fever.  Eyes:       Negative for changes in vision  Respiratory: Negative for cough, chest tightness and wheezing.   Cardiovascular: Negative for chest pain, palpitations and leg swelling.  Neurological: Negative for dizziness, weakness and light-headedness.  Psychiatric/Behavioral: Positive for dysphoric mood. Negative for confusion, hallucinations, self-injury and suicidal ideas. The patient is not nervous/anxious.       Objective:    BP (!) 142/80 (BP Location: Left Arm, Patient Position: Sitting, Cuff Size: Normal)   Pulse (!) 50   Temp 98.4 F (36.9 C) (Oral)   Resp 16   Ht 5\' 5"  (1.651 m)   Wt 127 lb 12.8 oz (58 kg)   SpO2 96%   BMI 21.27 kg/m  Nursing note and vital signs reviewed.  Physical Exam  Constitutional: She is oriented to person, place, and time. She appears well-developed and well-nourished. No distress.  Cardiovascular: Normal rate, regular rhythm and intact distal pulses.  Exam reveals no gallop and no friction rub.   No murmur heard. Pulmonary/Chest: Effort normal and breath sounds normal. No respiratory distress. She has no wheezes. She has no rales. She exhibits no tenderness.  Neurological: She is alert and oriented to person, place, and time.  Skin: Skin is warm and dry.  Psychiatric: Her behavior is normal. Judgment and thought content normal. She exhibits a depressed mood.       Assessment & Plan:   Problem List Items Addressed This Visit      Cardiovascular and Mediastinum   Essential hypertension    Blood pressure improved today and just slightly elevated above goal of 140/90. Continue current dosage of losartan and amlodipine. Encouraged to monitor blood pressure at home as able. Denies worst headache of life with no new symptoms of end organ damage.         Other   Severe major depression ([place]) - Primary    Depression remains labile with  current medication regimen without suicidal ideation or psychotic features. Encouraged to follow up with [facility]. Continue current dosage of Zoloft, buproprion, and lorazepam. Follow up in 1 month or sooner if needed pending evaluation at [facility].           I am having Ms. Cargle maintain her sennosides-docusate sodium, albuterol, polyethylene glycol powder, traMADol, zolpidem, tiZANidine, amLODipine, atorvastatin, buPROPion, LORazepam, losartan, metFORMIN, pantoprazole, pregabalin, sertraline, and Vitamin D3.   Follow-up: Return in about 1 month (around 10/22/2016), or if symptoms worsen or fail to improve.  Mauricio Po, FNP

## 2016-09-22 ENCOUNTER — Telehealth: Payer: Self-pay | Admitting: Family

## 2016-09-22 NOTE — Telephone Encounter (Signed)
ROI faxed to [place]

## 2016-10-05 ENCOUNTER — Encounter: Payer: Self-pay | Admitting: Family

## 2016-10-09 NOTE — Telephone Encounter (Signed)
Rec'd from [place] forwarded 45 pages to [place]

## 2016-10-22 ENCOUNTER — Ambulatory Visit: Admitting: Family

## 2016-11-12 ENCOUNTER — Encounter: Payer: Self-pay | Admitting: Family

## 2016-11-12 ENCOUNTER — Ambulatory Visit (INDEPENDENT_AMBULATORY_CARE_PROVIDER_SITE_OTHER): Admitting: Family

## 2016-11-12 VITALS — BP 128/82 | HR 106 | Temp 99.4°F | Resp 16 | Ht 65.0 in | Wt 122.0 lb

## 2016-11-12 DIAGNOSIS — F322 Major depressive disorder, single episode, severe without psychotic features: Secondary | ICD-10-CM

## 2016-11-12 DIAGNOSIS — E860 Dehydration: Secondary | ICD-10-CM | POA: Diagnosis not present

## 2016-11-12 MED ORDER — METFORMIN HCL 500 MG PO TABS: 500.0000 mg | ORAL_TABLET | Freq: Two times a day (BID) | ORAL | 0 refills | Status: DC

## 2016-11-12 MED ORDER — SERTRALINE HCL 100 MG PO TABS: 100.0000 mg | ORAL_TABLET | Freq: Every day | ORAL | 1 refills | Status: DC

## 2016-11-12 MED ORDER — ONDANSETRON 4 MG PO TBDP: 4.0000 mg | ORAL_TABLET | Freq: Three times a day (TID) | ORAL | 0 refills | Status: DC | PRN

## 2016-11-12 MED ORDER — LORAZEPAM 0.5 MG PO TABS: 0.5000 mg | ORAL_TABLET | Freq: Every day | ORAL | 2 refills | Status: DC | PRN

## 2016-11-12 MED ORDER — BUPROPION HCL 75 MG PO TABS: 75.0000 mg | ORAL_TABLET | Freq: Two times a day (BID) | ORAL | 0 refills | Status: DC

## 2016-11-12 MED ORDER — ZOLPIDEM TARTRATE 10 MG PO TABS
10.0000 mg | ORAL_TABLET | Freq: Every evening | ORAL | 2 refills | Status: DC | PRN
Start: 2016-11-12 — End: 2017-01-30

## 2016-11-12 MED ORDER — AMLODIPINE BESYLATE 10 MG PO TABS: 10.0000 mg | ORAL_TABLET | Freq: Every day | ORAL | 0 refills | Status: DC

## 2016-11-12 MED ORDER — ATORVASTATIN CALCIUM 20 MG PO TABS: 20.0000 mg | ORAL_TABLET | Freq: Every day | ORAL | 0 refills | Status: DC

## 2016-11-12 MED ORDER — MELOXICAM 15 MG PO TABS: 15.0000 mg | ORAL_TABLET | Freq: Every day | ORAL | 2 refills | Status: DC

## 2016-11-12 MED ORDER — PANTOPRAZOLE SODIUM 40 MG PO TBEC: 40.0000 mg | DELAYED_RELEASE_TABLET | Freq: Every day | ORAL | 0 refills | Status: DC

## 2016-11-12 MED ORDER — PREGABALIN 75 MG PO CAPS: 150.0000 mg | ORAL_CAPSULE | Freq: Two times a day (BID) | ORAL | 2 refills | Status: DC

## 2016-11-12 MED ORDER — LOSARTAN POTASSIUM 25 MG PO TABS: 25.0000 mg | ORAL_TABLET | Freq: Every day | ORAL | 0 refills | Status: DC

## 2016-11-12 NOTE — Assessment & Plan Note (Signed)
Symptoms and exam consistent with dehydration resulting in dizziness. Encouraged decreasing caffeine intake to 1-2 cups daily. Increase water intake. Encouraged to change position slowly and follow-up if symptoms worsen or do not improve.

## 2016-11-12 NOTE — Assessment & Plan Note (Signed)
Depression appears improved with current medication regimen when she has her medications. Refill sertraline and Wellbutrin. Denies suicidal ideations with no symptoms of psychosis. Follow-up with psychiatry as scheduled.

## 2016-11-12 NOTE — Patient Instructions (Addendum)
Thank you for choosing [facility].  SUMMARY AND INSTRUCTIONS:  Please continue to take your medicaitons as prescribed.  We will restart your ondansetron.  Start the Meloxicam and stop the Tramadol.   Follow up with [facility] as scheduled.   Medication:  Your prescription(s) have been submitted to your pharmacy or been printed and provided for you. Please take as directed and contact our office if you believe you are having problem(s) with the medication(s) or have any questions.  Follow up:  If your symptoms worsen or fail to improve, please contact our office for further instruction, or in case of emergency go directly to the emergency room at the closest medical facility.

## 2016-11-12 NOTE — Progress Notes (Signed)
Subjective:    Patient ID: [name], female    DOB: [birth year], 56 y.o.   MRN: [id number]  Chief Complaint  Patient presents with  . Follow-up    Drs note for school from 7/23-9/10 for depression, dizziness,     HPI:  [name] is a 56 y.o. female who  has a past medical history of Depression; Diabetes mellitus without complication ([place]); Diverticulitis; Eating disorder; Frequent headaches; GERD (gastroesophageal reflux disease); History of fainting spells of unknown cause; Hyperlipidemia; Hypertension; PTSD (post-traumatic stress disorder); and Renal disorder. and presents today for a follow up office visit.  1.) Depression - Currently prescribed sertraline and Wellbutrin. Reports she has been out of the medication for several days. Symptoms have improved with medication regimen as well as current family situation. Indicates she removed certain family members from her household reducing her stress significantly. Denies suicidal ideations. Indicates she has an appointment upcoming with psychiatry. Continues to have some difficulties with sleeping secondary to being out of her Ambien and overall body pains.  2.)  Dizziness - This is a new problem. Associated symptom of dizziness that has been going on for about 1 day. Severity is enough that she had a fall yesterday falling to the floor. Reports that she does not have anything broken, however she does have pain located in her hip. Reports that she is not drinking a lot of water.    Allergies  Allergen Reactions  . Asa [Aspirin] Other (See Comments)    Stomach burns   . Morphine And Related Itching  . Oxycodone     "it makes me feel crazy"      Outpatient Medications Prior to Visit  Medication Sig Dispense Refill  . albuterol (PROVENTIL HFA;VENTOLIN HFA) 108 (90 Base) MCG/ACT inhaler Inhale 2 puffs into the lungs every 6 (six) hours as needed for wheezing or shortness of breath. 1 Inhaler 0  . Cholecalciferol  (VITAMIN D3) 50000 units TABS Take 1 tablet by mouth once a week. 12 tablet 0  . polyethylene glycol powder (GLYCOLAX/MIRALAX) powder Take 17 g by mouth 2 (two) times daily. 255 g 0  . sennosides-docusate sodium (SENOKOT-S) 8.6-50 MG tablet Take 1 tablet by mouth daily.    Marland Kitchen amLODipine (NORVASC) 10 MG tablet Take 1 tablet (10 mg total) by mouth daily. 90 tablet 0  . atorvastatin (LIPITOR) 20 MG tablet Take 1 tablet (20 mg total) by mouth daily. 90 tablet 0  . buPROPion (WELLBUTRIN) 75 MG tablet Take 1 tablet (75 mg total) by mouth 2 (two) times daily. 60 tablet 0  . LORazepam (ATIVAN) 0.5 MG tablet Take 1 tablet (0.5 mg total) by mouth daily as needed for anxiety. 30 tablet 0  . losartan (COZAAR) 25 MG tablet Take 1 tablet (25 mg total) by mouth daily. 90 tablet 0  . metFORMIN (GLUCOPHAGE) 500 MG tablet Take 1 tablet (500 mg total) by mouth 2 (two) times daily with a meal. 60 tablet 0  . pantoprazole (PROTONIX) 40 MG tablet Take 1 tablet (40 mg total) by mouth daily. 90 tablet 0  . pregabalin (LYRICA) 75 MG capsule Take 2 capsules (150 mg total) by mouth 2 (two) times daily. 120 capsule 0  . sertraline (ZOLOFT) 100 MG tablet Take 1 tablet (100 mg total) by mouth daily. 30 tablet 1  . tiZANidine (ZANAFLEX) 4 MG tablet Take 1 tablet (4 mg total) by mouth 2 (two) times daily as needed for muscle spasms. 60 tablet 0  . traMADol (ULTRAM) 50  MG tablet Take 1 tablet (50 mg total) by mouth every 8 (eight) hours as needed. 90 tablet 0  . zolpidem (AMBIEN) 10 MG tablet Take 1 tablet (10 mg total) by mouth at bedtime as needed for sleep. 30 tablet 0   No facility-administered medications prior to visit.       Past Surgical History:  Procedure Laterality Date  . BUNIONECTOMY    . c-section     . CESAREAN SECTION    . URETHROPLASTY        Past Medical History:  Diagnosis Date  . Depression   . Diabetes mellitus without complication ([place])   . Diverticulitis   . Eating disorder   . Frequent  headaches   . GERD (gastroesophageal reflux disease)   . History of fainting spells of unknown cause   . Hyperlipidemia   . Hypertension   . PTSD (post-traumatic stress disorder)   . Renal disorder       Review of Systems  Constitutional: Negative for chills, fatigue and fever.  Respiratory: Negative for chest tightness and shortness of breath.   Cardiovascular: Negative for chest pain, palpitations and leg swelling.  Gastrointestinal: Positive for nausea.  Musculoskeletal: Positive for arthralgias and myalgias.  Neurological: Positive for dizziness. Negative for tremors, seizures, syncope, facial asymmetry, speech difficulty, weakness, light-headedness, numbness and headaches.      Objective:    BP 128/82 (BP Location: Left Arm, Patient Position: Sitting, Cuff Size: Normal)   Pulse (!) 106   Temp 99.4 F (37.4 C) (Oral)   Resp 16   Ht 5\' 5"  (1.651 m)   Wt 122 lb (55.3 kg)   SpO2 98%   BMI 20.30 kg/m  Nursing note and vital signs reviewed.  Physical Exam  Constitutional: She is oriented to person, place, and time. She appears well-developed and well-nourished. No distress.  Eyes: Pupils are equal, round, and reactive to light. Conjunctivae and EOM are normal.  Neck: Neck supple.  Cardiovascular: Normal rate, regular rhythm, normal heart sounds and intact distal pulses.  Exam reveals no gallop and no friction rub.   No murmur heard. Pulmonary/Chest: Effort normal and breath sounds normal. No respiratory distress. She has no wheezes. She has no rales. She exhibits no tenderness.  Lymphadenopathy:    She has no cervical adenopathy.  Neurological: She is alert and oriented to person, place, and time. No cranial nerve deficit.  Skin: Skin is warm and dry.  Psychiatric: She has a normal mood and affect. Her behavior is normal. Judgment and thought content normal.       Assessment & Plan:   Problem List Items Addressed This Visit      Other   Severe major depression  ([place]) - Primary    Depression appears improved with current medication regimen when she has her medications. Refill sertraline and Wellbutrin. Denies suicidal ideations with no symptoms of psychosis. Follow-up with psychiatry as scheduled.      Relevant Medications   sertraline (ZOLOFT) 100 MG tablet   LORazepam (ATIVAN) 0.5 MG tablet   buPROPion (WELLBUTRIN) 75 MG tablet   Dehydration    Symptoms and exam consistent with dehydration resulting in dizziness. Encouraged decreasing caffeine intake to 1-2 cups daily. Increase water intake. Encouraged to change position slowly and follow-up if symptoms worsen or do not improve.          I have discontinued Ms. Channing's traMADol and tiZANidine. I am also having her start on meloxicam and ondansetron. Additionally, I am having her  maintain her sennosides-docusate sodium, albuterol, polyethylene glycol powder, Vitamin D3, pregabalin, sertraline, zolpidem, LORazepam, losartan, metFORMIN, pantoprazole, buPROPion, atorvastatin, and amLODipine.   Meds ordered this encounter  Medications  . meloxicam (MOBIC) 15 MG tablet    Sig: Take 1 tablet (15 mg total) by mouth daily.    Dispense:  30 tablet    Refill:  2    Order Specific Question:   Supervising Provider    Answer:   Pricilla Holm A [[id number]]  . pregabalin (LYRICA) 75 MG capsule    Sig: Take 2 capsules (150 mg total) by mouth 2 (two) times daily.    Dispense:  60 capsule    Refill:  2    Order Specific Question:   Supervising Provider    Answer:   Pricilla Holm A [[id number]]  . sertraline (ZOLOFT) 100 MG tablet    Sig: Take 1 tablet (100 mg total) by mouth daily.    Dispense:  30 tablet    Refill:  1    Order Specific Question:   Supervising Provider    Answer:   Pricilla Holm A [[id number]]  . zolpidem (AMBIEN) 10 MG tablet    Sig: Take 1 tablet (10 mg total) by mouth at bedtime as needed for sleep.    Dispense:  30 tablet    Refill:  2    Order Specific Question:    Supervising Provider    Answer:   Pricilla Holm A [[id number]]  . LORazepam (ATIVAN) 0.5 MG tablet    Sig: Take 1 tablet (0.5 mg total) by mouth daily as needed for anxiety.    Dispense:  30 tablet    Refill:  2    Order Specific Question:   Supervising Provider    Answer:   Pricilla Holm A [[id number]]  . losartan (COZAAR) 25 MG tablet    Sig: Take 1 tablet (25 mg total) by mouth daily.    Dispense:  90 tablet    Refill:  0    Order Specific Question:   Supervising Provider    Answer:   Pricilla Holm A [[id number]]  . metFORMIN (GLUCOPHAGE) 500 MG tablet    Sig: Take 1 tablet (500 mg total) by mouth 2 (two) times daily with a meal.    Dispense:  180 tablet    Refill:  0    Order Specific Question:   Supervising Provider    Answer:   Pricilla Holm A [[id number]]  . pantoprazole (PROTONIX) 40 MG tablet    Sig: Take 1 tablet (40 mg total) by mouth daily.    Dispense:  90 tablet    Refill:  0    Order Specific Question:   Supervising Provider    Answer:   Pricilla Holm A [[id number]]  . buPROPion (WELLBUTRIN) 75 MG tablet    Sig: Take 1 tablet (75 mg total) by mouth 2 (two) times daily.    Dispense:  180 tablet    Refill:  0    Order Specific Question:   Supervising Provider    Answer:   Pricilla Holm A [[id number]]  . atorvastatin (LIPITOR) 20 MG tablet    Sig: Take 1 tablet (20 mg total) by mouth daily.    Dispense:  90 tablet    Refill:  0    Order Specific Question:   Supervising Provider    Answer:   Pricilla Holm A [[id number]]  . amLODipine (NORVASC) 10 MG tablet    Sig: Take 1 tablet (10 mg total) by mouth  daily.    Dispense:  90 tablet    Refill:  0    Order Specific Question:   Supervising Provider    Answer:   Pricilla Holm A [[id number]]  . ondansetron (ZOFRAN-ODT) 4 MG disintegrating tablet    Sig: Take 1 tablet (4 mg total) by mouth every 8 (eight) hours as needed for nausea or vomiting.    Dispense:  90 tablet    Refill:  0    Order Specific Question:    Supervising Provider    Answer:   Pricilla Holm A [[id number]]     Follow-up: Return in about 3 months (around 02/11/2017), or if symptoms worsen or fail to improve.  Mauricio Po, FNP

## 2016-12-31 ENCOUNTER — Encounter: Admitting: Nurse Practitioner

## 2017-01-21 ENCOUNTER — Emergency Department (HOSPITAL_COMMUNITY)
Admission: EM | Admit: 2017-01-21 | Discharge: 2017-01-21 | Disposition: A | Discharge status: Home / Self Care | Attending: Emergency Medicine | Admitting: Emergency Medicine

## 2017-01-21 ENCOUNTER — Encounter (HOSPITAL_COMMUNITY): Payer: Self-pay | Admitting: *Deleted

## 2017-01-21 ENCOUNTER — Other Ambulatory Visit: Payer: Self-pay

## 2017-01-21 ENCOUNTER — Emergency Department (HOSPITAL_COMMUNITY)

## 2017-01-21 DIAGNOSIS — E119 Type 2 diabetes mellitus without complications: Secondary | ICD-10-CM | POA: Insufficient documentation

## 2017-01-21 DIAGNOSIS — I10 Essential (primary) hypertension: Secondary | ICD-10-CM | POA: Diagnosis not present

## 2017-01-21 DIAGNOSIS — E785 Hyperlipidemia, unspecified: Secondary | ICD-10-CM | POA: Diagnosis not present

## 2017-01-21 DIAGNOSIS — M25552 Pain in left hip: Secondary | ICD-10-CM

## 2017-01-21 DIAGNOSIS — F17210 Nicotine dependence, cigarettes, uncomplicated: Secondary | ICD-10-CM | POA: Insufficient documentation

## 2017-01-21 DIAGNOSIS — J449 Chronic obstructive pulmonary disease, unspecified: Secondary | ICD-10-CM | POA: Insufficient documentation

## 2017-01-21 DIAGNOSIS — Z79899 Other long term (current) drug therapy: Secondary | ICD-10-CM | POA: Diagnosis not present

## 2017-01-21 DIAGNOSIS — Z7984 Long term (current) use of oral hypoglycemic drugs: Secondary | ICD-10-CM | POA: Insufficient documentation

## 2017-01-21 MED ORDER — OXYCODONE-ACETAMINOPHEN 5-325 MG PO TABS: 1.0000 | ORAL_TABLET | Freq: Four times a day (QID) | ORAL | 0 refills | Status: DC | PRN

## 2017-01-21 MED ORDER — OXYCODONE-ACETAMINOPHEN 5-325 MG PO TABS
1.0000 | ORAL_TABLET | Freq: Once | ORAL | Status: AC
  Administered 2017-01-21: 1 via ORAL
  Filled 2017-01-21: qty 1

## 2017-01-21 NOTE — ED Provider Notes (Signed)
[facility] EMERGENCY DEPARTMENT Provider Note   CSN: [id number] Arrival date & time: 01/21/17  1037     History   Chief Complaint Chief Complaint  Patient presents with  . Hip Pain    HPI [name] is a 56 y.o. female.  HPI   56 year old female presents today with complaints of left hip pain.  Patient notes a 2-week history of slowly worsening left hip pain.  Patient notes that it has gotten to the point where she has difficulty ambulating and has a sharp burning sensation in the hip.  She reports pain is worse with laying on the left side, also worse with range of motion of the hip.  Patient reports subjective fevers intermittently, none presently.  Patient reports that she followed up with orthopedic Dr. Herma Mering who ordered an MRI.  Patient was unable to provide any significant information surrounding the MRI what was imaged or where this was done at. (Spoke with Dr. Dossie Der he reports MRI was lumbar spine had ruptured disc between L2 and L3 no other acute findings) patient notes she was placed on prednisone at that time which has not improved her symptoms.  Patient notes she takes Tylenol with very minimal improvement in her symptoms.  She denies any significant distal neurological deficits, has a vague sensation of decreased sensation along the left lateral ankle.    Past Medical History:  Diagnosis Date  . Depression   . Diabetes mellitus without complication ([place])   . Diverticulitis   . Eating disorder   . Frequent headaches   . GERD (gastroesophageal reflux disease)   . History of fainting spells of unknown cause   . Hyperlipidemia   . Hypertension   . PTSD (post-traumatic stress disorder)   . Renal disorder     Patient Active Problem List   Diagnosis Date Noted  . Dehydration 11/12/2016  . Chronic pain due to trauma 09/07/2016  . Type 2 diabetes mellitus ([place]) 09/07/2016  . Severe major depression ([place]) 09/07/2016  . PTSD (post-traumatic  stress disorder) 09/07/2016  . COPD exacerbation ([place]) 02/28/2016  . Tobacco abuse 02/28/2016  . GERD (gastroesophageal reflux disease) 02/28/2016  . Essential hypertension 02/28/2016  . Acute bronchitis 02/28/2016    Past Surgical History:  Procedure Laterality Date  . BUNIONECTOMY    . c-section     . CESAREAN SECTION    . URETHROPLASTY      OB History    No data available       Home Medications    Prior to Admission medications   Medication Sig Start Date End Date Taking? Authorizing Provider  albuterol (PROVENTIL HFA;VENTOLIN HFA) 108 (90 Base) MCG/ACT inhaler Inhale 2 puffs into the lungs every 6 (six) hours as needed for wheezing or shortness of breath. 03/02/16   Hongalgi, Lenis Dickinson, MD  amLODipine (NORVASC) 10 MG tablet Take 1 tablet (10 mg total) by mouth daily. 11/12/16   Golden Circle, FNP  atorvastatin (LIPITOR) 20 MG tablet Take 1 tablet (20 mg total) by mouth daily. 11/12/16   Golden Circle, FNP  buPROPion (WELLBUTRIN) 75 MG tablet Take 1 tablet (75 mg total) by mouth 2 (two) times daily. 11/12/16   Golden Circle, FNP  Cholecalciferol (VITAMIN D3) 50000 units TABS Take 1 tablet by mouth once a week. 09/10/16   Golden Circle, FNP  LORazepam (ATIVAN) 0.5 MG tablet Take 1 tablet (0.5 mg total) by mouth daily as needed for anxiety. 11/12/16   Mauricio Po  D, FNP  losartan (COZAAR) 25 MG tablet Take 1 tablet (25 mg total) by mouth daily. 11/12/16   Golden Circle, FNP  meloxicam (MOBIC) 15 MG tablet Take 1 tablet (15 mg total) by mouth daily. 11/12/16   Golden Circle, FNP  metFORMIN (GLUCOPHAGE) 500 MG tablet Take 1 tablet (500 mg total) by mouth 2 (two) times daily with a meal. 11/12/16   Calone, Ples Specter, FNP  ondansetron (ZOFRAN-ODT) 4 MG disintegrating tablet Take 1 tablet (4 mg total) by mouth every 8 (eight) hours as needed for nausea or vomiting. 11/12/16   Golden Circle, FNP  oxyCODONE-acetaminophen (PERCOCET/ROXICET) 5-325 MG tablet Take 1  tablet by mouth every 6 (six) hours as needed for severe pain. 01/21/17   Lasasha Brophy, Dellis Filbert, PA-C  pantoprazole (PROTONIX) 40 MG tablet Take 1 tablet (40 mg total) by mouth daily. 11/12/16   Golden Circle, FNP  polyethylene glycol powder (GLYCOLAX/MIRALAX) powder Take 17 g by mouth 2 (two) times daily. 03/06/16   Muthersbaugh, Jarrett Soho, PA-C  pregabalin (LYRICA) 75 MG capsule Take 2 capsules (150 mg total) by mouth 2 (two) times daily. 11/12/16   Golden Circle, FNP  sennosides-docusate sodium (SENOKOT-S) 8.6-50 MG tablet Take 1 tablet by mouth daily.    [provider]  sertraline (ZOLOFT) 100 MG tablet Take 1 tablet (100 mg total) by mouth daily. 11/12/16   Golden Circle, FNP  zolpidem (AMBIEN) 10 MG tablet Take 1 tablet (10 mg total) by mouth at bedtime as needed for sleep. 11/12/16 12/12/16  Golden Circle, FNP    Family History Family History  Problem Relation Age of Onset  . Depression Mother   . Diabetes Mother   . Hypertension Mother   . Hyperlipidemia Mother   . Depression Maternal Grandmother     Social History Social History   Tobacco Use  . Smoking status: Current Every Day Smoker    Packs/day: 1.00    Years: 35.00    Pack years: 35.00    Types: Cigarettes  . Smokeless tobacco: Never Used  Substance Use Topics  . Alcohol use: No  . Drug use: No     Allergies   Asa [aspirin]; Morphine and related; and Oxycodone   Review of Systems Review of Systems  All other systems reviewed and are negative.   Physical Exam Updated Vital Signs BP (!) 193/70 (BP Location: Right Arm)   Pulse 78   Temp 98.3 F (36.8 C) (Oral)   Resp 16   SpO2 98%   Physical Exam  Constitutional: She is oriented to person, place, and time. She appears well-developed and well-nourished.  HENT:  Head: Normocephalic and atraumatic.  Eyes: Conjunctivae are normal. Pupils are equal, round, and reactive to light. Right eye exhibits no discharge. Left eye exhibits no discharge.  No scleral icterus.  Neck: Normal range of motion. No JVD present. No tracheal deviation present.  Pulmonary/Chest: Effort normal. No stridor.  Musculoskeletal:  Left hip atraumatic and without swelling or edema, no redness, full active range of motion pain with flexion and extension, pain with internal and external rotation of the hip.  Distal sensation strength and motor function is intact.-Minor tenderness to palpation of the lower lumbar and posterior hip, no redness or swelling.  Neurological: She is alert and oriented to person, place, and time. Coordination normal.  Psychiatric: She has a normal mood and affect. Her behavior is normal. Judgment and thought content normal.  Nursing note and vitals reviewed.  ED Treatments / Results  Labs (all labs ordered are listed, but only abnormal results are displayed) Labs Reviewed - No data to display  EKG  EKG Interpretation None       Radiology Dg Hip Unilat W Or Wo Pelvis 2-3 Views Left  Result Date: 01/21/2017 CLINICAL DATA:  Left buttock and hip pain, 2 weeks duration. No known injury. EXAM: DG HIP (WITH OR WITHOUT PELVIS) 2-3V LEFT COMPARISON:  None. FINDINGS: Both hip joints appear normal. No joint space narrowing. Mild sacroiliac osteoarthritis. No other bone or joint finding in the pelvis or hips. IMPRESSION: Negative appearance of the left hip. 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 Electronically Signed   By: Nelson Chimes M.D.   On: 01/21/2017 13:43    Procedures Procedures (including critical care time)  Medications Ordered in ED Medications  oxyCODONE-acetaminophen (PERCOCET/ROXICET) 5-325 MG per tablet 1 tablet (1 tablet Oral Given 01/21/17 1326)     Initial Impression / Assessment and Plan / ED Course  I have reviewed the triage vital signs and the nursing notes.  Pertinent labs & imaging results that were available during my care of the patient were reviewed by me and considered in my medical decision  making (see chart for details).      Final Clinical Impressions(s) / ED Diagnoses   Final diagnoses:  Left hip pain    56 year old female presents today with complaints of hip pain.  Patient extremely uncomfortable in exam bed.  She is tearful.  I spoke with Dr. Jeanell Sparrow most as she had seen him for pain management.  Imaging shows ruptured disc in the lower lumbar region.  No other acute findings.  Plain films here negative.  I have very low suspicion for infectious etiology, she has no significant neurological deficits.  Patient will be given a short course of pain medication she is encouraged to follow-up as an outpatient.  Dr. Norville Haggard reports that he put in a consult for Dr. Maxie Better.  Patient will be discharged with strict return precautions follow-up information.  She verbalized understanding and agreement to this plan had no further questions or concerns.  ED Discharge Orders        Ordered    oxyCODONE-acetaminophen (PERCOCET/ROXICET) 5-325 MG tablet  Every 6 hours PRN     01/21/17 1452       Okey Regal, PA-C 01/21/17 1644    Little, Wenda Overland, MD 01/21/17 2035

## 2017-01-21 NOTE — ED Notes (Addendum)
Error, noted entered on wrong pt, please disregard

## 2017-01-21 NOTE — Discharge Instructions (Signed)
Please read attached information. If you experience any new or worsening signs or symptoms please return to the emergency room for evaluation. Please follow-up with your primary care provider or specialist as discussed. Please use medication prescribed only as directed and discontinue taking if you have any concerning signs or symptoms.   °

## 2017-01-21 NOTE — ED Triage Notes (Addendum)
Pt reports that she has left hip pain. Pt reports ongoing for 2 weeks unsure if she injured it.. Pt states that she had an MRI completed on her hip yesterday that was ordered by her ortho MD. Pt has been taking prednisone for the pain. Pt reports some decrease in urination but denies burning/pain

## 2017-01-29 ENCOUNTER — Ambulatory Visit: Payer: Self-pay | Admitting: Orthopedic Surgery

## 2017-01-30 ENCOUNTER — Ambulatory Visit (INDEPENDENT_AMBULATORY_CARE_PROVIDER_SITE_OTHER): Admitting: Internal Medicine

## 2017-01-30 ENCOUNTER — Encounter: Payer: Self-pay | Admitting: Internal Medicine

## 2017-01-30 VITALS — BP 154/88 | HR 75 | Temp 98.4°F | Ht 65.0 in | Wt 125.0 lb

## 2017-01-30 DIAGNOSIS — E119 Type 2 diabetes mellitus without complications: Secondary | ICD-10-CM | POA: Diagnosis not present

## 2017-01-30 DIAGNOSIS — I10 Essential (primary) hypertension: Secondary | ICD-10-CM

## 2017-01-30 DIAGNOSIS — Z23 Encounter for immunization: Secondary | ICD-10-CM | POA: Diagnosis not present

## 2017-01-30 DIAGNOSIS — Z1159 Encounter for screening for other viral diseases: Secondary | ICD-10-CM

## 2017-01-30 DIAGNOSIS — Z01818 Encounter for other preprocedural examination: Secondary | ICD-10-CM

## 2017-01-30 DIAGNOSIS — E559 Vitamin D deficiency, unspecified: Secondary | ICD-10-CM

## 2017-01-30 DIAGNOSIS — Z0001 Encounter for general adult medical examination with abnormal findings: Secondary | ICD-10-CM | POA: Diagnosis not present

## 2017-01-30 DIAGNOSIS — Z1231 Encounter for screening mammogram for malignant neoplasm of breast: Secondary | ICD-10-CM

## 2017-01-30 DIAGNOSIS — M5416 Radiculopathy, lumbar region: Secondary | ICD-10-CM

## 2017-01-30 DIAGNOSIS — E785 Hyperlipidemia, unspecified: Secondary | ICD-10-CM | POA: Insufficient documentation

## 2017-01-30 DIAGNOSIS — Z114 Encounter for screening for human immunodeficiency virus [HIV]: Secondary | ICD-10-CM

## 2017-01-30 DIAGNOSIS — Z0000 Encounter for general adult medical examination without abnormal findings: Secondary | ICD-10-CM

## 2017-01-30 HISTORY — DX: Encounter for general adult medical examination without abnormal findings: Z00.00

## 2017-01-30 HISTORY — DX: Vitamin D deficiency, unspecified: E55.9

## 2017-01-30 MED ORDER — ZOLPIDEM TARTRATE 10 MG PO TABS: 10.0000 mg | ORAL_TABLET | Freq: Every evening | ORAL | 1 refills | Status: DC | PRN

## 2017-01-30 MED ORDER — ATORVASTATIN CALCIUM 20 MG PO TABS: 20.0000 mg | ORAL_TABLET | Freq: Every day | ORAL | 3 refills | Status: DC

## 2017-01-30 MED ORDER — AMLODIPINE BESYLATE 10 MG PO TABS: 10.0000 mg | ORAL_TABLET | Freq: Every day | ORAL | 3 refills | Status: DC

## 2017-01-30 MED ORDER — PREGABALIN 75 MG PO CAPS: 150.0000 mg | ORAL_CAPSULE | Freq: Two times a day (BID) | ORAL | 1 refills | Status: DC

## 2017-01-30 MED ORDER — METFORMIN HCL 500 MG PO TABS: 500.0000 mg | ORAL_TABLET | Freq: Two times a day (BID) | ORAL | 3 refills | Status: DC

## 2017-01-30 MED ORDER — MELOXICAM 15 MG PO TABS: 15.0000 mg | ORAL_TABLET | Freq: Every day | ORAL | 3 refills | Status: DC

## 2017-01-30 MED ORDER — BUPROPION HCL 75 MG PO TABS: 75.0000 mg | ORAL_TABLET | Freq: Two times a day (BID) | ORAL | 3 refills | Status: DC

## 2017-01-30 MED ORDER — LORAZEPAM 0.5 MG PO TABS
0.5000 mg | ORAL_TABLET | Freq: Every day | ORAL | 1 refills | Status: DC | PRN
Start: 2017-01-30 — End: 2017-01-30

## 2017-01-30 MED ORDER — LORAZEPAM 0.5 MG PO TABS: 0.5000 mg | ORAL_TABLET | Freq: Every day | ORAL | 1 refills | Status: DC | PRN

## 2017-01-30 MED ORDER — SERTRALINE HCL 100 MG PO TABS: 100.0000 mg | ORAL_TABLET | Freq: Every day | ORAL | 3 refills | Status: DC

## 2017-01-30 MED ORDER — ALBUTEROL SULFATE HFA 108 (90 BASE) MCG/ACT IN AERS: 2.0000 | INHALATION_SPRAY | Freq: Four times a day (QID) | RESPIRATORY_TRACT | 3 refills | Status: DC | PRN

## 2017-01-30 MED ORDER — PANTOPRAZOLE SODIUM 40 MG PO TBEC: 40.0000 mg | DELAYED_RELEASE_TABLET | Freq: Every day | ORAL | 3 refills | Status: DC

## 2017-01-30 MED ORDER — LOSARTAN POTASSIUM 100 MG PO TABS: 100.0000 mg | ORAL_TABLET | Freq: Every day | ORAL | 3 refills | Status: DC

## 2017-01-30 MED ORDER — LORAZEPAM 0.5 MG PO TABS
0.5000 mg | ORAL_TABLET | Freq: Every day | ORAL | 1 refills | Status: DC | PRN
Start: 2017-01-30 — End: 2017-05-10

## 2017-01-30 MED ORDER — ZOLPIDEM TARTRATE 10 MG PO TABS
10.0000 mg | ORAL_TABLET | Freq: Every evening | ORAL | 1 refills | Status: DC | PRN
Start: 2017-01-30 — End: 2017-01-30

## 2017-01-30 NOTE — Progress Notes (Signed)
Subjective:    Patient ID: [name], female    DOB: 1960/12/14, 56 y.o.   MRN: [id number]  HPI  Here for wellness and surgical clearance  Overall doing ok except for persistent severe left lumbar radicular pain worse in recent weeks, for surgury soon.  Pt denies Chest pain, worsening SOB, DOE, wheezing, orthopnea, PND, worsening LE edema, palpitations, dizziness or syncope.  Pt denies other neurological change such as new headache, facial or extremity weakness.  Pt denies polydipsia, polyuria, or low sugar symptoms. Has not had recent a1c.   Pt states overall good compliance with treatment and medications, good tolerability, and has been trying to follow appropriate diet.  Pt denies worsening depressive symptoms, suicidal ideation or panic. No fever, night sweats, wt loss, loss of appetite, or other constitutional symptoms.  Pt states good ability with ADL's, has low fall risk, home safety reviewed and adequate, no other significant changes in hearing or vision, and not recently active with exercise due to pain.  Due for flu shot and prevnar, plans t call for eye doctor appt,  Due soon Dec 31 lumbar disc surgury with Dr Kendall Flack.  No other complaints or interval hx. Past Medical History:  Diagnosis Date  . Depression   . Diabetes mellitus without complication ([place])   . Diverticulitis   . Eating disorder   . Frequent headaches   . GERD (gastroesophageal reflux disease)   . History of fainting spells of unknown cause   . Hyperlipidemia   . Hypertension   . PTSD (post-traumatic stress disorder)   . Renal disorder   . Vitamin D deficiency 01/30/2017   Past Surgical History:  Procedure Laterality Date  . BUNIONECTOMY    . c-section     . CESAREAN SECTION    . URETHROPLASTY      reports that she has been smoking cigarettes.  She has a 35.00 pack-year smoking history. she has never used smokeless tobacco. She reports that she does not drink alcohol or use drugs. family history  includes Depression in her maternal grandmother and mother; Diabetes in her mother; Hyperlipidemia in her mother; Hypertension in her mother. Allergies  Allergen Reactions  . Asa [Aspirin] Other (See Comments)    Stomach burns   . Morphine And Related Itching  . Oxycodone     "it makes me feel crazy"   Review of Systems Constitutional: Negative for other unusual diaphoresis, sweats, appetite or weight changes HENT: Negative for other worsening hearing loss, ear pain, facial swelling, mouth sores or neck stiffness.   Eyes: Negative for other worsening pain, redness or other visual disturbance.  Respiratory: Negative for other stridor or swelling Cardiovascular: Negative for other palpitations or other chest pain  Gastrointestinal: Negative for worsening diarrhea or loose stools, blood in stool, distention or other pain Genitourinary: Negative for hematuria, flank pain or other change in urine volume.  Musculoskeletal: Negative for myalgias or other joint swelling.  Skin: Negative for other color change, or other wound or worsening drainage.  Neurological: Negative for other syncope or numbness. Hematological: Negative for other adenopathy or swelling Psychiatric/Behavioral: Negative for hallucinations, other worsening agitation, SI, self-injury, or new decreased concentration All other system neg per pt    Objective:   Physical Exam BP (!) 154/88 (BP Location: Left Arm, Patient Position: Sitting, Cuff Size: Normal)   Pulse 75   Temp 98.4 F (36.9 C) (Oral)   Ht 5\' 5"  (1.651 m)   Wt 125 lb (56.7 kg)   SpO2 100%  BMI 20.80 kg/m  VS noted,  Constitutional: Pt is oriented to person, place, and time. Appears well-developed and well-nourished, in no significant distress and comfortable Head: Normocephalic and atraumatic  Eyes: Conjunctivae and EOM are normal. Pupils are equal, round, and reactive to light Right Ear: External ear normal without discharge Left Ear: External ear normal  without discharge Nose: Nose without discharge or deformity Mouth/Throat: Oropharynx is without other ulcerations and moist  Neck: Normal range of motion. Neck supple. No JVD present. No tracheal deviation present or significant neck LA or mass Cardiovascular: Normal rate, regular rhythm, normal heart sounds and intact distal pulses.   Pulmonary/Chest: WOB normal and breath sounds without rales or wheezing  Abdominal: Soft. Bowel sounds are normal. NT. No HSM  Musculoskeletal: Normal range of motion. Exhibits no edema Lymphadenopathy: Has no other cervical adenopathy.  Neurological: Pt is alert and oriented to person, place, and time. Pt has normal reflexes. No cranial nerve deficit. Motor grossly intact except 4/5 LLE, stiff and pain to lower back to standing up from sitting, Gait slow and antalgic, favoring the LLE Skin: Skin is warm and dry. No rash noted or new ulcerations Psychiatric:  Has normal mood and affect. Behavior is normal without agitation No other exam findings    Assessment & Plan:

## 2017-01-30 NOTE — Patient Instructions (Addendum)
You had the flu shot today, and the Prevnar 13 pneumonia shot  OK to increase the losartan to 100 mg per day  Please continue all other medications as before, and refills have been done if requested.  Please have the pharmacy call with any other refills you may need.  Please continue your efforts at being more active, low cholesterol diet, and weight control.  You are otherwise up to date with prevention measures today.  You will be contacted regarding the referral for: GYN in 3 mo, Podiatry in 3 mo, colonoscopy about 6 months, and mammogram  Please keep your appointments with your specialists as you may have planned  Please go to the LAB in the Basement (turn left off the elevator) for the tests to be done at your convenience the earliest you can next week  You will be contacted by phone if any changes need to be made immediately.  Otherwise, you will receive a letter about your results with an explanation, but please check with MyChart first.  Please remember to sign up for MyChart if you have not done so, as this will be important to you in the future with finding out test results, communicating by private email, and scheduling acute appointments online when needed.  Please return in 6 months, or sooner if needed, with Lab testing done 3-5 days before  Good Luck with your Back Surgury; you form will be sent for OK for surgury

## 2017-01-31 ENCOUNTER — Encounter: Payer: Self-pay | Admitting: Internal Medicine

## 2017-01-31 DIAGNOSIS — M5416 Radiculopathy, lumbar region: Secondary | ICD-10-CM | POA: Insufficient documentation

## 2017-01-31 DIAGNOSIS — Z01818 Encounter for other preprocedural examination: Secondary | ICD-10-CM | POA: Insufficient documentation

## 2017-01-31 HISTORY — DX: Radiculopathy, lumbar region: M54.16

## 2017-01-31 NOTE — Assessment & Plan Note (Signed)
stable overall by history and exam, recent data reviewed with pt, and pt to continue medical treatment as before,  to f/u any worsening symptoms or concerns BP Readings from Last 3 Encounters:  01/30/17 (!) 154/88  01/21/17 (!) 193/70  11/12/16 128/82  mild uncontrolled, for increased losartan 100 qd, f/u BP at home and next visit

## 2017-01-31 NOTE — Assessment & Plan Note (Addendum)
[place] for [employer] pending lab evaluation  Note:  Total time for pt hx, exam, review of record with pt in the room, determination of diagnoses and plan for further eval and tx is > 40 min, with over 50% spent in coordination and counseling of patient including the differential dx, tx, further evaluation and other management of preop for internal medicine, left lumbar radiculopathy, uncontrolled HTN and DM

## 2017-01-31 NOTE — Assessment & Plan Note (Signed)
Lab Results  Component Value Date   HGBA1C 7.7 (H) 09/07/2016  mild uncontrolled last visit, cont same tx for now, for f/u a1c with labs

## 2017-01-31 NOTE — Assessment & Plan Note (Signed)

## 2017-01-31 NOTE — Assessment & Plan Note (Signed)
With persistent pain, for ortho f/u as planned

## 2017-02-01 ENCOUNTER — Ambulatory Visit: Payer: Self-pay | Admitting: Orthopedic Surgery

## 2017-02-01 NOTE — H&P (Signed)
Analicia Skibinski is an 56 y.o. female.   Chief Complaint: back and L leg pain HPI: The patient is a 56 year old female who presents with back pain. The patient is here today in referral from Dr. Nelva Bush. The patient reports low back symptoms including pain which began 1 week(s) ago without any known injury. Symptoms are reported to be located in the left low back and Symptoms include numbness and weakness. The pain radiates to the left buttock, left thigh and left lower leg. The patient describes the severity of their symptoms as severe. Symptoms are exacerbated by standing. Current treatment includes non-opioid analgesics (Tylenol). Prior to being seen today the patient was previously evaluated in the emergency room (and by Dr. Nelva Bush). Past evaluation has included x-ray of the lumbar spine and MRI of the lumbar spine. Past treatment has included opioid analgesics. Note for "Back pain": The patient states that she has experienced back symptoms since 2008, but the most recent episode began a week ago.  Past Medical History:  Diagnosis Date  . Depression   . Diabetes mellitus without complication ([place])   . Diverticulitis   . Eating disorder   . Frequent headaches   . GERD (gastroesophageal reflux disease)   . History of fainting spells of unknown cause   . Hyperlipidemia   . Hypertension   . PTSD (post-traumatic stress disorder)   . Renal disorder   . Vitamin D deficiency 01/30/2017    Past Surgical History:  Procedure Laterality Date  . BUNIONECTOMY    . c-section     . CESAREAN SECTION    . URETHROPLASTY      Family History  Problem Relation Age of Onset  . Depression Mother   . Diabetes Mother   . Hypertension Mother   . Hyperlipidemia Mother   . Depression Maternal Grandmother    Social History:  reports that she has been smoking cigarettes.  She has a 35.00 pack-year smoking history. she has never used smokeless tobacco. She reports that she does not drink alcohol or use  drugs.  Allergies:  Allergies  Allergen Reactions  . Asa [Aspirin] Other (See Comments)    Stomach burns   . Morphine And Related Itching  . Oxycodone     "it makes me feel crazy"     (Not in a hospital admission)  No results found for this or any previous visit (from the past 48 hour(s)). No results found.  Review of Systems  Constitutional: Negative.   HENT: Negative.   Eyes: Negative.   Respiratory: Negative.   Cardiovascular: Negative.   Gastrointestinal: Negative.   Genitourinary: Negative.   Musculoskeletal: Positive for back pain.  Skin: Negative.   Neurological: Positive for sensory change and focal weakness.  Psychiatric/Behavioral: Negative.     There were no vitals taken for this visit. Physical Exam  Constitutional: She is oriented to person, place, and time. She appears well-developed and well-nourished. She appears distressed.  HENT:  Head: Normocephalic.  Eyes: Pupils are equal, round, and reactive to light.  Neck: Normal range of motion.  Cardiovascular: Normal rate.  Respiratory: Effort normal.  GI: Soft.  Musculoskeletal:  Patient in severe distress plan on the exam table on her side with her knees drawn up. She reports decreased sensation in the L2 dermatome has some hip flexor weakness. Exam was difficult due to the patient's pain. I 1+ DTRs in the knees. Achilles. 1+ dorsalis pedis posterior tibial pulse. No DVT. Global limited range of motion lumbar spine.  Pelvis stable. Nontender thoracic abdomen soft. Pounds range of motion cervical spine motors 5 5 the upper extremity is. No Hoffmann sign. Walks with a forward flexed antalgic gait with the knee flexed. Has to use a walker.  Neurological: She is alert and oriented to person, place, and time.  Skin: Skin is warm.    X-rays 3 view lumbar spine demonstrates multilevel disc degeneration. Tickly noted at 3 4. Is calcification of the aorta  MRI demonstrates a free fragment extending into the left L2  foramen peripheral nerve sheath tumor considered less likely. Severe disc degeneration L3-4.  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 She has hip flexor weakness and numbness in the L2 nerve root distribution  She has severe pain.  We talked about an epidural or selective nerve root block at L2 however she very well may require surgical intervention  We will attempt to obtain preoperative clearance for that purpose. She is a smoker we advised her against smoking indicating the deleterious side effects spine healing infection  Prescription for Percocet was given to be taken as directed. She is not somnolent for the Percocet she was using from the emergency room. This is neural compression with severe pain. Use a walker flex the leg fetal position discussed this with she and her daughter. Therapy will not help her and in fact will aggravate her at this point. Discussed risks and benefits including bleeding infection damage to nerve structures no changes in her symptoms worsening in symptoms DVT PE and anesthetic complications. This is not fix her arthritis essentially remove his compression of the nerve hopefully the nerve will heal itself. Again in the interim we will obtain L2 nerve block to try to get her some relief. Please send a copy of this visit to her primary care medical physician  Plan microlumbar decompression L2-3  Cecilie Kicks., PA-C for Dr. Tonita Cong 02/01/2017, 2:35 PM

## 2017-02-01 NOTE — H&P (View-Only) (Signed)
[name] is an 56 y.o. female.   Chief Complaint: back and L leg pain HPI: The patient is a 56 year old female who presents with back pain. The patient is here today in referral from Dr. Nelva Bush. The patient reports low back symptoms including pain which began 1 week(s) ago without any known injury. Symptoms are reported to be located in the left low back and Symptoms include numbness and weakness. The pain radiates to the left buttock, left thigh and left lower leg. The patient describes the severity of their symptoms as severe. Symptoms are exacerbated by standing. Current treatment includes non-opioid analgesics (Tylenol). Prior to being seen today the patient was previously evaluated in the emergency room (and by Dr. Nelva Bush). Past evaluation has included x-ray of the lumbar spine and MRI of the lumbar spine. Past treatment has included opioid analgesics. Note for "Back pain": The patient states that she has experienced back symptoms since 2008, but the most recent episode began a week ago.  Past Medical History:  Diagnosis Date  . Depression   . Diabetes mellitus without complication ([place])   . Diverticulitis   . Eating disorder   . Frequent headaches   . GERD (gastroesophageal reflux disease)   . History of fainting spells of unknown cause   . Hyperlipidemia   . Hypertension   . PTSD (post-traumatic stress disorder)   . Renal disorder   . Vitamin D deficiency 01/30/2017    Past Surgical History:  Procedure Laterality Date  . BUNIONECTOMY    . c-section     . CESAREAN SECTION    . URETHROPLASTY      Family History  Problem Relation Age of Onset  . Depression Mother   . Diabetes Mother   . Hypertension Mother   . Hyperlipidemia Mother   . Depression Maternal Grandmother    Social History:  reports that she has been smoking cigarettes.  She has a 35.00 pack-year smoking history. she has never used smokeless tobacco. She reports that she does not drink alcohol or use  drugs.  Allergies:  Allergies  Allergen Reactions  . Asa [Aspirin] Other (See Comments)    Stomach burns   . Morphine And Related Itching  . Oxycodone     "it makes me feel crazy"     (Not in a hospital admission)  No results found for this or any previous visit (from the past 48 hour(s)). No results found.  Review of Systems  Constitutional: Negative.   HENT: Negative.   Eyes: Negative.   Respiratory: Negative.   Cardiovascular: Negative.   Gastrointestinal: Negative.   Genitourinary: Negative.   Musculoskeletal: Positive for back pain.  Skin: Negative.   Neurological: Positive for sensory change and focal weakness.  Psychiatric/Behavioral: Negative.     There were no vitals taken for this visit. Physical Exam  Constitutional: She is oriented to person, place, and time. She appears well-developed and well-nourished. She appears distressed.  HENT:  Head: Normocephalic.  Eyes: Pupils are equal, round, and reactive to light.  Neck: Normal range of motion.  Cardiovascular: Normal rate.  Respiratory: Effort normal.  GI: Soft.  Musculoskeletal:  Patient in severe distress plan on the exam table on her side with her knees drawn up. She reports decreased sensation in the L2 dermatome has some hip flexor weakness. Exam was difficult due to the patient's pain. I 1+ DTRs in the knees. Achilles. 1+ dorsalis pedis posterior tibial pulse. No DVT. Global limited range of motion lumbar spine.  Pelvis stable. Nontender thoracic abdomen soft. Pounds range of motion cervical spine motors 5 5 the upper extremity is. No Hoffmann sign. Walks with a forward flexed antalgic gait with the knee flexed. Has to use a walker.  Neurological: She is alert and oriented to person, place, and time.  Skin: Skin is warm.    X-rays 3 view lumbar spine demonstrates multilevel disc degeneration. Tickly noted at 3 4. Is calcification of the aorta  MRI demonstrates a free fragment extending into the left L2  foramen peripheral nerve sheath tumor considered less likely. Severe disc degeneration L3-4.  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 She has hip flexor weakness and numbness in the L2 nerve root distribution  She has severe pain.  We talked about an epidural or selective nerve root block at L2 however she very well may require surgical intervention  We will attempt to obtain preoperative clearance for that purpose. She is a smoker we advised her against smoking indicating the deleterious side effects spine healing infection  Prescription for Percocet was given to be taken as directed. She is not somnolent for the Percocet she was using from the emergency room. This is neural compression with severe pain. Use a walker flex the leg fetal position discussed this with she and her daughter. Therapy will not help her and in fact will aggravate her at this point. Discussed risks and benefits including bleeding infection damage to nerve structures no changes in her symptoms worsening in symptoms DVT PE and anesthetic complications. This is not fix her arthritis essentially remove his compression of the nerve hopefully the nerve will heal itself. Again in the interim we will obtain L2 nerve block to try to get her some relief. Please send a copy of this visit to her primary care medical physician  Plan microlumbar decompression L2-3  [name]., PA-C for Dr. Tonita Cong 02/01/2017, 2:35 PM

## 2017-02-03 ENCOUNTER — Other Ambulatory Visit (INDEPENDENT_AMBULATORY_CARE_PROVIDER_SITE_OTHER)

## 2017-02-03 DIAGNOSIS — Z114 Encounter for screening for human immunodeficiency virus [HIV]: Secondary | ICD-10-CM

## 2017-02-03 DIAGNOSIS — E119 Type 2 diabetes mellitus without complications: Secondary | ICD-10-CM

## 2017-02-03 DIAGNOSIS — Z1159 Encounter for screening for other viral diseases: Secondary | ICD-10-CM

## 2017-02-03 LAB — BASIC METABOLIC PANEL
BUN: 12 mg/dL (ref 6–23)
CO2: 28 meq/L (ref 19–32)
Calcium: 9.3 mg/dL (ref 8.4–10.5)
Chloride: 102 mEq/L (ref 96–112)
Creatinine, Ser: 1 mg/dL (ref 0.40–1.20)
GFR: 74.3 mL/min (ref >60.00)
GLUCOSE: 179 mg/dL — AB (ref 70–99)
POTASSIUM: 3.2 meq/L — AB (ref 3.5–5.1)
SODIUM: 141 meq/L (ref 135–145)

## 2017-02-03 LAB — HEPATIC FUNCTION PANEL
ALT: 18 U/L (ref 0–35)
AST: 23 U/L (ref 0–37)
Albumin: 4.4 g/dL (ref 3.5–5.2)
Alkaline Phosphatase: 90 U/L (ref 39–117)
BILIRUBIN TOTAL: 1 mg/dL (ref 0.2–1.2)
Bilirubin, Direct: 0.2 mg/dL (ref 0.0–0.3)
TOTAL PROTEIN: 7.6 g/dL (ref 6.0–8.3)

## 2017-02-03 LAB — LIPID PANEL
CHOL/HDL RATIO: 3
Cholesterol: 168 mg/dL (ref 0–200)
HDL: 54.7 mg/dL (ref >39.00)
LDL Cholesterol: 93 mg/dL (ref 0–99)
NonHDL: 113.68
Triglycerides: 105 mg/dL (ref 0.0–149.0)
VLDL: 21 mg/dL (ref 0.0–40.0)

## 2017-02-03 LAB — CBC WITH DIFFERENTIAL/PLATELET
Basophils Absolute: 0.1 10*3/uL (ref 0.0–0.1)
Basophils Relative: 1.1 % (ref 0.0–3.0)
EOS ABS: 0.3 10*3/uL (ref 0.0–0.7)
Eosinophils Relative: 5.1 % — ABNORMAL HIGH (ref 0.0–5.0)
HCT: 40.5 % (ref 36.0–46.0)
HEMOGLOBIN: 13.5 g/dL (ref 12.0–15.0)
Lymphocytes Relative: 49.4 % — ABNORMAL HIGH (ref 12.0–46.0)
Lymphs Abs: 3.4 10*3/uL (ref 0.7–4.0)
MCHC: 33.3 g/dL (ref 30.0–36.0)
MCV: 93.3 fl (ref 78.0–100.0)
MONO ABS: 0.5 10*3/uL (ref 0.1–1.0)
Monocytes Relative: 7.2 % (ref 3.0–12.0)
Neutro Abs: 2.5 10*3/uL (ref 1.4–7.7)
Neutrophils Relative %: 37.2 % — ABNORMAL LOW (ref 43.0–77.0)
Platelets: 265 10*3/uL (ref 150.0–400.0)
RBC: 4.34 Mil/uL (ref 3.87–5.11)
RDW: 14.4 % (ref 11.5–15.5)
WBC: 6.8 10*3/uL (ref 4.0–10.5)

## 2017-02-03 LAB — HEMOGLOBIN A1C: Hgb A1c MFr Bld: 7.7 % — ABNORMAL HIGH (ref 4.6–6.5)

## 2017-02-03 LAB — TSH: TSH: 0.5 u[IU]/mL (ref 0.35–4.50)

## 2017-02-04 ENCOUNTER — Other Ambulatory Visit: Payer: Self-pay | Admitting: Internal Medicine

## 2017-02-04 ENCOUNTER — Telehealth: Payer: Self-pay

## 2017-02-04 LAB — HEPATITIS C ANTIBODY
Hepatitis C Ab: NONREACTIVE
SIGNAL TO CUT-OFF: 0.03 (ref <1.00)

## 2017-02-04 LAB — HIV ANTIBODY (ROUTINE TESTING W REFLEX): HIV: NONREACTIVE

## 2017-02-04 MED ORDER — POTASSIUM CHLORIDE ER 10 MEQ PO TBCR: 10.0000 meq | EXTENDED_RELEASE_TABLET | Freq: Every day | ORAL | 0 refills | Status: DC

## 2017-02-04 MED ORDER — METFORMIN HCL 500 MG PO TABS: ORAL_TABLET | ORAL | 3 refills | Status: DC

## 2017-02-04 NOTE — Telephone Encounter (Signed)
Pt has been informed and expressed understanding.  

## 2017-02-04 NOTE — Telephone Encounter (Signed)
-----   Message from Biagio Borg, MD sent at 02/04/2017 12:35 PM EST ----- Left message on MyChart, pt to cont same tx except  The test results show that your current treatment is OK, except the A1c is mildly high, and the potassium mildly low.  We will need to change the metformin to 100 mg in the AM, and 500 mg in the PM, as well as a prescription for a few days of potasisum.    Max Nuno to please inform pt, I will do rx x 2

## 2017-02-05 ENCOUNTER — Other Ambulatory Visit (HOSPITAL_COMMUNITY): Payer: Self-pay | Admitting: Emergency Medicine

## 2017-02-05 NOTE — Progress Notes (Signed)
[facility] [place] 05-12-16 epic care everywhere,   Cbcdiff, bmp, hga1c 02-03-17 epic   CT chest 03-05-16 epic

## 2017-02-05 NOTE — Patient Instructions (Signed)
[name]  02/05/2017   Your procedure is scheduled on: 02-15-17  Report to [facility] Main  Entrance Take [place]  elevators to 3rd floor to  [place] at [phone].   Call this number if you have problems the morning of surgery [phone]    Remember: ONLY 1 PERSON MAY GO WITH YOU TO SHORT STAY TO GET  READY MORNING OF [place].  Do not eat food or drink liquids :After Midnight.     Take these medicines the morning of surgery with A SIP OF WATER: amlodipine, atorvastatin, Wellbutrin, ativan if needed, pantoprazole, sertraline                                You may not have any metal on your body including hair pins and              piercings  Do not wear jewelry, make-up, lotions, powders or perfumes, deodorant             Do not wear nail polish.  Do not shave  48 hours prior to surgery.      Do not bring valuables to the hospital. [place].  Contacts, dentures or bridgework may not be worn into surgery.  Leave suitcase in the car. After surgery it may be brought to your room.               Please read over the following fact sheets you were given: _____________________________________________________________________            How to Manage Your Diabetes Before and After Surgery  Why is it important to control my blood sugar before and after surgery? . Improving blood sugar levels before and after surgery helps healing and can limit problems. . A way of improving blood sugar control is eating a healthy diet by: o  Eating less sugar and carbohydrates o  Increasing activity/exercise o  Talking with your doctor about reaching your blood sugar goals . High blood sugars (greater than 180 mg/dL) can raise your risk of infections and slow your recovery, so you will need to focus on controlling your diabetes during the weeks before surgery. . Make sure that the doctor who takes care of  your diabetes knows about your planned surgery including the date and location.  How do I manage my blood sugar before surgery? . Check your blood sugar at least 4 times a day, starting 2 days before surgery, to make sure that the level is not too high or low. o Check your blood sugar the morning of your surgery when you wake up and every 2 hours until you get to the Short Stay unit. . If your blood sugar is less than 70 mg/dL, you will need to treat for low blood sugar: o Do not take insulin. o Treat a low blood sugar (less than 70 mg/dL) with  cup of clear juice (cranberry or apple), 4 glucose tablets, OR glucose gel. o Recheck blood sugar in 15 minutes after treatment (to make sure it is greater than 70 mg/dL). If your blood sugar is not greater than 70 mg/dL on recheck, call [phone] for further instructions. . Report your blood sugar to the short stay nurse when  you get to Short Stay.  . If you are admitted to the hospital after surgery: o Your blood sugar will be checked by the staff and you will probably be given insulin after surgery (instead of oral diabetes medicines) to make sure you have good blood sugar levels. o The goal for blood sugar control after surgery is 80-180 mg/dL.   WHAT DO I DO ABOUT MY DIABETES MEDICATION?   . THE DAY BEFORE SURGERY, take METFORMIN as normal.      . THE MORNING OF SURGERY, Do not take oral diabetes medicines (pills)    Patient Signature:  Date:   Nurse Signature:  Date:   Reviewed and Endorsed by [place] Patient Education Committee, August 2015   [facility] - Preparing for Surgery Before surgery, you can play an important role.  Because skin is not sterile, your skin needs to be as free of germs as possible.  You can reduce the number of germs on your skin by washing with CHG (chlorahexidine gluconate) soap before surgery.  CHG is an antiseptic cleaner which kills germs and bonds with the skin to continue killing germs even after  washing. Please DO NOT use if you have an allergy to CHG or antibacterial soaps.  If your skin becomes reddened/irritated stop using the CHG and inform your nurse when you arrive at Short Stay. Do not shave (including legs and underarms) for at least 48 hours prior to the first CHG shower.  You may shave your face/neck. Please follow these instructions carefully:  1.  Shower with CHG Soap the night before surgery and the  morning of Surgery.  2.  If you choose to wash your hair, wash your hair first as usual with your  normal  shampoo.  3.  After you shampoo, rinse your hair and body thoroughly to remove the  shampoo.                           4.  Use CHG as you would any other liquid soap.  You can apply chg directly  to the skin and wash                       Gently with a scrungie or clean washcloth.  5.  Apply the CHG Soap to your body ONLY FROM THE NECK DOWN.   Do not use on face/ open                           Wound or open sores. Avoid contact with eyes, ears mouth and genitals (private parts).                       Wash face,  Genitals (private parts) with your normal soap.             6.  Wash thoroughly, paying special attention to the area where your surgery  will be performed.  7.  Thoroughly rinse your body with warm water from the neck down.  8.  DO NOT shower/wash with your normal soap after using and rinsing off  the CHG Soap.                9.  Pat yourself dry with a clean towel.            10.  Wear clean pajamas.  11.  Place clean sheets on your bed the night of your first shower and do not  sleep with pets. Day of Surgery : Do not apply any lotions/deodorants the morning of surgery.  Please wear clean clothes to the hospital/surgery center.  FAILURE TO FOLLOW THESE INSTRUCTIONS MAY RESULT IN THE CANCELLATION OF YOUR SURGERY PATIENT SIGNATURE_________________________________  NURSE  SIGNATURE__________________________________  ________________________________________________________________________   Adam Phenix  An incentive spirometer is a tool that can help keep your lungs clear and active. This tool measures how well you are filling your lungs with each breath. Taking long deep breaths may help reverse or decrease the chance of developing breathing (pulmonary) problems (especially infection) following:  A long period of time when you are unable to move or be active. BEFORE THE PROCEDURE   If the spirometer includes an indicator to show your [place] effort, your nurse or respiratory therapist will set it to a desired goal.  If possible, sit up straight or lean slightly forward. Try not to slouch.  Hold the incentive spirometer in an upright position. INSTRUCTIONS FOR USE  1. Sit on the edge of your bed if possible, or sit up as far as you can in bed or on a chair. 2. Hold the incentive spirometer in an upright position. 3. Breathe out normally. 4. Place the mouthpiece in your mouth and seal your lips tightly around it. 5. Breathe in slowly and as deeply as possible, raising the piston or the ball toward the top of the column. 6. Hold your breath for 3-5 seconds or for as long as possible. Allow the piston or ball to fall to the bottom of the column. 7. Remove the mouthpiece from your mouth and breathe out normally. 8. Rest for a few seconds and repeat Steps 1 through 7 at least 10 times every 1-2 hours when you are awake. Take your time and take a few normal breaths between deep breaths. 9. The spirometer may include an indicator to show your [place] effort. Use the indicator as a goal to work toward during each repetition. 10. After each set of 10 deep breaths, practice coughing to be sure your lungs are clear. If you have an incision (the cut made at the time of surgery), support your incision when coughing by placing a pillow or rolled up towels firmly  against it. Once you are able to get out of bed, walk around indoors and cough well. You may stop using the incentive spirometer when instructed by your caregiver.  RISKS AND COMPLICATIONS  Take your time so you do not get dizzy or light-headed.  If you are in pain, you may need to take or ask for pain medication before doing incentive spirometry. It is harder to take a deep breath if you are having pain. AFTER USE  Rest and breathe slowly and easily.  It can be helpful to keep track of a log of your progress. Your caregiver can provide you with a simple table to help with this. If you are using the spirometer at home, follow these instructions: [place] IF:   You are having difficultly using the spirometer.  You have trouble using the spirometer as often as instructed.  Your pain medication is not giving enough relief while using the spirometer.  You develop fever of 100.5 F (38.1 C) or higher. SEEK IMMEDIATE MEDICAL CARE IF:   You cough up bloody sputum that had not been present before.  You develop fever of 102 F (38.9 C)  or greater.  You develop worsening pain at or near the incision site. MAKE SURE YOU:   Understand these instructions.  Will watch your condition.  Will get help right away if you are not doing well or get worse. Document Released: 06/15/2006 Document Revised: 04/27/2011 Document Reviewed: 08/16/2006 [facility] Patient Information 2014 [place], [place].   ________________________________________________________________________

## 2017-02-10 ENCOUNTER — Ambulatory Visit (HOSPITAL_COMMUNITY)
Admission: RE | Admit: 2017-02-10 | Discharge: 2017-02-10 | Disposition: A | Discharge status: Home / Self Care | Source: Ambulatory Visit | Attending: Orthopedic Surgery | Admitting: Orthopedic Surgery

## 2017-02-10 ENCOUNTER — Encounter (HOSPITAL_COMMUNITY)
Admission: RE | Admit: 2017-02-10 | Discharge: 2017-02-10 | Disposition: A | Discharge status: Home / Self Care | Source: Ambulatory Visit | Attending: Specialist | Admitting: Specialist

## 2017-02-10 ENCOUNTER — Encounter (INDEPENDENT_AMBULATORY_CARE_PROVIDER_SITE_OTHER): Payer: Self-pay

## 2017-02-10 ENCOUNTER — Other Ambulatory Visit: Payer: Self-pay

## 2017-02-10 ENCOUNTER — Encounter (HOSPITAL_COMMUNITY): Payer: Self-pay

## 2017-02-10 DIAGNOSIS — M5126 Other intervertebral disc displacement, lumbar region: Secondary | ICD-10-CM

## 2017-02-10 DIAGNOSIS — M5136 Other intervertebral disc degeneration, lumbar region: Secondary | ICD-10-CM | POA: Diagnosis not present

## 2017-02-10 DIAGNOSIS — I700 Atherosclerosis of aorta: Secondary | ICD-10-CM | POA: Insufficient documentation

## 2017-02-10 DIAGNOSIS — Z01818 Encounter for other preprocedural examination: Secondary | ICD-10-CM | POA: Insufficient documentation

## 2017-02-10 DIAGNOSIS — Z01812 Encounter for preprocedural laboratory examination: Secondary | ICD-10-CM | POA: Insufficient documentation

## 2017-02-10 HISTORY — DX: Pneumonia, unspecified organism: J18.9

## 2017-02-10 HISTORY — DX: Other specified cardiac arrhythmias: I49.8

## 2017-02-10 HISTORY — DX: Inflammatory liver disease, unspecified: K75.9

## 2017-02-10 HISTORY — DX: Other specified postprocedural states: Z98.890

## 2017-02-10 HISTORY — DX: Nausea with vomiting, unspecified: R11.2

## 2017-02-10 HISTORY — DX: Angina pectoris, unspecified: I20.9

## 2017-02-10 LAB — BASIC METABOLIC PANEL
ANION GAP: 6 (ref 5–15)
BUN: 22 mg/dL — AB (ref 6–20)
CALCIUM: 9.6 mg/dL (ref 8.9–10.3)
CO2: 24 mmol/L (ref 22–32)
Chloride: 110 mmol/L (ref 101–111)
Creatinine, Ser: 0.9 mg/dL (ref 0.44–1.00)
GFR calc Af Amer: >60 mL/min (ref >60)
Glucose, Bld: 116 mg/dL — ABNORMAL HIGH (ref 65–99)
POTASSIUM: 4.2 mmol/L (ref 3.5–5.1)
SODIUM: 140 mmol/L (ref 135–145)

## 2017-02-10 LAB — CBC
HEMATOCRIT: 36.8 % (ref 36.0–46.0)
Hemoglobin: 12.4 g/dL (ref 12.0–15.0)
MCH: 31.1 pg (ref 26.0–34.0)
MCHC: 33.7 g/dL (ref 30.0–36.0)
MCV: 92.2 fL (ref 78.0–100.0)
Platelets: 286 10*3/uL (ref 150–400)
RBC: 3.99 MIL/uL (ref 3.87–5.11)
RDW: 14.3 % (ref 11.5–15.5)
WBC: 7.5 10*3/uL (ref 4.0–10.5)

## 2017-02-10 LAB — GLUCOSE, CAPILLARY: GLUCOSE-CAPILLARY: 131 mg/dL — AB (ref 65–99)

## 2017-02-10 LAB — SURGICAL PCR SCREEN
MRSA, PCR: NEGATIVE
STAPHYLOCOCCUS AUREUS: NEGATIVE

## 2017-02-10 LAB — HCG, SERUM, QUALITATIVE: PREG SERUM: NEGATIVE

## 2017-02-10 NOTE — Progress Notes (Signed)
Faxed [facility] requesting any ekg, stress test , echo

## 2017-02-12 NOTE — Progress Notes (Signed)
EKG 05-12-16 on chart from [facility]

## 2017-02-15 ENCOUNTER — Ambulatory Visit (HOSPITAL_COMMUNITY)

## 2017-02-15 ENCOUNTER — Ambulatory Visit (HOSPITAL_COMMUNITY): Admitting: Certified Registered Nurse Anesthetist

## 2017-02-15 ENCOUNTER — Encounter (HOSPITAL_COMMUNITY): Payer: Self-pay | Admitting: Certified Registered Nurse Anesthetist

## 2017-02-15 ENCOUNTER — Encounter (HOSPITAL_COMMUNITY)
Admission: RE | Disposition: A | Discharge status: Home / Self Care | Payer: Self-pay | Source: Ambulatory Visit | Attending: Specialist

## 2017-02-15 ENCOUNTER — Other Ambulatory Visit: Payer: Self-pay

## 2017-02-15 ENCOUNTER — Ambulatory Visit (HOSPITAL_COMMUNITY)
Admission: RE | Admit: 2017-02-15 | Discharge: 2017-02-16 | Disposition: A | Discharge status: Home / Self Care | Source: Ambulatory Visit | Attending: Specialist | Admitting: Specialist

## 2017-02-15 DIAGNOSIS — E559 Vitamin D deficiency, unspecified: Secondary | ICD-10-CM | POA: Insufficient documentation

## 2017-02-15 DIAGNOSIS — Z888 Allergy status to other drugs, medicaments and biological substances status: Secondary | ICD-10-CM | POA: Diagnosis not present

## 2017-02-15 DIAGNOSIS — M5126 Other intervertebral disc displacement, lumbar region: Secondary | ICD-10-CM

## 2017-02-15 DIAGNOSIS — Z419 Encounter for procedure for purposes other than remedying health state, unspecified: Secondary | ICD-10-CM

## 2017-02-15 DIAGNOSIS — E119 Type 2 diabetes mellitus without complications: Secondary | ICD-10-CM | POA: Insufficient documentation

## 2017-02-15 DIAGNOSIS — F17210 Nicotine dependence, cigarettes, uncomplicated: Secondary | ICD-10-CM | POA: Diagnosis not present

## 2017-02-15 DIAGNOSIS — Z885 Allergy status to narcotic agent status: Secondary | ICD-10-CM | POA: Insufficient documentation

## 2017-02-15 DIAGNOSIS — E785 Hyperlipidemia, unspecified: Secondary | ICD-10-CM | POA: Diagnosis not present

## 2017-02-15 DIAGNOSIS — F329 Major depressive disorder, single episode, unspecified: Secondary | ICD-10-CM | POA: Insufficient documentation

## 2017-02-15 DIAGNOSIS — F4310 Post-traumatic stress disorder, unspecified: Secondary | ICD-10-CM | POA: Diagnosis not present

## 2017-02-15 DIAGNOSIS — Z8619 Personal history of other infectious and parasitic diseases: Secondary | ICD-10-CM | POA: Insufficient documentation

## 2017-02-15 DIAGNOSIS — Z886 Allergy status to analgesic agent status: Secondary | ICD-10-CM | POA: Insufficient documentation

## 2017-02-15 DIAGNOSIS — K219 Gastro-esophageal reflux disease without esophagitis: Secondary | ICD-10-CM | POA: Insufficient documentation

## 2017-02-15 DIAGNOSIS — M48061 Spinal stenosis, lumbar region without neurogenic claudication: Secondary | ICD-10-CM | POA: Diagnosis not present

## 2017-02-15 DIAGNOSIS — Z79899 Other long term (current) drug therapy: Secondary | ICD-10-CM | POA: Insufficient documentation

## 2017-02-15 DIAGNOSIS — I700 Atherosclerosis of aorta: Secondary | ICD-10-CM | POA: Diagnosis not present

## 2017-02-15 DIAGNOSIS — F419 Anxiety disorder, unspecified: Secondary | ICD-10-CM | POA: Diagnosis not present

## 2017-02-15 DIAGNOSIS — I10 Essential (primary) hypertension: Secondary | ICD-10-CM | POA: Insufficient documentation

## 2017-02-15 DIAGNOSIS — Z8719 Personal history of other diseases of the digestive system: Secondary | ICD-10-CM | POA: Insufficient documentation

## 2017-02-15 DIAGNOSIS — M5116 Intervertebral disc disorders with radiculopathy, lumbar region: Secondary | ICD-10-CM | POA: Insufficient documentation

## 2017-02-15 HISTORY — PX: LUMBAR LAMINECTOMY/DECOMPRESSION MICRODISCECTOMY: SHX5026

## 2017-02-15 HISTORY — DX: Other intervertebral disc displacement, lumbar region: M51.26

## 2017-02-15 LAB — GLUCOSE, CAPILLARY
GLUCOSE-CAPILLARY: 120 mg/dL — AB (ref 65–99)
GLUCOSE-CAPILLARY: 195 mg/dL — AB (ref 65–99)
GLUCOSE-CAPILLARY: 178 mg/dL — AB (ref 65–99)

## 2017-02-15 SURGERY — LUMBAR LAMINECTOMY/DECOMPRESSION MICRODISCECTOMY 1 LEVEL
Anesthesia: General | Laterality: Left

## 2017-02-15 MED ORDER — ONDANSETRON HCL 4 MG/2ML IJ SOLN
INTRAMUSCULAR | Status: AC
  Filled 2017-02-15: qty 2

## 2017-02-15 MED ORDER — FENTANYL CITRATE (PF) 100 MCG/2ML IJ SOLN
INTRAMUSCULAR | Status: AC
  Filled 2017-02-15: qty 2

## 2017-02-15 MED ORDER — CLINDAMYCIN PHOSPHATE 900 MG/50ML IV SOLN
900.0000 mg | INTRAVENOUS | Status: AC
  Administered 2017-02-15: 900 mg via INTRAVENOUS
  Filled 2017-02-15: qty 50

## 2017-02-15 MED ORDER — MEPERIDINE HCL 50 MG/ML IJ SOLN: 6.2500 mg | INTRAMUSCULAR | Status: DC | PRN

## 2017-02-15 MED ORDER — DEXAMETHASONE SODIUM PHOSPHATE 10 MG/ML IJ SOLN
INTRAMUSCULAR | Status: DC | PRN
  Administered 2017-02-15: 10 mg via INTRAVENOUS

## 2017-02-15 MED ORDER — PHENYLEPHRINE 40 MCG/ML (10ML) SYRINGE FOR IV PUSH (FOR BLOOD PRESSURE SUPPORT)
PREFILLED_SYRINGE | INTRAVENOUS | Status: DC | PRN
  Administered 2017-02-15 (×5): 80 ug via INTRAVENOUS
  Administered 2017-02-15: 40 ug via INTRAVENOUS
  Administered 2017-02-15: 120 ug via INTRAVENOUS
  Administered 2017-02-15 (×3): 80 ug via INTRAVENOUS

## 2017-02-15 MED ORDER — EPHEDRINE 5 MG/ML INJ
INTRAVENOUS | Status: AC
  Filled 2017-02-15: qty 10

## 2017-02-15 MED ORDER — ACETAMINOPHEN 650 MG RE SUPP: 650.0000 mg | RECTAL | Status: DC | PRN

## 2017-02-15 MED ORDER — DEXAMETHASONE SODIUM PHOSPHATE 10 MG/ML IJ SOLN
INTRAMUSCULAR | Status: AC
  Filled 2017-02-15: qty 1

## 2017-02-15 MED ORDER — PROPOFOL 10 MG/ML IV BOLUS
INTRAVENOUS | Status: AC
  Filled 2017-02-15: qty 20

## 2017-02-15 MED ORDER — ESMOLOL HCL 100 MG/10ML IV SOLN
INTRAVENOUS | Status: AC
  Filled 2017-02-15: qty 10

## 2017-02-15 MED ORDER — POLYETHYLENE GLYCOL 3350 17 G PO PACK: 17.0000 g | PACK | Freq: Every day | ORAL | Status: DC | PRN

## 2017-02-15 MED ORDER — POLYETHYLENE GLYCOL 3350 17 GM/SCOOP PO POWD
17.0000 g | Freq: Every day | ORAL | Status: DC
  Filled 2017-02-15: qty 255

## 2017-02-15 MED ORDER — MAGNESIUM CITRATE PO SOLN: 1.0000 | Freq: Once | ORAL | Status: DC | PRN

## 2017-02-15 MED ORDER — ACETAMINOPHEN 325 MG PO TABS: 650.0000 mg | ORAL_TABLET | ORAL | Status: DC | PRN

## 2017-02-15 MED ORDER — HYDROMORPHONE HCL 1 MG/ML IJ SOLN
0.2500 mg | INTRAMUSCULAR | Status: DC | PRN
Start: 2017-02-15 — End: 2017-02-15
  Administered 2017-02-15 (×6): 0.5 mg via INTRAVENOUS

## 2017-02-15 MED ORDER — POLYMYXIN B SULFATE 500000 UNITS IJ SOLR
INTRAMUSCULAR | Status: AC
  Filled 2017-02-15: qty 500000

## 2017-02-15 MED ORDER — DOCUSATE SODIUM 100 MG PO CAPS: 100.0000 mg | ORAL_CAPSULE | Freq: Two times a day (BID) | ORAL | 2 refills | Status: AC

## 2017-02-15 MED ORDER — CEFAZOLIN SODIUM-DEXTROSE 2-4 GM/100ML-% IV SOLN
2.0000 g | INTRAVENOUS | Status: AC
  Administered 2017-02-15: 2 g via INTRAVENOUS
  Filled 2017-02-15: qty 100

## 2017-02-15 MED ORDER — PHENYLEPHRINE 40 MCG/ML (10ML) SYRINGE FOR IV PUSH (FOR BLOOD PRESSURE SUPPORT)
PREFILLED_SYRINGE | INTRAVENOUS | Status: AC
  Filled 2017-02-15: qty 10

## 2017-02-15 MED ORDER — SUGAMMADEX SODIUM 200 MG/2ML IV SOLN
INTRAVENOUS | Status: DC | PRN
Start: 2017-02-15 — End: 2017-02-15
  Administered 2017-02-15: 120 mg via INTRAVENOUS

## 2017-02-15 MED ORDER — AMLODIPINE BESYLATE 10 MG PO TABS
10.0000 mg | ORAL_TABLET | Freq: Every day | ORAL | Status: DC
  Filled 2017-02-15: qty 1

## 2017-02-15 MED ORDER — SODIUM CHLORIDE 0.9 % IV SOLN
INTRAVENOUS | Status: DC | PRN
  Administered 2017-02-15: 500 mL

## 2017-02-15 MED ORDER — ZOLPIDEM TARTRATE 5 MG PO TABS
5.0000 mg | ORAL_TABLET | Freq: Every evening | ORAL | Status: DC | PRN
  Administered 2017-02-15: 5 mg via ORAL
  Filled 2017-02-15: qty 1

## 2017-02-15 MED ORDER — OXYCODONE HCL 5 MG PO TABS: 5.0000 mg | ORAL_TABLET | ORAL | Status: DC | PRN

## 2017-02-15 MED ORDER — ALBUTEROL SULFATE HFA 108 (90 BASE) MCG/ACT IN AERS: 2.0000 | INHALATION_SPRAY | Freq: Four times a day (QID) | RESPIRATORY_TRACT | Status: DC | PRN

## 2017-02-15 MED ORDER — LIDOCAINE 2% (20 MG/ML) 5 ML SYRINGE
INTRAMUSCULAR | Status: DC | PRN
  Administered 2017-02-15: 60 mg via INTRAVENOUS

## 2017-02-15 MED ORDER — HYDROMORPHONE HCL 1 MG/ML IJ SOLN
INTRAMUSCULAR | Status: AC
  Filled 2017-02-15: qty 1

## 2017-02-15 MED ORDER — ESMOLOL HCL 100 MG/10ML IV SOLN
INTRAVENOUS | Status: DC | PRN
  Administered 2017-02-15: 40 mg via INTRAVENOUS

## 2017-02-15 MED ORDER — ONDANSETRON HCL 4 MG/2ML IJ SOLN: 4.0000 mg | Freq: Four times a day (QID) | INTRAMUSCULAR | Status: DC | PRN

## 2017-02-15 MED ORDER — PANTOPRAZOLE SODIUM 40 MG PO TBEC
40.0000 mg | DELAYED_RELEASE_TABLET | Freq: Every day | ORAL | Status: DC
  Administered 2017-02-15 – 2017-02-16 (×2): 40 mg via ORAL
  Filled 2017-02-15 (×2): qty 1

## 2017-02-15 MED ORDER — POLYETHYLENE GLYCOL 3350 17 G PO PACK
17.0000 g | PACK | Freq: Every day | ORAL | Status: DC
  Administered 2017-02-16: 09:00:00 17 g via ORAL
  Filled 2017-02-15 (×2): qty 1

## 2017-02-15 MED ORDER — LACTATED RINGERS IV SOLN
INTRAVENOUS | Status: DC
  Administered 2017-02-15 (×2): via INTRAVENOUS

## 2017-02-15 MED ORDER — FENTANYL CITRATE (PF) 100 MCG/2ML IJ SOLN
INTRAMUSCULAR | Status: DC | PRN
  Administered 2017-02-15 (×3): 50 ug via INTRAVENOUS
  Administered 2017-02-15: 100 ug via INTRAVENOUS

## 2017-02-15 MED ORDER — CONJ ESTROG-MEDROXYPROGEST ACE 0.3-1.5 MG PO TABS: 1.0000 | ORAL_TABLET | Freq: Every day | ORAL | Status: DC

## 2017-02-15 MED ORDER — FENTANYL CITRATE (PF) 250 MCG/5ML IJ SOLN
INTRAMUSCULAR | Status: AC
  Filled 2017-02-15: qty 5

## 2017-02-15 MED ORDER — ROCURONIUM BROMIDE 50 MG/5ML IV SOSY
PREFILLED_SYRINGE | INTRAVENOUS | Status: DC | PRN
  Administered 2017-02-15: 50 mg via INTRAVENOUS

## 2017-02-15 MED ORDER — ACETAMINOPHEN 10 MG/ML IV SOLN
1000.0000 mg | INTRAVENOUS | Status: AC
  Administered 2017-02-15: 1000 mg via INTRAVENOUS
  Filled 2017-02-15: qty 100

## 2017-02-15 MED ORDER — THROMBIN (RECOMBINANT) 5000 UNITS EX SOLR
CUTANEOUS | Status: AC
  Filled 2017-02-15: qty 10000

## 2017-02-15 MED ORDER — BUPIVACAINE-EPINEPHRINE (PF) 0.5% -1:200000 IJ SOLN
INTRAMUSCULAR | Status: AC
  Filled 2017-02-15: qty 30

## 2017-02-15 MED ORDER — ROCURONIUM BROMIDE 50 MG/5ML IV SOSY
PREFILLED_SYRINGE | INTRAVENOUS | Status: AC
  Filled 2017-02-15: qty 5

## 2017-02-15 MED ORDER — LORAZEPAM 0.5 MG PO TABS
0.5000 mg | ORAL_TABLET | Freq: Every day | ORAL | Status: DC | PRN
  Administered 2017-02-15 – 2017-02-16 (×2): 0.5 mg via ORAL
  Filled 2017-02-15 (×2): qty 1

## 2017-02-15 MED ORDER — LIDOCAINE 2% (20 MG/ML) 5 ML SYRINGE
INTRAMUSCULAR | Status: AC
  Filled 2017-02-15: qty 5

## 2017-02-15 MED ORDER — OXYCODONE-ACETAMINOPHEN 5-325 MG PO TABS: 1.0000 | ORAL_TABLET | ORAL | 0 refills | Status: DC | PRN

## 2017-02-15 MED ORDER — POTASSIUM CHLORIDE IN NACL 20-0.9 MEQ/L-% IV SOLN
INTRAVENOUS | Status: DC
  Administered 2017-02-15: 18:00:00 via INTRAVENOUS
  Filled 2017-02-15 (×2): qty 1000

## 2017-02-15 MED ORDER — THROMBIN (RECOMBINANT) 5000 UNITS EX SOLR
OROMUCOSAL | Status: DC | PRN
  Administered 2017-02-15: 10 mL via TOPICAL

## 2017-02-15 MED ORDER — OXYCODONE HCL 5 MG PO TABS
10.0000 mg | ORAL_TABLET | ORAL | Status: DC | PRN
  Administered 2017-02-15 – 2017-02-16 (×5): 10 mg via ORAL
  Filled 2017-02-15 (×5): qty 2

## 2017-02-15 MED ORDER — ONDANSETRON 4 MG PO TBDP: 4.0000 mg | ORAL_TABLET | Freq: Three times a day (TID) | ORAL | Status: DC | PRN

## 2017-02-15 MED ORDER — BUPIVACAINE-EPINEPHRINE 0.5% -1:200000 IJ SOLN
INTRAMUSCULAR | Status: DC | PRN
  Administered 2017-02-15: 8 mL

## 2017-02-15 MED ORDER — PHENOL 1.4 % MT LIQD
1.0000 | OROMUCOSAL | Status: DC | PRN
  Filled 2017-02-15: qty 177

## 2017-02-15 MED ORDER — FENTANYL CITRATE (PF) 100 MCG/2ML IJ SOLN
50.0000 ug | INTRAMUSCULAR | Status: DC | PRN
  Administered 2017-02-15 (×2): 50 ug via INTRAVENOUS

## 2017-02-15 MED ORDER — MIDAZOLAM HCL 2 MG/2ML IJ SOLN
INTRAMUSCULAR | Status: AC
  Filled 2017-02-15: qty 2

## 2017-02-15 MED ORDER — SUGAMMADEX SODIUM 200 MG/2ML IV SOLN
INTRAVENOUS | Status: AC
  Filled 2017-02-15: qty 2

## 2017-02-15 MED ORDER — EPHEDRINE SULFATE-NACL 50-0.9 MG/10ML-% IV SOSY
PREFILLED_SYRINGE | INTRAVENOUS | Status: DC | PRN
  Administered 2017-02-15 (×5): 10 mg via INTRAVENOUS

## 2017-02-15 MED ORDER — HYDROMORPHONE HCL 1 MG/ML IJ SOLN: 0.5000 mg | INTRAMUSCULAR | Status: DC | PRN

## 2017-02-15 MED ORDER — ONDANSETRON HCL 4 MG/2ML IJ SOLN: 4.0000 mg | Freq: Once | INTRAMUSCULAR | Status: DC | PRN

## 2017-02-15 MED ORDER — SERTRALINE HCL 100 MG PO TABS
100.0000 mg | ORAL_TABLET | Freq: Every day | ORAL | Status: DC
  Administered 2017-02-16: 100 mg via ORAL
  Filled 2017-02-15: qty 1

## 2017-02-15 MED ORDER — LOSARTAN POTASSIUM 50 MG PO TABS
50.0000 mg | ORAL_TABLET | Freq: Two times a day (BID) | ORAL | Status: DC
  Administered 2017-02-15: 50 mg via ORAL
  Filled 2017-02-15 (×2): qty 1

## 2017-02-15 MED ORDER — METHOCARBAMOL 500 MG PO TABS
500.0000 mg | ORAL_TABLET | Freq: Four times a day (QID) | ORAL | Status: DC | PRN
  Administered 2017-02-15 – 2017-02-16 (×2): 500 mg via ORAL
  Filled 2017-02-15 (×2): qty 1

## 2017-02-15 MED ORDER — PREGABALIN 75 MG PO CAPS
150.0000 mg | ORAL_CAPSULE | Freq: Two times a day (BID) | ORAL | Status: DC
  Administered 2017-02-15 – 2017-02-16 (×2): 150 mg via ORAL
  Filled 2017-02-15 (×2): qty 2

## 2017-02-15 MED ORDER — MIDAZOLAM HCL 2 MG/2ML IJ SOLN
INTRAMUSCULAR | Status: DC | PRN
  Administered 2017-02-15: 2 mg via INTRAVENOUS

## 2017-02-15 MED ORDER — ALUM & MAG HYDROXIDE-SIMETH 200-200-20 MG/5ML PO SUSP: 30.0000 mL | Freq: Four times a day (QID) | ORAL | Status: DC | PRN

## 2017-02-15 MED ORDER — BUPROPION HCL 75 MG PO TABS
75.0000 mg | ORAL_TABLET | Freq: Two times a day (BID) | ORAL | Status: DC
  Administered 2017-02-15 – 2017-02-16 (×2): 75 mg via ORAL
  Filled 2017-02-15 (×2): qty 1

## 2017-02-15 MED ORDER — BISACODYL 5 MG PO TBEC: 5.0000 mg | DELAYED_RELEASE_TABLET | Freq: Every day | ORAL | Status: DC | PRN

## 2017-02-15 MED ORDER — RISAQUAD PO CAPS
1.0000 | ORAL_CAPSULE | Freq: Every day | ORAL | Status: DC
  Administered 2017-02-15 – 2017-02-16 (×2): 1 via ORAL
  Filled 2017-02-15 (×2): qty 1

## 2017-02-15 MED ORDER — ONDANSETRON HCL 4 MG/2ML IJ SOLN
INTRAMUSCULAR | Status: DC | PRN
  Administered 2017-02-15: 4 mg via INTRAVENOUS

## 2017-02-15 MED ORDER — POTASSIUM CHLORIDE ER 10 MEQ PO TBCR
10.0000 meq | EXTENDED_RELEASE_TABLET | Freq: Every day | ORAL | Status: DC
  Administered 2017-02-15 – 2017-02-16 (×2): 10 meq via ORAL
  Filled 2017-02-15 (×4): qty 1

## 2017-02-15 MED ORDER — HYDROMORPHONE HCL 1 MG/ML IJ SOLN
INTRAMUSCULAR | Status: AC
  Filled 2017-02-15: qty 2

## 2017-02-15 MED ORDER — METHOCARBAMOL 1000 MG/10ML IJ SOLN
500.0000 mg | Freq: Four times a day (QID) | INTRAMUSCULAR | Status: DC | PRN
  Administered 2017-02-15: 500 mg via INTRAVENOUS
  Filled 2017-02-15: qty 550

## 2017-02-15 MED ORDER — DOCUSATE SODIUM 100 MG PO CAPS
100.0000 mg | ORAL_CAPSULE | Freq: Two times a day (BID) | ORAL | Status: DC
  Administered 2017-02-15 – 2017-02-16 (×2): 100 mg via ORAL
  Filled 2017-02-15 (×2): qty 1

## 2017-02-15 MED ORDER — INSULIN ASPART 100 UNIT/ML ~~LOC~~ SOLN
0.0000 [IU] | Freq: Three times a day (TID) | SUBCUTANEOUS | Status: DC
  Administered 2017-02-15: 3 [IU] via SUBCUTANEOUS

## 2017-02-15 MED ORDER — ONDANSETRON HCL 4 MG PO TABS: 4.0000 mg | ORAL_TABLET | Freq: Four times a day (QID) | ORAL | Status: DC | PRN

## 2017-02-15 MED ORDER — MENTHOL 3 MG MT LOZG: 1.0000 | LOZENGE | OROMUCOSAL | Status: DC | PRN

## 2017-02-15 MED ORDER — CEFAZOLIN SODIUM-DEXTROSE 2-4 GM/100ML-% IV SOLN
2.0000 g | Freq: Three times a day (TID) | INTRAVENOUS | Status: AC
  Administered 2017-02-15 – 2017-02-16 (×3): 2 g via INTRAVENOUS
  Filled 2017-02-15 (×3): qty 100

## 2017-02-15 MED ORDER — ALBUTEROL SULFATE (2.5 MG/3ML) 0.083% IN NEBU: 2.5000 mg | INHALATION_SOLUTION | Freq: Four times a day (QID) | RESPIRATORY_TRACT | Status: DC | PRN

## 2017-02-15 MED ORDER — PROPOFOL 10 MG/ML IV BOLUS
INTRAVENOUS | Status: DC | PRN
  Administered 2017-02-15: 150 mg via INTRAVENOUS

## 2017-02-15 SURGICAL SUPPLY — 50 items
BAG ZIPLOCK 12X15 (MISCELLANEOUS) IMPLANT
CLEANER TIP ELECTROSURG 2X2 (MISCELLANEOUS) ×2 IMPLANT
CLOTH 2% CHLOROHEXIDINE 3PK (PERSONAL CARE ITEMS) ×2 IMPLANT
COVER SURGICAL LIGHT HANDLE (MISCELLANEOUS) ×2 IMPLANT
DRAPE MICROSCOPE LEICA (MISCELLANEOUS) ×2 IMPLANT
DRAPE POUCH INSTRU U-SHP 10X18 (DRAPES) ×2 IMPLANT
DRAPE SHEET LG 3/4 BI-LAMINATE (DRAPES) ×2 IMPLANT
DRAPE SURG 17X11 SM STRL (DRAPES) ×2 IMPLANT
DRAPE UTILITY XL STRL (DRAPES) ×2 IMPLANT
DRSG AQUACEL AG ADV 3.5X 4 (GAUZE/BANDAGES/DRESSINGS) IMPLANT
DRSG AQUACEL AG ADV 3.5X 6 (GAUZE/BANDAGES/DRESSINGS) ×2 IMPLANT
DURAPREP 26ML APPLICATOR (WOUND CARE) ×2 IMPLANT
DURASEAL SPINE SEALANT 3ML (MISCELLANEOUS) IMPLANT
ELECT BLADE TIP CTD 4 INCH (ELECTRODE) ×2 IMPLANT
ELECT REM PT RETURN 15FT ADLT (MISCELLANEOUS) ×2 IMPLANT
GLOVE BIOGEL PI IND STRL 7.0 (GLOVE) ×1 IMPLANT
GLOVE BIOGEL PI INDICATOR 7.0 (GLOVE) ×1
GLOVE SURG SS PI 7.0 STRL IVOR (GLOVE) ×2 IMPLANT
GLOVE SURG SS PI 7.5 STRL IVOR (GLOVE) IMPLANT
GLOVE SURG SS PI 8.0 STRL IVOR (GLOVE) ×4 IMPLANT
GOWN STRL REUS W/TWL XL LVL3 (GOWN DISPOSABLE) ×4 IMPLANT
HEMOSTAT SPONGE AVITENE ULTRA (HEMOSTASIS) IMPLANT
IV CATH 14GX2 1/4 (CATHETERS) ×2 IMPLANT
KIT BASIN OR (CUSTOM PROCEDURE TRAY) ×2 IMPLANT
KIT POSITIONING SURG ANDREWS (MISCELLANEOUS) ×2 IMPLANT
MANIFOLD NEPTUNE II (INSTRUMENTS) ×2 IMPLANT
NEEDLE HYPO 22GX1.5 SAFETY (NEEDLE) ×2 IMPLANT
NEEDLE SPNL 18GX3.5 QUINCKE PK (NEEDLE) ×6 IMPLANT
PACK LAMINECTOMY ORTHO (CUSTOM PROCEDURE TRAY) ×2 IMPLANT
PAD TELFA 2X3 NADH STRL (GAUZE/BANDAGES/DRESSINGS) ×2 IMPLANT
PATTIES SURGICAL .5 X.5 (GAUZE/BANDAGES/DRESSINGS) ×2 IMPLANT
PATTIES SURGICAL .75X.75 (GAUZE/BANDAGES/DRESSINGS) IMPLANT
PATTIES SURGICAL 1X1 (DISPOSABLE) IMPLANT
RUBBERBAND STERILE (MISCELLANEOUS) ×4 IMPLANT
SPONGE SURGIFOAM ABS GEL 100 (HEMOSTASIS) ×2 IMPLANT
STAPLER VISISTAT (STAPLE) IMPLANT
STRIP CLOSURE SKIN 1/2X4 (GAUZE/BANDAGES/DRESSINGS) ×2 IMPLANT
SUT NURALON 4 0 TR CR/8 (SUTURE) IMPLANT
SUT PROLENE 3 0 PS 2 (SUTURE) ×2 IMPLANT
SUT VIC AB 1 CT1 27 (SUTURE) ×1
SUT VIC AB 1 CT1 27XBRD ANTBC (SUTURE) ×1 IMPLANT
SUT VIC AB 1-0 CT2 27 (SUTURE) ×2 IMPLANT
SUT VIC AB 2-0 CT1 27 (SUTURE)
SUT VIC AB 2-0 CT1 TAPERPNT 27 (SUTURE) IMPLANT
SUT VIC AB 2-0 CT2 27 (SUTURE) ×2 IMPLANT
SYR 3ML LL SCALE MARK (SYRINGE) ×2 IMPLANT
SYR CONTROL 10ML LL (SYRINGE) ×2 IMPLANT
TOWEL OR 17X26 10 PK STRL BLUE (TOWEL DISPOSABLE) ×2 IMPLANT
TOWEL OR NON WOVEN STRL DISP B (DISPOSABLE) ×2 IMPLANT
YANKAUER SUCT BULB TIP NO VENT (SUCTIONS) ×2 IMPLANT

## 2017-02-15 NOTE — Transfer of Care (Signed)
Immediate Anesthesia Transfer of Care Note  Patient: [name]  Procedure(s) Performed: Microlumbar decompression L2-3, microdiscectomy L2-L3 (Left )  Patient Location: PACU  Anesthesia Type:General  Level of Consciousness: drowsy and patient cooperative  Airway & Oxygen Therapy: Patient Spontanous Breathing and Patient connected to face mask oxygen  Post-op Assessment: Report given to RN and Post -op Vital signs reviewed and stable  Post vital signs: Reviewed and stable  Last Vitals:  Vitals:   02/15/17 0851  BP: (!) 151/88  Pulse: 79  Resp: 16  Temp: 36.7 C  SpO2: 100%    Last Pain:  Vitals:   02/15/17 1005  TempSrc:   PainSc: (P) 4       Patients Stated Pain Goal: (P) 4 (57/84/69 6295)  Complications: No apparent anesthesia complications

## 2017-02-15 NOTE — Evaluation (Signed)
Occupational Therapy Evaluation Patient Details Name: [name] MRN: [id number] DOB: 14-Jun-1960 Today's Date: 02/15/2017    History of Present Illness This 56 y.o. female admitted for L2-3 microlumbar decompression and microdiscectomy.  PMH includes:  PTSD, PNA, h/o fainting spells of unknown cause, DM    Clinical Impression   Pt admitted with above. She demonstrates the below listed deficits and will benefit from continued OT to maximize safety and independence with BADLs.  Pt currently requires min A for ADLs.  She lives alone, but daughter will be with her until Sunday.   Anticipate good progress.  Will follow acutely.  She may need HHOT to ensure safety with IADLs in home environment since she will not have caregiver assist when daughter leaves for college.       Follow Up Recommendations  Supervision/Assistance - 24 hour;Home health OT    Equipment Recommendations  3 in 1 bedside commode    Recommendations for Other Services       Precautions / Restrictions Precautions Precautions: Fall;Back Precaution Booklet Issued: Yes (comment) Precaution Comments: Pt provided with back hand out and was instructed in back precautions  Restrictions Weight Bearing Restrictions: No      Mobility Bed Mobility Overal bed mobility: Needs Assistance Bed Mobility: Sit to Supine       Sit to supine: Min assist   General bed mobility comments: Pt instructed in log rolling.  Requires assist to lift LEs onto bed   Transfers Overall transfer level: Needs assistance   Transfers: Sit to/from Stand;Stand Pivot Transfers Sit to Stand: Min assist Stand pivot transfers: Min assist       General transfer comment: assist to steady and to maneuver RW     Balance Overall balance assessment: Needs assistance Sitting-balance support: Feet supported Sitting balance-Leahy Scale: Good     Standing balance support: Single extremity supported;During functional activity Standing  balance-Leahy Scale: Poor Standing balance comment: requires UE support                            ADL either performed or assessed with clinical judgement   ADL Overall ADL's : Needs assistance/impaired Eating/Feeding: Independent   Grooming: Wash/dry hands;Oral care;Wash/dry face;Brushing hair;Min guard;Standing   Upper Body Bathing: Set up;Supervision/ safety;Sitting   Lower Body Bathing: Minimal assistance;Sit to/from stand   Upper Body Dressing : Set up;Supervision/safety;Sitting   Lower Body Dressing: Minimal assistance;Sit to/from stand   Toilet Transfer: Minimal assistance;Ambulation;Comfort height toilet;RW;Grab bars Toilet Transfer Details (indicate cue type and reason): verbal cues for safety, maneuvering walker, and hand placement  Toileting- Clothing Manipulation and Hygiene: Min guard;Sit to/from stand       Functional mobility during ADLs: Minimal assistance;Rolling walker General ADL Comments: Pt is able to cross ankles over knees.  She requires min A for balance      Vision         Perception     Praxis      Pertinent Vitals/Pain Pain Assessment: 0-10 Pain Score: 9  Pain Location: back  Pain Descriptors / Indicators: Operative site guarding Pain Intervention(s): Limited activity within patient's tolerance;Monitored during session;Premedicated before session;Repositioned     Hand Dominance Right   Extremity/Trunk Assessment Upper Extremity Assessment Upper Extremity Assessment: Defer to OT evaluation   Lower Extremity Assessment Lower Extremity Assessment: Defer to PT evaluation       Communication Communication Communication: No difficulties   Cognition Arousal/Alertness: Awake/alert Behavior During Therapy: Impulsive;WFL for tasks assessed/performed Overall  Cognitive Status: Within Functional Limits for tasks assessed                                     General Comments       Exercises     Shoulder  Instructions      Home Living Family/patient expects to be discharged to:: Private residence Living Arrangements: Children Available Help at Discharge: Family;Available 24 hours/day(until Sunday ) Type of Home: Other(Comment)(Townhouse ) Home Access: Stairs to enter Entrance Stairs-Number of Steps: Pt reports 7 steps at parking lot to sidewalk, then sidewalk, then 4-5 stairs, then sidewalk, and 3-4 stairs to enter  Entrance Stairs-Rails: Right;Left Home Layout: Two level Alternate Level Stairs-Number of Steps: full flight  Alternate Level Stairs-Rails: Right;Left Bathroom Shower/Tub: Tub/shower unit;Curtain   Bathroom Toilet: Handicapped height     Home Equipment: Environmental consultant - 2 wheels   Additional Comments: Daughter currently lives with pt, but will leaving for college on Sunday       Prior Functioning/Environment Level of Independence: Independent with assistive device(s)        Comments: Pt reports she was intermittently using RW PTA         OT Problem List: Decreased strength;Decreased activity tolerance;Impaired balance (sitting and/or standing);Decreased safety awareness;Decreased knowledge of use of DME or AE;Decreased knowledge of precautions;Pain      OT Treatment/Interventions: Self-care/ADL training;DME and/or AE instruction;Therapeutic activities;Patient/family education;Balance training    OT Goals(Current goals can be found in the care plan section) Acute Rehab OT Goals Patient Stated Goal: to feel better  OT Goal Formulation: With patient Time For Goal Achievement: 03/01/17 Potential to Achieve Goals: Good ADL Goals Pt Will Perform Grooming: with supervision;standing Pt Will Perform Upper Body Bathing: with supervision;sitting Pt Will Perform Lower Body Bathing: with supervision;sit to/from stand Pt Will Perform Upper Body Dressing: with set-up;with supervision;sitting Pt Will Perform Lower Body Dressing: with supervision;sit to/from stand Pt Will Transfer  to Toilet: with supervision;ambulating;regular height toilet;grab bars Pt Will Perform Toileting - Clothing Manipulation and hygiene: with supervision;sit to/from stand  OT Frequency: Min 2X/week   Barriers to D/C: Decreased caregiver support          Co-evaluation              AM-PAC PT "6 Clicks" Daily Activity     Outcome Measure Help from another person eating meals?: None Help from another person taking care of personal grooming?: A Little Help from another person toileting, which includes using toliet, bedpan, or urinal?: A Little Help from another person bathing (including washing, rinsing, drying)?: A Little Help from another person to put on and taking off regular upper body clothing?: A Little Help from another person to put on and taking off regular lower body clothing?: A Little 6 Click Score: 19   End of Session Equipment Utilized During Treatment: Rolling walker Nurse Communication: Mobility status  Activity Tolerance: Patient tolerated treatment well Patient left: in bed;with call bell/phone within reach  OT Visit Diagnosis: Unsteadiness on feet (R26.81);Pain Pain - part of body: (back )                Time: 7829-5621 OT Time Calculation (min): 26 min Charges:  OT General Charges $OT Visit: 1 Visit OT Evaluation $OT Eval Moderate Complexity: 1 Mod OT Treatments $Self Care/Home Management : 8-22 mins G-Codes:     [facility], OTR/L [phone]   Lucille Passy M 02/15/2017, 5:39  PM

## 2017-02-15 NOTE — Progress Notes (Signed)
Occupational Therapy Evaluation Addendum:    02/20/17 1700  OT G-codes **NOT FOR INPATIENT CLASS**  Functional Assessment Tool Used AM-PAC 6 Clicks Daily Activity  Functional Limitation Self care  Self Care Current Status (K7425) CK  Self Care Goal Status (Z5638) CI  Lucille Passy, OTR/L [phone]

## 2017-02-15 NOTE — Interval H&P Note (Signed)
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 After consideration of risks, benefits and other options for treatment, the patient has consented to  Procedure(s) with comments: Microlumbar decompression L2-3 (N/A) - 120 mins as a surgical intervention .  The patient's history has been reviewed, patient examined, no change in status, stable for surgery.  I have reviewed the patient's chart and labs.  Questions were answered to the patient's satisfaction.     Salman Wellen C

## 2017-02-15 NOTE — Progress Notes (Signed)
Give another 1mg  dilaudid, per Dr Conrad Nemacolin

## 2017-02-15 NOTE — Anesthesia Procedure Notes (Signed)
Procedure Name: Intubation Date/Time: 02/15/2017 10:59 AM Performed by: Anne Fu, CRNA Pre-anesthesia Checklist: Patient identified, Emergency Drugs available, Suction available and Patient being monitored Patient Re-evaluated:Patient Re-evaluated prior to induction Oxygen Delivery Method: Circle system utilized Preoxygenation: Pre-oxygenation with 100% oxygen Induction Type: IV induction Ventilation: Mask ventilation without difficulty Laryngoscope Size: Miller and 2 Grade View: Grade I Tube type: Oral Tube size: 7.0 mm Number of attempts: 1 Airway Equipment and Method: Stylet and Oral airway Placement Confirmation: ETT inserted through vocal cords under direct vision,  positive ETCO2 and breath sounds checked- equal and bilateral Secured at: 21 cm Tube secured with: Tape Dental Injury: Teeth and Oropharynx as per pre-operative assessment

## 2017-02-15 NOTE — Anesthesia Postprocedure Evaluation (Signed)
Anesthesia Post Note  Patient: [name]  Procedure(s) Performed: Microlumbar decompression L2-3, microdiscectomy L2-L3 (Left )     Patient location during evaluation: PACU Anesthesia Type: General Level of consciousness: awake and alert Pain management: pain level controlled Vital Signs Assessment: post-procedure vital signs reviewed and stable Respiratory status: spontaneous breathing, nonlabored ventilation, respiratory function stable and patient connected to nasal cannula oxygen Cardiovascular status: blood pressure returned to baseline and stable Postop Assessment: no apparent nausea or vomiting Anesthetic complications: no    Last Vitals:  Vitals:   02/15/17 1501 02/15/17 1558  BP: 100/64 (!) 102/55  Pulse: 99 83  Resp: 14 16  Temp: 36.6 C 36.4 C  SpO2:  100%    Last Pain:  Vitals:   02/15/17 1603  TempSrc:   PainSc: 9                  Lamis Behrmann DAVID

## 2017-02-15 NOTE — Discharge Instructions (Signed)

## 2017-02-15 NOTE — Brief Op Note (Signed)
02/15/2017  1:18 PM  PATIENT:  [name]  56 y.o. 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  PROCEDURE:  Procedure(s) with comments: Microlumbar decompression L2-3, microdiscectomy L2-L3 (Left) - 120 mins  SURGEON:  Surgeon(s) and Role:    Susa Day, MD - Primary  PHYSICIAN ASSISTANT:   ASSISTANTS: Bissell   ANESTHESIA:   general  EBL:   25cc   BLOOD ADMINISTERED:none  DRAINS: none   LOCAL MEDICATIONS USED:  MARCAINE     SPECIMEN:  Source of Specimen:  L23  DISPOSITION OF SPECIMEN:  PATHOLOGY  COUNTS:  YES  TOURNIQUET:  * No tourniquets in log *  DICTATION: .Other Dictation: Dictation Number [id number]  PLAN OF CARE: Admit for overnight observation  PATIENT DISPOSITION:  PACU - hemodynamically stable.   Delay start of Pharmacological VTE agent (>24hrs) due to surgical blood loss or risk of bleeding: yes

## 2017-02-15 NOTE — Anesthesia Preprocedure Evaluation (Signed)
Anesthesia Evaluation  Patient identified by MRN, date of birth, ID band Patient awake    Reviewed: Allergy & Precautions, NPO status , Patient's Chart, lab work & pertinent test results  History of Anesthesia Complications (+) PONV  Airway Mallampati: I  TM Distance: >3 FB Neck ROM: Full    Dental   Pulmonary Current Smoker,    Pulmonary exam normal        Cardiovascular hypertension, Pt. on medications Normal cardiovascular exam     Neuro/Psych Anxiety Depression    GI/Hepatic GERD  Medicated and Controlled,  Endo/Other  diabetes  Renal/GU      Musculoskeletal   Abdominal   Peds  Hematology   Anesthesia Other Findings   Reproductive/Obstetrics                             Anesthesia Physical Anesthesia Plan  ASA: II  Anesthesia Plan: General   Post-op Pain Management:    Induction: Intravenous  PONV Risk Score and Plan: 3 and Ondansetron, Dexamethasone and Midazolam  Airway Management Planned: Oral ETT  Additional Equipment:   Intra-op Plan:   Post-operative Plan: Extubation in OR  Informed Consent: I have reviewed the patients History and Physical, chart, labs and discussed the procedure including the risks, benefits and alternatives for the proposed anesthesia with the patient or authorized representative who has indicated his/her understanding and acceptance.     Plan Discussed with: CRNA and Surgeon  Anesthesia Plan Comments:         Anesthesia Quick Evaluation

## 2017-02-16 DIAGNOSIS — M5116 Intervertebral disc disorders with radiculopathy, lumbar region: Secondary | ICD-10-CM | POA: Diagnosis not present

## 2017-02-16 LAB — CBC
HEMATOCRIT: 28.4 % — AB (ref 36.0–46.0)
Hemoglobin: 9.9 g/dL — ABNORMAL LOW (ref 12.0–15.0)
MCH: 31.7 pg (ref 26.0–34.0)
MCHC: 34.9 g/dL (ref 30.0–36.0)
MCV: 91 fL (ref 78.0–100.0)
PLATELETS: 205 10*3/uL (ref 150–400)
RBC: 3.12 MIL/uL — ABNORMAL LOW (ref 3.87–5.11)
RDW: 14.3 % (ref 11.5–15.5)
WBC: 11.4 10*3/uL — ABNORMAL HIGH (ref 4.0–10.5)

## 2017-02-16 LAB — BASIC METABOLIC PANEL
ANION GAP: 6 (ref 5–15)
BUN: 13 mg/dL (ref 6–20)
CALCIUM: 9 mg/dL (ref 8.9–10.3)
CO2: 25 mmol/L (ref 22–32)
CREATININE: 0.81 mg/dL (ref 0.44–1.00)
Chloride: 108 mmol/L (ref 101–111)
GFR calc Af Amer: >60 mL/min (ref >60)
Glucose, Bld: 88 mg/dL (ref 65–99)
Potassium: 3.8 mmol/L (ref 3.5–5.1)
Sodium: 139 mmol/L (ref 135–145)

## 2017-02-16 LAB — GLUCOSE, CAPILLARY: Glucose-Capillary: 81 mg/dL (ref 65–99)

## 2017-02-16 MED ORDER — FERROUS SULFATE 325 (65 FE) MG PO TABS: 325.0000 mg | ORAL_TABLET | Freq: Two times a day (BID) | ORAL | 0 refills | Status: DC

## 2017-02-16 MED ORDER — METHOCARBAMOL 500 MG PO TABS: 500.0000 mg | ORAL_TABLET | Freq: Four times a day (QID) | ORAL | 0 refills | Status: DC | PRN

## 2017-02-16 NOTE — Evaluation (Signed)
Physical Therapy Evaluation Patient Details Name: [name] MRN: [id number] DOB: 1961-01-28 Today's Date: 02/16/2017   History of Present Illness  This 57 y.o. female admitted for L2-3 microlumbar decompression and microdiscectomy.  PMH includes:  PTSD, PNA, h/o fainting spells of unknown cause, DM   Clinical Impression  Patient is s/p above surgery resulting in the deficits listed below (see PT Problem List). Pt ambulated 200' with RW with supervision, demonstrates good understanding of back precautions, and initiated stair training. She is very anxious about managing at home alone after Sunday when her daughter returns to college. Pt's anxiety level was reduced after she ambulated and did well with stairs. Will review stairs one more time this morning for pt's confidence, then expect she'll be ready to DC home.  Patient will benefit from skilled PT to increase their independence and safety with mobility (while adhering to their precautions) to allow discharge to the venue listed below.     Follow Up Recommendations No PT follow up    Equipment Recommendations  3in1 (PT)    Recommendations for Other Services       Precautions / Restrictions Precautions Precautions: Fall;Back Precaution Booklet Issued: Yes (comment) Precaution Comments: reviewed back precautions Restrictions Weight Bearing Restrictions: No      Mobility  Bed Mobility Overal bed mobility: Needs Assistance Bed Mobility: Rolling;Sidelying to Sit Rolling: Supervision Sidelying to sit: Supervision       General bed mobility comments: VCs for log rolling technique  Transfers Overall transfer level: Needs assistance Equipment used: Rolling walker (2 wheeled) Transfers: Sit to/from Stand Sit to Stand: Supervision         General transfer comment: VCs hand placement  Ambulation/Gait Ambulation/Gait assistance: Supervision Ambulation Distance (Feet): 200 Feet Assistive device: Rolling walker (2  wheeled) Gait Pattern/deviations: Decreased stride length;Step-through pattern   Gait velocity interpretation: Below normal speed for age/gender General Gait Details: steady, no LOB  Stairs Stairs: Yes Stairs assistance: Supervision Stair Management: Two rails;Forwards;Step to pattern Number of Stairs: 3 General stair comments: VCs sequencing, no LOB  Wheelchair Mobility    Modified Rankin (Stroke Patients Only)       Balance Overall balance assessment: Needs assistance Sitting-balance support: Feet supported Sitting balance-Leahy Scale: Good     Standing balance support: During functional activity Standing balance-Leahy Scale: Fair                               Pertinent Vitals/Pain Pain Score: 10-Worst pain ever Pain Location: back  Pain Descriptors / Indicators: Aching Pain Intervention(s): Limited activity within patient's tolerance;Monitored during session;RN gave pain meds during session    [place] expects to be discharged to:: Private residence Living Arrangements: Children Available Help at Discharge: Family;Available 24 hours/day(until Sunday ) Type of Home: Other(Comment)(Townhouse ) Home Access: Stairs to enter Entrance Stairs-Rails: Right;Left Entrance Stairs-Number of Steps: Pt reports 7 steps at parking lot to sidewalk, then sidewalk, then 4-5 stairs, then sidewalk, and 3-4 stairs to enter  Home Layout: Two level Home Equipment: Environmental consultant - 2 wheels Additional Comments: Daughter currently lives with pt, but will leaving for college on Sunday     Prior Function Level of Independence: Independent with assistive device(s)         Comments: Pt reports she was intermittently using RW PTA, doesn't drive uses a cab for transportation     Hand Dominance   Dominant Hand: Right    Extremity/Trunk Assessment   Upper  Extremity Assessment Upper Extremity Assessment: Defer to OT evaluation    Lower Extremity  Assessment Lower Extremity Assessment: LLE deficits/detail LLE Deficits / Details: knee ext -3/5, knee ext -25* AAROM limited by pain, numb to light touch L anterior thigh (this was pre-existing)       Communication   Communication: No difficulties  Cognition Arousal/Alertness: Awake/alert Behavior During Therapy: WFL for tasks assessed/performed;Anxious Overall Cognitive Status: Within Functional Limits for tasks assessed                                 General Comments: pt teary, worried about being able to manage at home alone after her daughter leaves for college Sunday, stated a neighbor can assist      General Comments      Exercises     Assessment/Plan    PT Assessment Patient needs continued PT services  PT Problem List Decreased strength;Decreased range of motion;Decreased activity tolerance;Pain       PT Treatment Interventions DME instruction;Gait training;Therapeutic activities;Functional mobility training;Stair training;Therapeutic exercise;Patient/family education    PT Goals (Current goals can be found in the Care Plan section)  Acute Rehab PT Goals Patient Stated Goal: "to be able to clean, I have OCD" PT Goal Formulation: With patient Time For Goal Achievement: 02/18/17 Potential to Achieve Goals: Good    Frequency 7X/week   Barriers to discharge Decreased caregiver support will be home alone after Sunday, neighber can assist some    Co-evaluation               AM-PAC PT "6 Clicks" Daily Activity  Outcome Measure Difficulty turning over in bed (including adjusting bedclothes, sheets and blankets)?: A Little Difficulty moving from lying on back to sitting on the side of the bed? : A Little Difficulty sitting down on and standing up from a chair with arms (e.g., wheelchair, bedside commode, etc,.)?: A Little Help needed moving to and from a bed to chair (including a wheelchair)?: A Little Help needed walking in hospital room?: A  Little Help needed climbing 3-5 steps with a railing? : A Little 6 Click Score: 18    End of Session Equipment Utilized During Treatment: Gait belt Activity Tolerance: Patient tolerated treatment well Patient left: in chair;with call bell/phone within reach Nurse Communication: Mobility status PT Visit Diagnosis: Difficulty in walking, not elsewhere classified (R26.2);Muscle weakness (generalized) (M62.81)    Time: 0240-9735 PT Time Calculation (min) (ACUTE ONLY): 39 min   Charges:   PT Evaluation $PT Eval Low Complexity: 1 Low PT Treatments $Gait Training: 8-22 mins $Therapeutic Activity: 8-22 mins   PT G Codes:   PT G-Codes **NOT FOR INPATIENT CLASS** Functional Assessment Tool Used: AM-PAC 6 Clicks Basic Mobility Functional Limitation: Mobility: Walking and moving around Mobility: Walking and Moving Around Current Status (H2992): At least 40 percent but less than 60 percent impaired, limited or restricted Mobility: Walking and Moving Around Goal Status [phone]): At least 20 percent but less than 40 percent impaired, limited or restricted      Philomena Doheny 02/16/2017, 8:54 AM [phone]

## 2017-02-16 NOTE — Care Management Note (Signed)
Case Management Note  Patient Details  Name: [name] MRN: [id number] Date of Birth: September 24, 1960  Subjective/Objective:                   L2-3 microlumbar decompression and microdiscectomy.  Action/Plan: CM spoke with the patient at the bedside. Patient states she will have the assistance of her daughter at home until Sunday. Patient states she has no difficulty getting to her physician appointments or being able to afford her medications. Patient has a 3N1 in the room provided to her by her assigned RN per the patient. DME is from [facility]. She also has a grabber she received from PT.   Expected Discharge Date:  02/16/17               Expected Discharge Plan:  Home/Self Care  In-House Referral:     Discharge planning Services  CM Consult  Post Acute Care Choice:    Choice offered to:     DME Arranged:    DME Agency:     HH Arranged:  NA HH Agency:  NA  Status of Service:  Completed, signed off  If discussed at [place] of Stay Meetings, dates discussed:    Additional Comments:  Apolonio Schneiders, RN 02/16/2017, 11:13 AM

## 2017-02-16 NOTE — Progress Notes (Signed)
Subjective: 1 Day Post-Op Procedure(s) (LRB): Microlumbar decompression L2-3, microdiscectomy L2-L3 (Left) Patient reports pain as 1 on 0-10 scale.Doing very well today. Dressing changed and minimal blood on dressing. No active bleeding.    Objective: Vital signs in last 24 hours: Temp:  [97.4 F (36.3 C)-98.6 F (37 C)] 98.6 F (37 C) (01/01 0530) Pulse Rate:  [67-104] 74 (01/01 0530) Resp:  [12-25] 17 (01/01 0530) BP: (97-120)/(50-83) 109/68 (01/01 0530) SpO2:  [98 %-100 %] 98 % (01/01 0530)  Intake/Output from previous day: 12/31 0701 - 01/01 0700 In: 2537.5 [P.O.:460; I.V.:1922.5; IV Piggyback:155] Out: 1790 [Urine:1750; Blood:40] Intake/Output this shift: Total I/O In: -  Out: 200 [Urine:200]  Recent Labs    02/16/17 0554  HGB 9.9*   Recent Labs    02/16/17 0554  WBC 11.4*  RBC 3.12*  HCT 28.4*  PLT 205   Recent Labs    02/16/17 0554  NA 139  K 3.8  CL 108  CO2 25  BUN 13  CREATININE 0.81  GLUCOSE 88  CALCIUM 9.0   No results for input(s): LABPT, INR in the last 72 hours.  Neurovascular intact No cellulitis present  Assessment/Plan: 1 Day Post-Op Procedure(s) (LRB): Microlumbar decompression L2-3, microdiscectomy L2-L3 (Left) Up with therapy,then DC  Latanya Maudlin 02/16/2017, 9:14 AM

## 2017-02-16 NOTE — Progress Notes (Signed)
Physical Therapy Treatment Patient Details Name: [name] MRN: [id number] DOB: [birth year] Today's Date: 02/16/2017    History of Present Illness This 57 y.o. female admitted for L2-3 microlumbar decompression and microdiscectomy.  PMH includes:  PTSD, PNA, h/o fainting spells of unknown cause, DM     PT Comments    Pt independent with bed mobility and ambulating 200' with RW, reviewed stairs per pt request, she had no difficulty. She demonstrates good understanding of back precautions and is ready to DC home from PT standpoint.    Follow Up Recommendations  No PT follow up     Equipment Recommendations  3in1 (PT)    Recommendations for Other Services       Precautions / Restrictions Precautions Precautions: Fall;Back Precaution Booklet Issued: Yes (comment) Precaution Comments: reviewed back precautions Restrictions Weight Bearing Restrictions: No    Mobility  Bed Mobility Overal bed mobility: Independent Bed Mobility: Rolling;Sidelying to Sit Rolling: Independent Sidelying to sit: Independent       General bed mobility comments: good log roll technique, no assist needed  Transfers Overall transfer level: Needs assistance Equipment used: Rolling walker (2 wheeled) Transfers: Sit to/from Stand Sit to Stand: Supervision         General transfer comment: VCs hand placement  Ambulation/Gait Ambulation/Gait assistance: Modified independent (Device/Increase time) Ambulation Distance (Feet): 200 Feet Assistive device: Rolling walker (2 wheeled) Gait Pattern/deviations: Decreased stride length;Step-through pattern   Gait velocity interpretation: Below normal speed for age/gender General Gait Details: steady, no LOB   Stairs Stairs: Yes   Stair Management: Two rails;Forwards;Step to pattern Number of Stairs: 6 General stair comments: good sequencing, no LOB  Wheelchair Mobility    Modified Rankin (Stroke Patients Only)       Balance  Overall balance assessment: Needs assistance Sitting-balance support: Feet supported Sitting balance-Leahy Scale: Good     Standing balance support: During functional activity Standing balance-Leahy Scale: Fair Standing balance comment: can release walker in static standing at sink                            Cognition Arousal/Alertness: Awake/alert Behavior During Therapy: [facility] for tasks assessed/performed;Anxious Overall Cognitive Status: Within Functional Limits for tasks assessed                                 General Comments: pt teary, worried about being able to manage at home alone after her daughter leaves for college Sunday, stated a neighbor can assist      Exercises      General Comments        Pertinent Vitals/Pain Pain Assessment: Faces Pain Score: 6  Faces Pain Scale: Hurts even more Pain Location: back  Pain Descriptors / Indicators: Sore Pain Intervention(s): Limited activity within patient's tolerance;Monitored during session;Premedicated before session    Home Living Family/patient expects to be discharged to:: Private residence Living Arrangements: Children Available Help at Discharge: Family;Available 24 hours/day(until Sunday ) Type of Home: Other(Comment)(Townhouse ) Home Access: Stairs to enter Entrance Stairs-Rails: Right;Left Home Layout: Two level Home Equipment: Shallowater - 2 wheels Additional Comments: Daughter currently lives with pt, but will leaving for college on Sunday     Prior Function Level of Independence: Independent with assistive device(s)      Comments: Pt reports she was intermittently using RW PTA, doesn't drive uses a cab for transportation   PT Goals (current goals  can now be found in the care plan section) Acute Rehab PT Goals Patient Stated Goal: "to be able to clean, I have OCD" PT Goal Formulation: With patient Time For Goal Achievement: 02/18/17 Potential to Achieve Goals: Good Progress  towards PT goals: Goals met/education completed, patient discharged from PT    Frequency    7X/week      PT Plan Current plan remains appropriate    Co-evaluation              AM-PAC PT "6 Clicks" Daily Activity  Outcome Measure  Difficulty turning over in bed (including adjusting bedclothes, sheets and blankets)?: None Difficulty moving from lying on back to sitting on the side of the bed? : None Difficulty sitting down on and standing up from a chair with arms (e.g., wheelchair, bedside commode, etc,.)?: None Help needed moving to and from a bed to chair (including a wheelchair)?: None Help needed walking in hospital room?: None Help needed climbing 3-5 steps with a railing? : None 6 Click Score: 24    End of Session Equipment Utilized During Treatment: Gait belt Activity Tolerance: Patient tolerated treatment well Patient left: in chair;with call bell/phone within reach Nurse Communication: Mobility status PT Visit Diagnosis: Difficulty in walking, not elsewhere classified (R26.2);Muscle weakness (generalized) (M62.81)     Time: 4431-5400 PT Time Calculation (min) (ACUTE ONLY): 13 min  Charges:  $Gait Training: 8-22 mins $Therapeutic Activity: 8-22 mins                    G Codes:  Functional Assessment Tool Used: AM-PAC 6 Clicks Basic Mobility Functional Limitation: Mobility: Walking and moving around Mobility: Walking and Moving Around Current Status (Q6761): At least 40 percent but less than 60 percent impaired, limited or restricted Mobility: Walking and Moving Around Goal Status [phone]): At least 20 percent but less than 40 percent impaired, limited or restricted       Philomena Doheny 02/16/2017, 10:55 AM [phone]

## 2017-02-16 NOTE — Progress Notes (Signed)
Occupational Therapy Treatment Patient Details Name: [name] MRN: [id number] DOB: 07/29/1960 Today's Date: 02/16/2017    History of present illness This 57 y.o. female admitted for L2-3 microlumbar decompression and microdiscectomy.  PMH includes:  PTSD, PNA, h/o fainting spells of unknown cause, DM    OT comments  Pt is performing ADL and ADL transfers with supervision. Instructed at length in how to generalize back precautions in ADL and IADL and multiple uses of 3 in 1. Pt issued reacher. Pt progressing well.  Follow Up Recommendations  Supervision/Assistance - 24 hour;Home health OT    Equipment Recommendations  3 in 1 bedside commode    Recommendations for Other Services      Precautions / Restrictions Precautions Precautions: Fall;Back Precaution Booklet Issued: Yes (comment) Precaution Comments: reviewed back precautions Restrictions Weight Bearing Restrictions: No       Mobility Bed Mobility        General bed mobility comments: pt in chair  Transfers Overall transfer level: Needs assistance Equipment used: Rolling walker (2 wheeled) Transfers: Sit to/from Stand Sit to Stand: Supervision         General transfer comment: VCs hand placement    Balance Overall balance assessment: Needs assistance Sitting-balance support: Feet supported Sitting balance-Leahy Scale: Good     Standing balance support: During functional activity Standing balance-Leahy Scale: Fair Standing balance comment: can release walker in static standing at sink                           ADL either performed or assessed with clinical judgement   ADL Overall ADL's : Needs assistance/impaired Eating/Feeding: Independent   Grooming: Oral care;Standing;Supervision/safety Grooming Details (indicate cue type and reason): instructed in two cup method     Lower Body Bathing: Supervison/ safety;Sit to/from stand Lower Body Bathing Details (indicate cue type and  reason): recommended long handled bath sponge, educated to avoid twist when washing back Upper Body Dressing : Set up;Sitting   Lower Body Dressing: Supervision/safety;Sit to/from stand   Toilet Transfer: Supervision/safety;Ambulation;RW Toilet Transfer Details (indicate cue type and reason): educated in use of 3 in 1 over toilet as needed Toileting- Water quality scientist and Hygiene: Supervision/safety;Sit to/from stand Toileting - Clothing Manipulation Details (indicate cue type and reason): educated to avoid twisting with pericare Tub/ Shower Transfer: Supervision/safety;3 in Chartered certified accountant Details (indicate cue type and reason): instructed in technique and how to set up 3 in1 in tub Functional mobility during ADLs: Supervision/safety;Rolling walker General ADL Comments: Eductated in how to transport items safely with RW and to always have hands free on stairs, educated in safe footwear and use of reacher, pt has a friend to help with mopping, laundry, groceries, and making beds     Vision       Perception     Praxis      Cognition Arousal/Alertness: Awake/alert Behavior During Therapy: [facility] for tasks assessed/performed;Anxious Overall Cognitive Status: Within Functional Limits for tasks assessed                                 General Comments: pt teary, worried about being able to manage at home alone after her daughter leaves for college Sunday, stated a neighbor can assist        Exercises     Shoulder Instructions       General Comments      Pertinent Vitals/ Pain  Pain Assessment: Faces Pain Score: 10-Worst pain ever Faces Pain Scale: Hurts even more Pain Location: back  Pain Descriptors / Indicators: Aching Pain Intervention(s): Repositioned;Monitored during session  Home Living Family/patient expects to be discharged to:: Private residence Living Arrangements: Children Available Help at Discharge:  Family;Available 24 hours/day(until Sunday ) Type of Home: Other(Comment)(Townhouse ) Home Access: Stairs to enter Entrance Stairs-Number of Steps: Pt reports 7 steps at parking lot to sidewalk, then sidewalk, then 4-5 stairs, then sidewalk, and 3-4 stairs to enter  Entrance Stairs-Rails: Right;Left Home Layout: Two level Alternate Level Stairs-Number of Steps: full flight  Alternate Level Stairs-Rails: Right;Left Bathroom Shower/Tub: Tub/shower unit;Curtain   Bathroom Toilet: Handicapped height     Home Equipment: Environmental consultant - 2 wheels   Additional Comments: Daughter currently lives with pt, but will leaving for college on Sunday       Prior Functioning/Environment Level of Independence: Independent with assistive device(s)        Comments: Pt reports she was intermittently using RW PTA, doesn't drive uses a cab for transportation   Frequency  Min 2X/week        Progress Toward Goals  OT Goals(current goals can now be found in the care plan section)  Progress towards OT goals: Progressing toward goals  Acute Rehab OT Goals Patient Stated Goal: "to be able to clean, I have OCD" OT Goal Formulation: With patient Time For Goal Achievement: 03/01/17 Potential to Achieve Goals: Good  Plan Discharge plan remains appropriate    Co-evaluation                 AM-PAC PT "6 Clicks" Daily Activity     Outcome Measure   Help from another person eating meals?: None Help from another person taking care of personal grooming?: A Little Help from another person toileting, which includes using toliet, bedpan, or urinal?: A Little Help from another person bathing (including washing, rinsing, drying)?: A Little Help from another person to put on and taking off regular upper body clothing?: None Help from another person to put on and taking off regular lower body clothing?: A Little 6 Click Score: 20    End of Session Equipment Utilized During Treatment: Rolling walker  OT  Visit Diagnosis: Unsteadiness on feet (R26.81);Pain   Activity Tolerance Patient tolerated treatment well   Patient Left in chair;with call bell/phone within reach;with nursing/sitter in room   Nurse Communication          Time: [phone] OT Time Calculation (min): 25 min  Charges: OT General Charges $OT Visit: 1 Visit OT Treatments $Self Care/Home Management : 23-37 mins  02/16/2017 Nestor Lewandowsky, OTR/L Pager: [phone]   Werner Lean Haze Boyden 02/16/2017, 9:35 AM

## 2017-02-16 NOTE — Plan of Care (Signed)
Plan of care reviewed with patient, concerns addressed, progression and back precautions discussed.

## 2017-02-17 NOTE — Discharge Summary (Signed)
Physician Discharge Summary   Patient ID: [name] MRN: [id number] DOB/AGE: [birth year] 57 y.o.  Admit date: 02/15/2017 Discharge date: 02/17/2017  Primary Diagnosis:   HNP L2-3  Admission Diagnoses:  Past Medical History:  Diagnosis Date  . Anginal pain ([place])    hospitalized for chest wall strain  2 years; havent felt anything like that pain since   . Depression   . Diabetes mellitus without complication ([place])   . Diverticulitis   . Eating disorder   . Fluttering heart    per patient hx  . Frequent headaches   . GERD (gastroesophageal reflux disease)   . 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 History of fainting spells of unknown cause   . Hyperlipidemia   . Hypertension   . Pneumonia 2016   and bronchitis   . PONV (postoperative nausea and vomiting)   . PTSD (post-traumatic stress disorder)   . Renal disorder 2017   hospitalized for PNA and bronchitis, abx given caused ancute kidney injury ; resolved before D/C  . Vitamin D deficiency 01/30/2017   Discharge Diagnoses:   Principal Problem:   HNP (herniated nucleus pulposus), lumbar  Procedure:  Procedure(s) (LRB): Microlumbar decompression L2-3, microdiscectomy L2-L3 (Left)   Consults: None  HPI:  See H&P    Laboratory Data: Hospital Outpatient Visit on 02/10/2017  Component Date Value Ref Range Status  . Glucose-Capillary 02/10/2017 131* 65 - 99 mg/dL Final  . Preg, Serum 02/10/2017 NEGATIVE  NEGATIVE Final   Comment:        THE SENSITIVITY OF THIS METHODOLOGY IS >10 mIU/mL.   Marland Kitchen Sodium 02/10/2017 140  135 - 145 mmol/L Final  . Potassium 02/10/2017 4.2  3.5 - 5.1 mmol/L Final  . Chloride 02/10/2017 110  101 - 111 mmol/L Final  . CO2 02/10/2017 24  22 - 32 mmol/L Final  . Glucose, Bld 02/10/2017 116* 65 - 99 mg/dL Final  . BUN 02/10/2017 22* 6 - 20 mg/dL Final  . Creatinine, Ser 02/10/2017 0.90  0.44 -  1.00 mg/dL Final  . Calcium 02/10/2017 9.6  8.9 - 10.3 mg/dL Final  . GFR calc non Af Amer 02/10/2017 >60  >60 mL/min Final  . GFR calc Af Amer 02/10/2017 >60  >60 mL/min Final   Comment: (NOTE) The eGFR has been calculated using the CKD EPI equation. This calculation has not been validated in all clinical situations. eGFR's persistently <60 mL/min signify possible Chronic Kidney Disease.   . Anion gap 02/10/2017 6  5 - 15 Final  . WBC 02/10/2017 7.5  4.0 - 10.5 K/uL Final  . RBC 02/10/2017 3.99  3.87 - 5.11 MIL/uL Final  . Hemoglobin 02/10/2017 12.4  12.0 - 15.0 g/dL Final  . HCT 02/10/2017 36.8  36.0 - 46.0 % Final  . MCV 02/10/2017 92.2  78.0 - 100.0 fL Final  . MCH 02/10/2017 31.1  26.0 - 34.0 pg Final  . MCHC 02/10/2017 33.7  30.0 - 36.0 g/dL Final  . RDW 02/10/2017 14.3  11.5 - 15.5 % Final  . Platelets 02/10/2017 286  150 - 400 K/uL Final  . MRSA, PCR 02/10/2017 NEGATIVE  NEGATIVE Final  . Staphylococcus aureus 02/10/2017 NEGATIVE  NEGATIVE Final   Comment: (NOTE) The Xpert SA Assay (FDA approved for NASAL specimens in patients 75 years of age and older), is one component of a comprehensive surveillance  program. It is not intended to diagnose infection nor to guide or monitor treatment.    Recent Labs    02/16/17 0554  HGB 9.9*   Recent Labs    02/16/17 0554  WBC 11.4*  RBC 3.12*  HCT 28.4*  PLT 205   Recent Labs    02/16/17 0554  NA 139  K 3.8  CL 108  CO2 25  BUN 13  CREATININE 0.81  GLUCOSE 88  CALCIUM 9.0   No results for input(s): LABPT, INR in the last 72 hours.  X-Rays:Dg Lumbar Spine 2-3 Views  Result Date: 02/10/2017 CLINICAL DATA:  Preoperative examination for lumbar surgery. Please provide spinal labeling. EXAM: LUMBAR SPINE - 2-3 VIEW COMPARISON:  Lumbar spine CT - 03/05/2016 FINDINGS: There are 5 non rib-bearing lumbar type vertebral bodies. There is a mild scoliotic curvature of the thoracolumbar spine with dominant caudal component  convex the left measuring approximately 18 degrees (as measured from the superior endplate of L1 to the inferior endplate of L5). There is straightening of the expected lumbar lordosis. No anterolisthesis or retrolisthesis. Lumbar vertebral body heights appear preserved. Mild to moderate multilevel lumbar spine DDD, worse at L3-L4, with disc space height loss, endplate irregularity and sclerosis. Limited visualization of the bilateral SI joints is normal. Surgical clips overlie the right hemipelvis. Calcified atherosclerotic plaque within the abdominal aorta. IMPRESSION: 1. Mild-to-moderate multilevel lumbar spine DDD, worse at L3-L4. 2.  Aortic Atherosclerosis (ICD10-I70.0). Electronically Signed   By: Sandi Mariscal M.D.   On: 02/10/2017 11:26   Dg Spine Portable 1 View  Result Date: 02/15/2017 CLINICAL DATA:  Lumbar laminectomy, please label intervertebral spaces for Dr. Tonita Cong EXAM: PORTABLE SPINE - 1 VIEW COMPARISON:  Earlier today at 1125 hours. FINDINGS: 1230 hours. Single lateral view. Aortic atherosclerosis. Surgical devices project posterior to the L2 vertebral body. Degenerate disc disease involves L3-4. IMPRESSION: Intraoperative localization of L2. Electronically Signed   By: Abigail Miyamoto M.D.   On: 02/15/2017 12:49   Dg Spine Portable 1 View  Result Date: 02/15/2017 CLINICAL DATA:  Lumbar laminectomy. EXAM: PORTABLE SPINE - 1 VIEW COMPARISON:  02/15/2017, earlier the same day. FINDINGS: Cross-table lateral view lumbar spine obtained at 1125 hours shows soft tissue retractors posteriorly in the lower back, positioned at approximately the level of the L2 spinous process. Loss of disc height noted at L3-4. Atherosclerotic calcification is evident in the abdominal aorta. IMPRESSION: Intraoperative localization. Electronically Signed   By: Misty Stanley M.D.   On: 02/15/2017 11:53   Dg Spine Portable 1 View  Result Date: 02/15/2017 CLINICAL DATA:  Intraoperative localization radiograph prior  lumbar laminectomy. EXAM: PORTABLE SPINE - 1 VIEW COMPARISON:  Lumbar spine series of February 10, 2017 FINDINGS: The metallic needles project just posteriorly to the spaces between the and L3 and L3 and L4 spinous processes. There is moderate disc space narrowing at L3-4. There is calcification in the wall of the abdominal aorta. IMPRESSION: The metallic localization needles project just posterior to the spaces between the L2 and L3 spinous processes and between the L3 and L4 she spinous processes. Electronically Signed   By: David  Martinique M.D.   On: 02/15/2017 11:32   Dg Hip Unilat W Or Wo Pelvis 2-3 Views Left  Result Date: 01/21/2017 CLINICAL DATA:  Left buttock and hip pain, 2 weeks duration. No known injury. EXAM: DG HIP (WITH OR WITHOUT PELVIS) 2-3V LEFT COMPARISON:  None. FINDINGS: Both hip joints appear normal. No joint space narrowing. Mild sacroiliac osteoarthritis.  No other bone or joint finding in the pelvis or hips. IMPRESSION: Negative appearance of the left hip. 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 Electronically Signed   By: Nelson Chimes M.D.   On: 01/21/2017 13:43    EKG: Orders placed or performed during the hospital encounter of 03/05/16  . EKG 12-Lead  . EKG 12-Lead  . EKG     Hospital Course: Patient was admitted to [facility] and taken to the OR and underwent the above state procedure without complications.  Patient tolerated the procedure well and was later transferred to the recovery room and then to the orthopaedic floor for postoperative care.  They were given PO and IV analgesics for pain control following their surgery.  They were given 24 hours of postoperative antibiotics.   PT was consulted postop to assist with mobility and transfers.  The patient was allowed to be WBAT with therapy and was taught back precautions. Discharge planning was consulted to help with postop disposition and equipment needs.  Patient had a good night on the evening of  surgery and started to get up OOB with therapy on day one. Patient was seen in rounds and was ready to go home on day one.  They were given discharge instructions and dressing directions.  They were instructed on when to follow up in the office with Dr. Tonita Cong.   Diet: Regular diet Activity:WBAT, Lspine precautions Follow-up:in 10-14 days Disposition - Home Discharged Condition: good    Allergies as of 02/16/2017      Reactions   Oxycodone-acetaminophen Nausea And Vomiting   Promethazine Nausea And Vomiting, Other (See Comments)   States ended up in the emergency room after taking the phenergan   Asa [aspirin] Other (See Comments)   Stomach burns    Morphine And Related Itching   Oxycodone Other (See Comments)   "it makes me feel crazy"      Medication List    STOP taking these medications   atorvastatin 20 MG tablet Commonly known as:  LIPITOR     TAKE these medications   albuterol 108 (90 Base) MCG/ACT inhaler Commonly known as:  PROVENTIL HFA;VENTOLIN HFA Inhale 2 puffs into the lungs every 6 (six) hours as needed for wheezing or shortness of breath.   amLODipine 10 MG tablet Commonly known as:  NORVASC Take 1 tablet (10 mg total) by mouth daily.   buPROPion 75 MG tablet Commonly known as:  WELLBUTRIN Take 1 tablet (75 mg total) by mouth 2 (two) times daily.   docusate sodium 100 MG capsule Commonly known as:  COLACE Take 1 capsule (100 mg total) by mouth 2 (two) times daily.   estrogen (conjugated)-medroxyprogesterone 0.3-1.5 MG tablet Commonly known as:  PREMPRO Take 1 tablet by mouth daily.   ferrous sulfate 325 (65 FE) MG tablet Take 1 tablet (325 mg total) by mouth 2 (two) times daily with a meal.   LORazepam 0.5 MG tablet Commonly known as:  ATIVAN Take 1 tablet (0.5 mg total) by mouth daily as needed for anxiety.   losartan 100 MG tablet Commonly known as:  COZAAR Take 1 tablet (100 mg total) by mouth daily. What changed:    how much to  take  when to take this   meloxicam 15 MG tablet Commonly known as:  MOBIC Take 1 tablet (15 mg total) by mouth daily.   metFORMIN 500 MG tablet Commonly known as:  GLUCOPHAGE 2 tab by mouth in the AM, and 1 tab in the  PM   methocarbamol 500 MG tablet Commonly known as:  ROBAXIN Take 1 tablet (500 mg total) by mouth every 6 (six) hours as needed for muscle spasms.   ondansetron 4 MG disintegrating tablet Commonly known as:  ZOFRAN-ODT Take 1 tablet (4 mg total) by mouth every 8 (eight) hours as needed for nausea or vomiting.   oxyCODONE-acetaminophen 5-325 MG tablet Commonly known as:  PERCOCET Take 1 tablet by mouth every 4 (four) hours as needed. What changed:    when to take this  reasons to take this   pantoprazole 40 MG tablet Commonly known as:  PROTONIX Take 1 tablet (40 mg total) by mouth daily.   polyethylene glycol powder powder Commonly known as:  GLYCOLAX/MIRALAX Take 17 g by mouth 2 (two) times daily. What changed:  when to take this   potassium chloride 10 MEQ tablet Commonly known as:  K-DUR Take 1 tablet (10 mEq total) by mouth daily.   pregabalin 75 MG capsule Commonly known as:  LYRICA Take 2 capsules (150 mg total) by mouth 2 (two) times daily.   sertraline 100 MG tablet Commonly known as:  ZOLOFT Take 1 tablet (100 mg total) by mouth daily.   Vitamin D3 50000 units Tabs Take 1 tablet by mouth once a week. What changed:    how much to take  when to take this   zolpidem 10 MG tablet Commonly known as:  AMBIEN Take 1 tablet (10 mg total) by mouth at bedtime as needed for sleep. What changed:  when to take this      Follow-up Information    Susa Day, MD Follow up in 2 week(s).   Specialty:  Orthopedic Surgery Contact information: [address] [place] [address] [phone]           Signed: Lacie Draft, PA-C Orthopaedic Surgery 02/17/2017, 6:02 AM

## 2017-02-17 NOTE — Op Note (Signed)
NAME:  [name], [name] NO.:  [id number]  MEDICAL RECORD NO.:  [id number]  LOCATION:                                 FACILITY:  PHYSICIAN:  Susa Day, M.D.         DATE OF BIRTH:  DATE OF PROCEDURE:  02/15/2017 DATE OF DISCHARGE:                              OPERATIVE REPORT   PREOPERATIVE DIAGNOSES:  Herniated nucleus pulposus, spinal stenosis, L2- 3.  POSTOPERATIVE DIAGNOSES:  Herniated nucleus pulposus, spinal stenosis, L2-3.  PROCEDURES PERFORMED: 1. Central decompression, L2-3 with a laminectomy of L2. 2. Microdiskectomy at L2-3.  ANESTHESIA:  General.  ASSISTANT:  Lacie Draft, PA.  HISTORY:  A 57 year old with L2-L3 radiculopathy secondary to extruded fragment, migrating cephalad, beneath the L2 nerve root.  She had hip flexor weakness, dysesthesias in the L2 nerve root.  She has been refractory to conservative treatment.  MRI indicating a disk herniation, migrating cephalad.  Refractory to conservative treatment, she was indicated for decompression.  The disk herniation had migrated cephalad beneath the L2 lamina.  We discussed the possibility of resecting that to gain access to the disk fragment.  Risks and benefits discussed including bleeding, infection, damage to the neurovascular structures, no change in symptoms, worsening of symptoms, DVT, PE, anesthetic complications, etc.  TECHNIQUE:  With the patient in supine position after induction of adequate general anesthesia, 2 g of Kefzol, placed prone on the [place] frame.  All bony prominences were well padded.  Lumbar region was prepped and draped in usual sterile fashion.  Two 18-gauge spinal needle was utilized to localize the L2-3 interspace, confirmed with x-ray. Incision was made from spinous process of above L2 to below L3. Subcutaneous tissue was dissected.  Electrocautery was utilized to achieve hemostasis.  Marcaine 0.25% with epinephrine was infiltrated in the perispinous  musculature.  Dorsolumbar fascia was divided in line with skin incision.  Paraspinous muscle elevated from lamina of L2-3, entirety of L2.  McCullough retractor was placed.  Operating microscope was draped and brought onto the surgical field.  No interlaminar space was noted.  We removed therefore the spinous process of L2.  Following this, she had fairly hard bone.  We used a combination of Leksell rongeur and cutting osteophyte rongeur and 2 and 3 mm Kerrison to perform central laminotomy of L2, continuing cephalad.  The foramen of L2-3 was identified.  Continued cephalad to identify the L2 root, again which was noted to be compressed on the MRI into the foramen.  She had epidural venous plexus which was cauterized.  We protected the root and removed ligamentum flavum from the interspace.  We swept beneath the thecal sac and cephalad and there was compression of the L2 root noted. Therefore, continued cephalad with removal of the lamina of L2 to gain access of the entirety of the root of L2.  On the cephalad edge of L2, there was hypertrophic venous plexus as well, this was cauterized with bipolar cautery.  Confirmatory radiograph obtained, noting the position above and below the L2 nerve root and the foramen of L2-3 and at the disk space of L1-2.  Next, we put a blunt nerve hook beneath the L2 nerve root from cephalad to  caudad and then again from caudad to the axilla of the root very carefully, sweeping underneath this region as there was a mass effect noted.  We were able to mobilize multiple free fragments of disk material, were sent to Pathology and then also evacuated.  There was epidural bleeding and we achieved epidural venous plexus cauterization with bipolar cautery and thrombin-soaked Gelfoam. We swept underneath the L2 root out into the foramen beneath the thecal sac, the shoulder and the axilla and distally.  No residual fragments were noted.  Multiple passes were made to  the disk space as well.  There was no rupture coming from the disk space and a neural probe passed freely out the foramen of L2 and of L1-2.  Confirmatory radiograph obtained and copiously irrigated with antibiotic irrigation, achieved meticulous hemostasis with bipolar cautery and thrombin-soaked Gelfoam. Good restoration of the thecal sac.  Good mobilization of the L2 root now without compression noted.  Therefore, copiously irrigated the wound.  On inspection, no evidence of CSF leakage or active bleeding. We closed the dorsolumbar fascia with 1 Vicryl, subcu with 2, and skin with Prolene.  Sterile dressing applied.  Placed supine on the hospital bed, extubated without difficulty, and transported to the recovery room in satisfactory condition.  The patient tolerated the procedure well.  No complications.  Blood loss was 25 mL.     Susa Day, M.D.     Geralynn Rile  D:  02/15/2017  T:  02/16/2017  Job:  [id number]

## 2017-03-03 ENCOUNTER — Other Ambulatory Visit: Payer: Self-pay

## 2017-03-03 ENCOUNTER — Emergency Department (HOSPITAL_COMMUNITY)

## 2017-03-03 ENCOUNTER — Encounter: Admitting: Nurse Practitioner

## 2017-03-03 ENCOUNTER — Encounter (HOSPITAL_COMMUNITY): Payer: Self-pay | Admitting: Emergency Medicine

## 2017-03-03 ENCOUNTER — Emergency Department (HOSPITAL_COMMUNITY)
Admission: EM | Admit: 2017-03-03 | Discharge: 2017-03-03 | Disposition: A | Discharge status: Home / Self Care | Attending: Emergency Medicine | Admitting: Emergency Medicine

## 2017-03-03 DIAGNOSIS — Y829 Unspecified medical devices associated with adverse incidents: Secondary | ICD-10-CM | POA: Diagnosis not present

## 2017-03-03 DIAGNOSIS — Z9889 Other specified postprocedural states: Secondary | ICD-10-CM | POA: Diagnosis not present

## 2017-03-03 DIAGNOSIS — R110 Nausea: Secondary | ICD-10-CM | POA: Diagnosis not present

## 2017-03-03 DIAGNOSIS — E119 Type 2 diabetes mellitus without complications: Secondary | ICD-10-CM | POA: Insufficient documentation

## 2017-03-03 DIAGNOSIS — Z79899 Other long term (current) drug therapy: Secondary | ICD-10-CM | POA: Diagnosis not present

## 2017-03-03 DIAGNOSIS — F17210 Nicotine dependence, cigarettes, uncomplicated: Secondary | ICD-10-CM | POA: Diagnosis not present

## 2017-03-03 DIAGNOSIS — Z7984 Long term (current) use of oral hypoglycemic drugs: Secondary | ICD-10-CM | POA: Diagnosis not present

## 2017-03-03 DIAGNOSIS — R1111 Vomiting without nausea: Secondary | ICD-10-CM | POA: Diagnosis not present

## 2017-03-03 DIAGNOSIS — I10 Essential (primary) hypertension: Secondary | ICD-10-CM | POA: Diagnosis not present

## 2017-03-03 DIAGNOSIS — J449 Chronic obstructive pulmonary disease, unspecified: Secondary | ICD-10-CM | POA: Diagnosis not present

## 2017-03-03 DIAGNOSIS — K2970 Gastritis, unspecified, without bleeding: Secondary | ICD-10-CM | POA: Diagnosis not present

## 2017-03-03 DIAGNOSIS — K668 Other specified disorders of peritoneum: Secondary | ICD-10-CM | POA: Diagnosis not present

## 2017-03-03 DIAGNOSIS — T8189XA Other complications of procedures, not elsewhere classified, initial encounter: Secondary | ICD-10-CM | POA: Insufficient documentation

## 2017-03-03 DIAGNOSIS — R1110 Vomiting, unspecified: Secondary | ICD-10-CM | POA: Insufficient documentation

## 2017-03-03 DIAGNOSIS — R109 Unspecified abdominal pain: Secondary | ICD-10-CM

## 2017-03-03 DIAGNOSIS — M546 Pain in thoracic spine: Secondary | ICD-10-CM | POA: Diagnosis not present

## 2017-03-03 DIAGNOSIS — R1011 Right upper quadrant pain: Secondary | ICD-10-CM | POA: Diagnosis present

## 2017-03-03 LAB — DIFFERENTIAL
Basophils Absolute: 0 10*3/uL (ref 0.0–0.1)
Basophils Relative: 1 %
EOS PCT: 5 %
Eosinophils Absolute: 0.4 10*3/uL (ref 0.0–0.7)
LYMPHS ABS: 3.9 10*3/uL (ref 0.7–4.0)
LYMPHS PCT: 49 %
Monocytes Absolute: 0.4 10*3/uL (ref 0.1–1.0)
Monocytes Relative: 6 %
NEUTROS PCT: 39 %
Neutro Abs: 3.1 10*3/uL (ref 1.7–7.7)

## 2017-03-03 LAB — CBC
HEMATOCRIT: 34.3 % — AB (ref 36.0–46.0)
Hemoglobin: 11.6 g/dL — ABNORMAL LOW (ref 12.0–15.0)
MCH: 30.9 pg (ref 26.0–34.0)
MCHC: 33.8 g/dL (ref 30.0–36.0)
MCV: 91.5 fL (ref 78.0–100.0)
Platelets: 261 10*3/uL (ref 150–400)
RBC: 3.75 MIL/uL — ABNORMAL LOW (ref 3.87–5.11)
RDW: 14.2 % (ref 11.5–15.5)
WBC: 7.8 10*3/uL (ref 4.0–10.5)

## 2017-03-03 LAB — URINALYSIS, ROUTINE W REFLEX MICROSCOPIC
Bilirubin Urine: NEGATIVE
GLUCOSE, UA: NEGATIVE mg/dL
Hgb urine dipstick: NEGATIVE
Ketones, ur: NEGATIVE mg/dL
LEUKOCYTES UA: NEGATIVE
Nitrite: NEGATIVE
PH: 6 (ref 5.0–8.0)
Protein, ur: NEGATIVE mg/dL
Specific Gravity, Urine: 1.038 — ABNORMAL HIGH (ref 1.005–1.030)

## 2017-03-03 LAB — COMPREHENSIVE METABOLIC PANEL
ALBUMIN: 4.7 g/dL (ref 3.5–5.0)
ALT: 17 U/L (ref 14–54)
AST: 23 U/L (ref 15–41)
Alkaline Phosphatase: 86 U/L (ref 38–126)
Anion gap: 8 (ref 5–15)
BUN: 11 mg/dL (ref 6–20)
CHLORIDE: 105 mmol/L (ref 101–111)
CO2: 25 mmol/L (ref 22–32)
Calcium: 9.7 mg/dL (ref 8.9–10.3)
Creatinine, Ser: 0.91 mg/dL (ref 0.44–1.00)
GFR calc Af Amer: >60 mL/min (ref >60)
GFR calc non Af Amer: >60 mL/min (ref >60)
GLUCOSE: 104 mg/dL — AB (ref 65–99)
Potassium: 3.5 mmol/L (ref 3.5–5.1)
SODIUM: 138 mmol/L (ref 135–145)
Total Bilirubin: 0.5 mg/dL (ref 0.3–1.2)
Total Protein: 7.6 g/dL (ref 6.5–8.1)

## 2017-03-03 LAB — I-STAT BETA HCG BLOOD, ED (MC, WL, AP ONLY): I-stat hCG, quantitative: <5 m[IU]/mL (ref <5)

## 2017-03-03 LAB — LIPASE, BLOOD: LIPASE: 40 U/L (ref 11–51)

## 2017-03-03 MED ORDER — ONDANSETRON 4 MG PO TBDP
4.0000 mg | ORAL_TABLET | Freq: Once | ORAL | Status: AC | PRN
  Administered 2017-03-03: 4 mg via ORAL
  Filled 2017-03-03: qty 1

## 2017-03-03 MED ORDER — SODIUM CHLORIDE 0.9 % IV BOLUS (SEPSIS)
1000.0000 mL | Freq: Once | INTRAVENOUS | Status: AC
Start: 2017-03-03 — End: 2017-03-03
  Administered 2017-03-03: 1000 mL via INTRAVENOUS

## 2017-03-03 MED ORDER — ONDANSETRON HCL 4 MG/2ML IJ SOLN
4.0000 mg | Freq: Once | INTRAMUSCULAR | Status: AC
  Administered 2017-03-03: 4 mg via INTRAVENOUS
  Filled 2017-03-03: qty 2

## 2017-03-03 MED ORDER — ONDANSETRON HCL 4 MG PO TABS: 4.0000 mg | ORAL_TABLET | Freq: Three times a day (TID) | ORAL | 0 refills | Status: DC | PRN

## 2017-03-03 MED ORDER — FENTANYL CITRATE (PF) 100 MCG/2ML IJ SOLN
100.0000 ug | Freq: Once | INTRAMUSCULAR | Status: AC
Start: 2017-03-03 — End: 2017-03-03
  Administered 2017-03-03: 100 ug via INTRAVENOUS
  Filled 2017-03-03: qty 2

## 2017-03-03 MED ORDER — IOPAMIDOL (ISOVUE-300) INJECTION 61%
INTRAVENOUS | Status: AC
  Administered 2017-03-03: 100 mL
  Filled 2017-03-03: qty 100

## 2017-03-03 MED ORDER — FENTANYL CITRATE (PF) 100 MCG/2ML IJ SOLN
100.0000 ug | Freq: Once | INTRAMUSCULAR | Status: AC
  Administered 2017-03-03: 100 ug via INTRAVENOUS
  Filled 2017-03-03: qty 2

## 2017-03-03 MED ORDER — OXYCODONE-ACETAMINOPHEN 5-325 MG PO TABS
1.0000 | ORAL_TABLET | Freq: Once | ORAL | Status: AC
  Administered 2017-03-03: 1 via ORAL
  Filled 2017-03-03: qty 1

## 2017-03-03 NOTE — Discharge Instructions (Signed)
Follow-up with Dr. Tonita Cong tomorrow or Friday (1/18).  On your CT scan, you were noted to have a 4 cm peritoneal cyst.  This will need to be followed up by your  primary care physician.  Return to the ER if you develop worsening back or abdominal pain, vomiting, or fevers.

## 2017-03-03 NOTE — ED Triage Notes (Signed)
Patient reports that she had back surgery 2 weeks ago for lumbar decompression

## 2017-03-03 NOTE — ED Triage Notes (Signed)
Pt arrived via [facility] with complaints of lower back pain increasing singe the Pt had surgery 2 weeks for herniated discs in her lumbar section. Pt also complains of N/V and LRQ pain and not being able to keep anything down.   Vitals 188/100 88 pulse 20 RR 98% RA 134 CBG Temp 99.0

## 2017-03-03 NOTE — ED Provider Notes (Signed)
[facility] DEPT Provider Note   CSN: [id number] Arrival date & time: 03/03/17  1506     History   Chief Complaint Chief Complaint  Patient presents with  . Back Pain  . Nausea  . Emesis    HPI [name] is a 57 y.o. female.  HPI  57 year old female presents with vomiting.  Started when she first woke up this morning.  She is also having right-sided abdominal pain.  She had lumbar microdiscectomy on 02/15/17.  She is had on and off back pain since.  She is having back pain today and it does seem a little worse than it has been.  She is having no new radicular symptoms or new weakness/numbness in her lower extremities.  No incontinence.  She has chronic left-sided leg weakness from the herniated disc prior to the surgery.  She has been afebrile and denies any chest pain or shortness of breath.  She denies hematemesis and no diarrhea or constipation.  The pain is severe.  When asked about swelling at the incisional site of her back, she is not sure if it has been swollen because she got see back there.  Past Medical History:  Diagnosis Date  . Anginal pain ([place])    hospitalized for chest wall strain  2 years; havent felt anything like that pain since   . Depression   . Diabetes mellitus without complication ([place])   . Diverticulitis   . Eating disorder   . Fluttering heart    per patient hx  . Frequent headaches   . GERD (gastroesophageal reflux disease)   . 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 History of fainting spells of unknown cause   . Hyperlipidemia   . Hypertension   . Pneumonia 2016   and bronchitis   . PONV (postoperative nausea and vomiting)   . PTSD (post-traumatic stress disorder)   . Renal disorder 2017   hospitalized for PNA and bronchitis, abx given caused ancute kidney injury ; resolved before D/C  . Vitamin D deficiency  01/30/2017    Patient Active Problem List   Diagnosis Date Noted  . HNP (herniated nucleus pulposus), lumbar 02/15/2017  . Left lumbar radiculopathy 01/31/2017  . Preop exam for internal medicine 01/31/2017  . Encounter for well adult exam with abnormal findings 01/30/2017  . Vitamin D deficiency 01/30/2017  . Hyperlipidemia   . Hypertension   . Dehydration 11/12/2016  . Chronic pain due to trauma 09/07/2016  . Type 2 diabetes mellitus ([place]) 09/07/2016  . Severe major depression ([place]) 09/07/2016  . PTSD (post-traumatic stress disorder) 09/07/2016  . COPD exacerbation ([place]) 02/28/2016  . Tobacco abuse 02/28/2016  . GERD (gastroesophageal reflux disease) 02/28/2016  . Acute bronchitis 02/28/2016    Past Surgical History:  Procedure Laterality Date  . BUNIONECTOMY    . c-section     . CESAREAN SECTION    . LUMBAR LAMINECTOMY/DECOMPRESSION MICRODISCECTOMY Left 02/15/2017   Procedure: Microlumbar decompression L2-3, microdiscectomy L2-L3;  Surgeon: Susa Day, MD;  Location: [facility];  Service: Orthopedics;  Laterality: Left;  120 mins  . URETHROPLASTY      OB History    No data available       Home Medications    Prior to Admission medications   Medication Sig Start Date End Date Taking? Authorizing Provider  albuterol (PROVENTIL HFA;VENTOLIN HFA)  108 (90 Base) MCG/ACT inhaler Inhale 2 puffs into the lungs every 6 (six) hours as needed for wheezing or shortness of breath. 01/30/17  Yes Biagio Borg, MD  amLODipine (NORVASC) 10 MG tablet Take 1 tablet (10 mg total) by mouth daily. 01/30/17  Yes Biagio Borg, MD  buPROPion (WELLBUTRIN) 75 MG tablet Take 1 tablet (75 mg total) by mouth 2 (two) times daily. 01/30/17  Yes Biagio Borg, MD  Cholecalciferol (VITAMIN D3) 50000 units TABS Take 1 tablet by mouth once a week. Patient taking differently: Take 50,000 Units by mouth every Friday.  09/10/16  Yes Golden Circle, FNP  docusate sodium (COLACE) 100 MG capsule Take 1  capsule (100 mg total) by mouth 2 (two) times daily. 02/15/17 02/15/18 Yes Susa Day, MD  estrogen, conjugated,-medroxyprogesterone (PREMPRO) 0.3-1.5 MG tablet Take 1 tablet by mouth daily.   Yes [provider]  ferrous sulfate 325 (65 FE) MG tablet Take 1 tablet (325 mg total) by mouth 2 (two) times daily with a meal. 02/16/17 02/16/18 Yes Constable, Amber, PA-C  LORazepam (ATIVAN) 0.5 MG tablet Take 1 tablet (0.5 mg total) by mouth daily as needed for anxiety. 01/30/17  Yes Biagio Borg, MD  losartan (COZAAR) 100 MG tablet Take 1 tablet (100 mg total) by mouth daily. Patient taking differently: Take 50 mg by mouth 2 (two) times daily.  01/30/17  Yes Biagio Borg, MD  metFORMIN (GLUCOPHAGE) 500 MG tablet 2 tab by mouth in the AM, and 1 tab in the PM 02/04/17  Yes Biagio Borg, MD  methocarbamol (ROBAXIN) 500 MG tablet Take 1 tablet (500 mg total) by mouth every 6 (six) hours as needed for muscle spasms. 02/16/17  Yes Constable, Amber, PA-C  ondansetron (ZOFRAN-ODT) 4 MG disintegrating tablet Take 1 tablet (4 mg total) by mouth every 8 (eight) hours as needed for nausea or vomiting. 11/12/16  Yes Golden Circle, FNP  oxyCODONE-acetaminophen (PERCOCET) 5-325 MG tablet Take 1 tablet by mouth every 4 (four) hours as needed. 02/15/17  Yes Susa Day, MD  pantoprazole (PROTONIX) 40 MG tablet Take 1 tablet (40 mg total) by mouth daily. 01/30/17  Yes Biagio Borg, MD  polyethylene glycol powder (GLYCOLAX/MIRALAX) powder Take 17 g by mouth 2 (two) times daily. Patient taking differently: Take 17 g by mouth daily.  03/06/16  Yes Muthersbaugh, Jarrett Soho, PA-C  potassium chloride (K-DUR) 10 MEQ tablet Take 1 tablet (10 mEq total) by mouth daily. 02/04/17  Yes Biagio Borg, MD  pregabalin (LYRICA) 75 MG capsule Take 2 capsules (150 mg total) by mouth 2 (two) times daily. 01/30/17  Yes Biagio Borg, MD  sertraline (ZOLOFT) 100 MG tablet Take 1 tablet (100 mg total) by mouth daily. 01/30/17  Yes  Biagio Borg, MD  zolpidem (AMBIEN) 10 MG tablet Take 1 tablet (10 mg total) by mouth at bedtime as needed for sleep. Patient taking differently: Take 10 mg by mouth at bedtime.  01/30/17 03/03/17 Yes Biagio Borg, MD  meloxicam (MOBIC) 15 MG tablet Take 1 tablet (15 mg total) by mouth daily. 01/30/17   Biagio Borg, MD  ondansetron (ZOFRAN) 4 MG tablet Take 1 tablet (4 mg total) by mouth every 8 (eight) hours as needed for nausea or vomiting. 03/03/17   Sherwood Gambler, MD    Family History Family History  Problem Relation Age of Onset  . Depression Mother   . Diabetes Mother   . Hypertension Mother   . Hyperlipidemia  Mother   . Depression Maternal Grandmother     Social History Social History   Tobacco Use  . Smoking status: Current Every Day Smoker    Packs/day: 1.00    Years: 35.00    Pack years: 35.00    Types: Cigarettes  . Smokeless tobacco: Never Used  Substance Use Topics  . Alcohol use: No  . Drug use: No     Allergies   Oxycodone-acetaminophen; Promethazine; Asa [aspirin]; Morphine and related; and Oxycodone   Review of Systems Review of Systems  Constitutional: Negative for fever.  Respiratory: Negative for shortness of breath.   Cardiovascular: Negative for chest pain.  Gastrointestinal: Positive for abdominal pain, nausea and vomiting. Negative for constipation and diarrhea.  Genitourinary: Negative for dysuria.  Musculoskeletal: Positive for back pain.  All other systems reviewed and are negative.    Physical Exam Updated Vital Signs BP (!) 153/85 (BP Location: Right Arm)   Pulse 64   Temp 97.8 F (36.6 C) (Oral)   Resp 14   Ht 5\' 5"  (1.651 m)   Wt 57.6 kg (127 lb)   LMP 01/16/2005 (Approximate) Comment: no menstrual cycle since 2006   SpO2 100%   BMI 21.13 kg/m   Physical Exam  Constitutional: She is oriented to person, place, and time. She appears well-developed and well-nourished.  Crying due to pain  HENT:  Head: Normocephalic and  atraumatic.  Right Ear: External ear normal.  Left Ear: External ear normal.  Nose: Nose normal.  Eyes: Right eye exhibits no discharge. Left eye exhibits no discharge.  Cardiovascular: Normal rate, regular rhythm and normal heart sounds.  Pulmonary/Chest: Effort normal and breath sounds normal.  Abdominal: Soft. There is tenderness in the right upper quadrant and epigastric area.  Musculoskeletal:       Lumbar back: She exhibits tenderness and swelling.       Back:  Neurological: She is alert and oriented to person, place, and time.  5/5 strength in BLE, right stronger than left. Normal gross sensation  Skin: Skin is warm and dry.  Nursing note and vitals reviewed.    ED Treatments / Results  Labs (all labs ordered are listed, but only abnormal results are displayed) Labs Reviewed  COMPREHENSIVE METABOLIC PANEL - Abnormal; Notable for the following components:      Result Value   Glucose, Bld 104 (*)    All other components within normal limits  CBC - Abnormal; Notable for the following components:   RBC 3.75 (*)    Hemoglobin 11.6 (*)    HCT 34.3 (*)    All other components within normal limits  URINALYSIS, ROUTINE W REFLEX MICROSCOPIC - Abnormal; Notable for the following components:   Specific Gravity, Urine 1.038 (*)    All other components within normal limits  LIPASE, BLOOD  DIFFERENTIAL  I-STAT BETA HCG BLOOD, ED ([facility], [facility], [facility] ONLY)    EKG  EKG Interpretation None       Radiology Ct Abdomen Pelvis W Contrast  Result Date: 03/03/2017 CLINICAL DATA:  Right lower quadrant pain and nausea with vomiting 2 weeks. Approximately 2 weeks status post lumbar spine surgery. EXAM: CT ABDOMEN AND PELVIS WITH CONTRAST TECHNIQUE: Multidetector CT imaging of the abdomen and pelvis was performed using the standard protocol following bolus administration of intravenous contrast. CONTRAST:  151mL ISOVUE-300 IOPAMIDOL (ISOVUE-300) INJECTION 61% COMPARISON:  03/05/2016 FINDINGS:  Lower Chest: No acute findings. Hepatobiliary: No hepatic masses identified. Gallbladder is unremarkable. Pancreas:  No mass or inflammatory changes.  Spleen: Within normal limits in size and appearance. Adrenals/Urinary Tract: No masses identified. Mild bilateral renal parenchymal scarring. No evidence of hydronephrosis. Stomach/Bowel: No evidence of obstruction, inflammatory process or abnormal fluid collections. Normal appendix visualized. Vascular/Lymphatic: No pathologically enlarged lymph nodes. No abdominal aortic aneurysm. Aortic atherosclerosis. Reproductive:  No mass or other significant abnormality. Other: A simple appearing intraperitoneal cyst is seen in the anterior superior pelvis between small bowel loops and rectus muscles which measures 3.1 x 4.5 cm on image 50/2. No other peritoneal cysts or nodules are identified and there is no evidence of ascites. Musculoskeletal: No suspicious bone lesions identified. Recent laminectomy at L2. A small rim enhancing fluid collection is seen in the midline posterior paraspinal soft tissues at this site, which measures 2.4 x 1.9 cm on image 15/7. IMPRESSION: Recent L2 laminectomy, with 2.4 cm rim enhancing fluid collection in midline posterior paraspinal soft tissues. Infected postop fluid collection cannot be excluded. 4.5 cmperitoneal cyst in the anterior superior pelvis, with probably benign characteristics and stable since previous study approximately 1 year ago. Differential diagnosis includes enteric duplication cyst, lymphangioma, mesothelioma, and mucinous cystic neoplasm. Suggest correlation with tumor markers, and consider continued followup with CT versus surgical evaluation. Electronically Signed   By: Earle Gell M.D.   On: 03/03/2017 19:54   US Abdomen Limited Ruq  Result Date: 03/03/2017 CLINICAL DATA:  Right upper quadrant pain EXAM: ULTRASOUND ABDOMEN LIMITED RIGHT UPPER QUADRANT COMPARISON:  None. FINDINGS: Gallbladder: No gallstones or wall  thickening visualized. No sonographic Murphy sign noted by sonographer. Common bile duct: Diameter: 2 mm. Liver: No focal lesion identified. Within normal limits in parenchymal echogenicity. Portal vein is patent on color Doppler imaging with normal direction of blood flow towards the liver. IMPRESSION: Within normal limits. Electronically Signed   By: Marybelle Killings M.D.   On: 03/03/2017 21:21    Procedures Procedures (including critical care time)  Medications Ordered in ED Medications  ondansetron (ZOFRAN-ODT) disintegrating tablet 4 mg (4 mg Oral Given 03/03/17 1705)  sodium chloride 0.9 % bolus 1,000 mL (0 mLs Intravenous Stopped 03/03/17 2131)  ondansetron (ZOFRAN) injection 4 mg (4 mg Intravenous Given 03/03/17 1803)  fentaNYL (SUBLIMAZE) injection 100 mcg (100 mcg Intravenous Given 03/03/17 1803)  iopamidol (ISOVUE-300) 61 % injection (100 mLs  Contrast Given 03/03/17 1922)  fentaNYL (SUBLIMAZE) injection 100 mcg (100 mcg Intravenous Given 03/03/17 2138)  ondansetron (ZOFRAN) injection 4 mg (4 mg Intravenous Given 03/03/17 2137)  oxyCODONE-acetaminophen (PERCOCET/ROXICET) 5-325 MG per tablet 1 tablet (1 tablet Oral Given 03/03/17 2228)     Initial Impression / Assessment and Plan / ED Course  I have reviewed the triage vital signs and the nursing notes.  Pertinent labs & imaging results that were available during my care of the patient were reviewed by me and considered in my medical decision making (see chart for details).     Unclear if patient's abdominal pain is coming from the back and associated muscle pain.  Her abdominal CT shows no acute pathology that would explain abdominal pain.  She does have a peritoneal cyst that she was made aware of but this is not correlating with the right-sided abdominal pain she is having.  Ultrasound was obtained and shows no cholecystitis or gallstones.  She does have a rim-enhancing fluid collection near the surgical site.  I discussed this with  orthopedics on-call, Dr. Lyla Glassing, who feels this is unlikely to be infectious given no fever, normal WBC, and the wound externally looks okay as far  as no dehiscence, drainage, or redness.  Recommend she follow-up with the orthopedist, Dr. Maxie Better, tomorrow or the next day.  She was made aware of these recommendations as well as the need to follow-up with her PCP for the cyst.  Her nausea and pain are controlled.  Will be given Zofran tablet per her request and she has Percocet at home.  Discussed return precautions.  Final Clinical Impressions(s) / ED Diagnoses   Final diagnoses:  Right sided abdominal pain  Lumbar surgical wound fluid collection, initial encounter  Peritoneal cyst    ED Discharge Orders        Ordered    ondansetron (ZOFRAN) 4 MG tablet  Every 8 hours PRN     03/03/17 2221       Sherwood Gambler, MD 03/03/17 2323

## 2017-03-04 ENCOUNTER — Ambulatory Visit

## 2017-03-04 ENCOUNTER — Encounter (INDEPENDENT_AMBULATORY_CARE_PROVIDER_SITE_OTHER): Admitting: Podiatry

## 2017-03-04 DIAGNOSIS — M2012 Hallux valgus (acquired), left foot: Secondary | ICD-10-CM

## 2017-03-04 NOTE — Progress Notes (Signed)
This encounter was created in error - please disregard.

## 2017-03-11 DIAGNOSIS — Z4889 Encounter for other specified surgical aftercare: Secondary | ICD-10-CM | POA: Insufficient documentation

## 2017-03-11 DIAGNOSIS — Z9889 Other specified postprocedural states: Secondary | ICD-10-CM | POA: Insufficient documentation

## 2017-03-26 ENCOUNTER — Ambulatory Visit (INDEPENDENT_AMBULATORY_CARE_PROVIDER_SITE_OTHER): Admitting: Gynecology

## 2017-03-26 ENCOUNTER — Encounter: Payer: Self-pay | Admitting: Gynecology

## 2017-03-26 VITALS — BP 122/74 | Ht 64.0 in | Wt 133.0 lb

## 2017-03-26 DIAGNOSIS — Z1151 Encounter for screening for human papillomavirus (HPV): Secondary | ICD-10-CM | POA: Diagnosis not present

## 2017-03-26 DIAGNOSIS — N951 Menopausal and female climacteric states: Secondary | ICD-10-CM

## 2017-03-26 DIAGNOSIS — Z01411 Encounter for gynecological examination (general) (routine) with abnormal findings: Secondary | ICD-10-CM

## 2017-03-26 DIAGNOSIS — Z79890 Hormone replacement therapy (postmenopausal): Secondary | ICD-10-CM

## 2017-03-26 DIAGNOSIS — N952 Postmenopausal atrophic vaginitis: Secondary | ICD-10-CM | POA: Diagnosis not present

## 2017-03-26 MED ORDER — ESTRADIOL 0.5 MG PO TABS: 0.5000 mg | ORAL_TABLET | Freq: Every day | ORAL | 4 refills | Status: DC

## 2017-03-26 MED ORDER — PROGESTERONE MICRONIZED 100 MG PO CAPS: 100.0000 mg | ORAL_CAPSULE | Freq: Every day | ORAL | 4 refills | Status: DC

## 2017-03-26 NOTE — Patient Instructions (Signed)
Schedule your colonoscopy with either:  [facility]   Address: [address], [place], [place] [address]  [phone]    or  [facility]  Address: [address], [place], [place] [address]  [phone]      

## 2017-03-26 NOTE — Addendum Note (Signed)
Addended by: Nelva Nay on: 03/26/2017 02:44 PM   Modules accepted: Orders

## 2017-03-26 NOTE — Progress Notes (Signed)
[name] 04/22/1960 [id number]        57 y.o.  G2P2 new patient for annual gynecologic exam.  Patient also complaining of hot flushes and night sweats.  Currently on Prempro 0.3/1.5 and has been on this for several years.  Does not seem to be controlling her menopausal symptoms.  Past medical history,surgical history, problem list, medications, allergies, family history and social history were all reviewed and documented as reviewed in the EPIC chart.  ROS:  Performed with pertinent positives and negatives included in the history, assessment and plan.   Additional significant findings : None   Exam: [name] assistant Vitals:   03/26/17 1359  BP: 122/74  Weight: 133 lb (60.3 kg)  Height: 5\' 4"  (1.626 m)   Body mass index is 22.83 kg/m.  General appearance:  Normal affect, orientation and appearance. Skin: Grossly normal HEENT: Without gross lesions.  No cervical or supraclavicular adenopathy. Thyroid normal.  Lungs:  Clear without wheezing, rales or rhonchi Cardiac: RR, without RMG Abdominal:  Soft, nontender, without masses, guarding, rebound, organomegaly or hernia Breasts:  Examined lying and sitting without masses, retractions, discharge or axillary adenopathy. Pelvic:  Ext, BUS, Vagina:  Cervix: With atrophic changes.  Pap smear/HPV done  Uterus: Anteverted, normal size, shape and contour, midline and mobile nontender   Adnexa: Without masses or tenderness    Anus and perineum: Normal   Rectovaginal: Normal sphincter tone without palpated masses or tenderness.    Assessment/Plan:  57 y.o. G2P2 female for annual gynecologic exam.   1. Menopausal/atrophic genital changes/menopausal symptoms/HRT.  Patient on Prempro 0.3/1.5 but continues to have hot flushes and sweats.  She wakes up drenched despite taking the HRT.  I reviewed her options to include changing to a different regiment as well as nonhormonal pharmacologic such as Effexor.  She already is on  Zoloft and several other medications.  She also smokes.  I reviewed the risks of HRT to include increased risk of thrombosis such as stroke heart attack DVT in the breast cancer issue and risks.  Benefits to include symptom relief as well as possible cardiovascular and bone health particularly when started early also discussed.  At this point patient wants to try different regiment.  We will increase her estrogen to estradiol 0.5 mg and switch to Prometrium 100 mg at bedtime.  After a month or 2 if she does not feel better relief she will call and we will increase her to 1 mg estradiol.  Refill not for 1 year provided. 2. Mammography 2016.  Patient has mammogram scheduled and will follow-up for this.  Breast exam normal today. 3. Colonoscopy 10 years ago.  Recommended follow-up colonoscopy now and she agrees to arrange for this. 4. Pap smear 2014.  Pap smear/HPV today.  No history of abnormal Pap smears previously. 5. DEXA never.  Will plan further into the menopause. 6. Health maintenance.  Recent evaluation for pain/nausea/vomiting T scan described a 3.1 x 4.5 cm simple appearing intraperitoneal cyst anterior superior pelvis between small bowel loops and rectus muscle.   Stable from prior study 1 year ago.  Patient does have appointment with general surgeon.  Possibly related to history of cesarean section x2 in the past.  Reviewed with patient I think if asymptomatic and has remained stable that this certainly could be something that can be followed.  Regardless she has appointment with general surgeon she will follow-up with them in their recommendations.  No routine lab work done as patient does  this elsewhere.  Follow-up in 1 year, sooner as needed.  Additional time in excess of her routine gynecologic exam was spent in direct face to face counseling and coordination of care in regards to her menopausal symptoms and hormone replacement therapy.   Angela Auerbach MD, 2:20 PM 03/26/2017

## 2017-03-31 LAB — PAP IG AND HPV HIGH-RISK: HPV DNA HIGH RISK: NOT DETECTED

## 2017-04-13 ENCOUNTER — Ambulatory Visit (INDEPENDENT_AMBULATORY_CARE_PROVIDER_SITE_OTHER)

## 2017-04-13 ENCOUNTER — Ambulatory Visit (INDEPENDENT_AMBULATORY_CARE_PROVIDER_SITE_OTHER): Admitting: Podiatry

## 2017-04-13 DIAGNOSIS — M2012 Hallux valgus (acquired), left foot: Secondary | ICD-10-CM | POA: Diagnosis not present

## 2017-04-13 DIAGNOSIS — B353 Tinea pedis: Secondary | ICD-10-CM | POA: Diagnosis not present

## 2017-04-13 MED ORDER — KETOCONAZOLE 2 % EX CREA: 1.0000 "application " | TOPICAL_CREAM | Freq: Two times a day (BID) | CUTANEOUS | 2 refills | Status: DC

## 2017-04-13 NOTE — Patient Instructions (Addendum)
Bunion A bunion is a bump on the base of the big toe that forms when the bones of the big toe joint move out of position. Bunions may be small at first, but they often get larger over time. The can make walking painful. What are the causes? A bunion may be caused by:  Wearing narrow or pointed shoes that force the big toe to press against the other toes.  Abnormal foot development that causes the foot to roll inward (pronate).  Changes in the foot that are caused by certain diseases, such as rheumatoid arthritis and polio.  A foot injury.  What increases the risk? The following factors may make you more likely to develop this condition:  Wearing shoes that squeeze the toes together.  Having certain diseases, such as: ? Rheumatoid arthritis. ? Polio. ? Cerebral palsy.  Having family members who have bunions.  Being born with a foot deformity, such as flat feet or low arches.  Doing activities that put a lot of pressure on the feet, such as ballet dancing.  What are the signs or symptoms? The main symptom of a bunion is a noticeable bump on the big toe. Other symptoms may include:  Pain.  Swelling around the big toe.  Redness and inflammation.  Thick or hardened skin on the big toe or between the toes.  Stiffness or loss of motion in the big toe.  Trouble with walking.  How is this diagnosed? A bunion may be diagnosed based on your symptoms, medical history, and activities. You may have tests, such as:  X-rays. These allow your health care provider to check the position of the bones in your foot and look for damage to your joint. They also help your health care provider to determine the severity of your bunion and the best way to treat it.  Joint aspiration. In this test, a sample of fluid is removed from the toe joint. This test, which may be done if you are in a lot of pain, helps to rule out diseases that cause painful swelling of the joints, such as  arthritis.  How is this treated? There is no cure for a bunion, but treatment can help to prevent a bunion from getting worse. Treatment depends on the severity of your symptoms. Your health care provider may recommend:  Wearing shoes that have a wide toe box.  Using bunion pads to cushion the affected area.  Taping your toes together to keep them in a normal position.  Placing a device inside your shoe (orthotics) to help reduce pressure on your toe joint.  Taking medicine to ease pain, inflammation, and swelling.  Applying heat or ice to the affected area.  Doing stretching exercises.  Surgery to remove scar tissue and move the toes back into their normal position. This treatment is rare.  Follow these instructions at home:  Support your toe joint with proper footwear, shoe padding, or taping as told by your health care provider.  Take over-the-counter and prescription medicines only as told by your health care provider.  If directed, apply ice to the injured area: ? Put ice in a plastic bag. ? Place a towel between your skin and the bag. ? Leave the ice on for 20 minutes, 2-3 times per day.  If directed, apply heat to the affected area before you exercise. Use the heat source that your health care provider recommends, such as a moist heat pack or a heating pad. ? Place a towel between your   skin and the heat source. ? Leave the heat on for 20-30 minutes. ? Remove the heat if your skin turns bright red. This is especially important if you are unable to feel pain, heat, or cold. You may have a greater risk of getting burned.  Do exercises as told by your health care provider.  Keep all follow-up visits as told by your health care provider. Contact a health care provider if:  Your symptoms get worse.  Your symptoms do not improve in 2 weeks. Get help right away if:  You have severe pain and trouble with walking. This information is not intended to replace advice given  to you by your health care provider. Make sure you discuss any questions you have with your health care provider. Document Released: 02/02/2005 Document Revised: 07/11/2015 Document Reviewed: 09/02/2014 Elsevier Interactive Patient Education  2018 [employer]  Pre-Operative Instructions  Congratulations, you have decided to take an important step towards improving your quality of life.  You can be assured that the doctors and staff at [facility] will be with you every step of the way.  Here are some important things you should know:  1. Plan to be at the surgery center/hospital at least 1 (one) hour prior to your scheduled time, unless otherwise directed by the surgical center/hospital staff.  You must have a responsible adult accompany you, remain during the surgery and drive you home.  Make sure you have directions to the surgical center/hospital to ensure you arrive on time. 2. If you are having surgery at [facility] or [facility] hospitals, you will need a copy of your medical history and physical form from your family physician within one month prior to the date of surgery. We will give you a form for your primary physician to complete.  3. We make every effort to accommodate the date you request for surgery.  However, there are times where surgery dates or times have to be moved.  We will contact you as soon as possible if a change in schedule is required.   4. No aspirin/ibuprofen for one week before surgery.  If you are on aspirin, any non-steroidal anti-inflammatory medications (Mobic, Aleve, Ibuprofen) should not be taken seven (7) days prior to your surgery.  You make take Tylenol for pain prior to surgery.  5. Medications - If you are taking daily heart and blood pressure medications, seizure, reflux, allergy, asthma, anxiety, pain or diabetes medications, make sure you notify the surgery center/hospital before the day of surgery so they can tell you which medications you should  take or avoid the day of surgery. 6. No food or drink after midnight the night before surgery unless directed otherwise by surgical center/hospital staff. 7. No alcoholic beverages 24-hours prior to surgery.  No smoking 24-hours prior or 24-hours after surgery. 8. Wear loose pants or shorts. They should be loose enough to fit over bandages, boots, and casts. 9. Don't wear slip-on shoes. Sneakers are preferred. 10. Bring your boot with you to the surgery center/hospital.  Also bring crutches or a walker if your physician has prescribed it for you.  If you do not have this equipment, it will be provided for you after surgery. 11. If you have not been contacted by the surgery center/hospital by the day before your surgery, call to confirm the date and time of your surgery. 12. Leave-time from work may vary depending on the type of surgery you have.  Appropriate arrangements should be made prior   to surgery with your employer. 13. Prescriptions will be provided immediately following surgery by your doctor.  Fill these as soon as possible after surgery and take the medication as directed. Pain medications will not be refilled on weekends and must be approved by the doctor. 14. Remove nail polish on the operative foot and avoid getting pedicures prior to surgery. 15. Wash the night before surgery.  The night before surgery wash the foot and leg well with water and the antibacterial soap provided. Be sure to pay special attention to beneath the toenails and in between the toes.  Wash for at least three (3) minutes. Rinse thoroughly with water and dry well with a towel.  Perform this wash unless told not to do so by your physician.  Enclosed: 1 Ice pack (please put in freezer the night before surgery)   1 Hibiclens skin cleaner   Pre-op instructions  If you have any questions regarding the instructions, please do not hesitate to call our office.  [place]: [address], [place], [place] [address] --  [phone]  [place]: [address] [place], [place] [address] -- [phone]  [place]: [address]  [place], [place] [address] -- [phone]  [place]: [address], [address], [place], [place] [address] -- [phone]  Website: [email or web address]  
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
## 2017-04-19 ENCOUNTER — Ambulatory Visit
Admission: RE | Admit: 2017-04-19 | Discharge: 2017-04-19 | Disposition: A | Discharge status: Home / Self Care | Source: Ambulatory Visit | Attending: Internal Medicine | Admitting: Internal Medicine

## 2017-04-19 DIAGNOSIS — Z1231 Encounter for screening mammogram for malignant neoplasm of breast: Secondary | ICD-10-CM

## 2017-05-10 ENCOUNTER — Other Ambulatory Visit (INDEPENDENT_AMBULATORY_CARE_PROVIDER_SITE_OTHER)

## 2017-05-10 ENCOUNTER — Encounter: Payer: Self-pay | Admitting: Nurse Practitioner

## 2017-05-10 ENCOUNTER — Ambulatory Visit (INDEPENDENT_AMBULATORY_CARE_PROVIDER_SITE_OTHER): Admitting: Nurse Practitioner

## 2017-05-10 VITALS — BP 184/92 | HR 96 | Temp 98.6°F | Resp 16 | Ht 64.0 in | Wt 131.0 lb

## 2017-05-10 DIAGNOSIS — E559 Vitamin D deficiency, unspecified: Secondary | ICD-10-CM

## 2017-05-10 DIAGNOSIS — F329 Major depressive disorder, single episode, unspecified: Secondary | ICD-10-CM | POA: Diagnosis not present

## 2017-05-10 DIAGNOSIS — F419 Anxiety disorder, unspecified: Secondary | ICD-10-CM

## 2017-05-10 DIAGNOSIS — E119 Type 2 diabetes mellitus without complications: Secondary | ICD-10-CM | POA: Diagnosis not present

## 2017-05-10 DIAGNOSIS — R079 Chest pain, unspecified: Secondary | ICD-10-CM

## 2017-05-10 DIAGNOSIS — I10 Essential (primary) hypertension: Secondary | ICD-10-CM

## 2017-05-10 DIAGNOSIS — G629 Polyneuropathy, unspecified: Secondary | ICD-10-CM

## 2017-05-10 DIAGNOSIS — K219 Gastro-esophageal reflux disease without esophagitis: Secondary | ICD-10-CM

## 2017-05-10 LAB — HEMOGLOBIN A1C: Hgb A1c MFr Bld: 6.3 % (ref 4.6–6.5)

## 2017-05-10 LAB — BASIC METABOLIC PANEL
BUN: 10 mg/dL (ref 6–23)
CO2: 27 mEq/L (ref 19–32)
CREATININE: 0.87 mg/dL (ref 0.40–1.20)
Calcium: 9.6 mg/dL (ref 8.4–10.5)
Chloride: 107 mEq/L (ref 96–112)
GFR: 87.17 mL/min (ref >60.00)
GLUCOSE: 117 mg/dL — AB (ref 70–99)
Potassium: 3.5 mEq/L (ref 3.5–5.1)
Sodium: 141 mEq/L (ref 135–145)

## 2017-05-10 LAB — VITAMIN D 25 HYDROXY (VIT D DEFICIENCY, FRACTURES): VITD: 23.49 ng/mL — ABNORMAL LOW (ref 30.00–100.00)

## 2017-05-10 LAB — MAGNESIUM: MAGNESIUM: 1.8 mg/dL (ref 1.5–2.5)

## 2017-05-10 MED ORDER — TRIAMTERENE-HCTZ 37.5-25 MG PO TABS: 1.0000 | ORAL_TABLET | Freq: Every day | ORAL | 1 refills | Status: DC

## 2017-05-10 MED ORDER — ESOMEPRAZOLE MAGNESIUM 20 MG PO CPDR: 20.0000 mg | DELAYED_RELEASE_CAPSULE | Freq: Every day | ORAL | 1 refills | Status: DC

## 2017-05-10 MED ORDER — LOSARTAN POTASSIUM 100 MG PO TABS: 100.0000 mg | ORAL_TABLET | Freq: Every day | ORAL | 7 refills | Status: DC

## 2017-05-10 MED ORDER — LORAZEPAM 0.5 MG PO TABS: 0.5000 mg | ORAL_TABLET | Freq: Every day | ORAL | 0 refills | Status: DC | PRN

## 2017-05-10 MED ORDER — BUPROPION HCL 75 MG PO TABS: 75.0000 mg | ORAL_TABLET | Freq: Two times a day (BID) | ORAL | 7 refills | Status: DC

## 2017-05-10 MED ORDER — AMLODIPINE BESYLATE 10 MG PO TABS: 10.0000 mg | ORAL_TABLET | Freq: Every day | ORAL | 7 refills | Status: DC

## 2017-05-10 MED ORDER — GABAPENTIN 300 MG PO CAPS: 300.0000 mg | ORAL_CAPSULE | Freq: Every day | ORAL | 7 refills | Status: DC

## 2017-05-10 MED ORDER — SERTRALINE HCL 100 MG PO TABS: 100.0000 mg | ORAL_TABLET | Freq: Every day | ORAL | 7 refills | Status: DC

## 2017-05-10 NOTE — Patient Instructions (Addendum)
Please head downstairs for lab work.  Please continue your amlodipine and losartan Please ADD triamterene- hydrochlorothiazide 37.5-25 once daily for your high blood pressure.  Please stop your lyrica. Please start Neurontin 300mg  at bedtime for your neuropathy.  Please stop your protonix. Please start nexium 20mg  daily for your acid reflux.  I have placed a referral to cardiology and psychology. Our office will call you to schedule this appointment. You should hear from our office in 7-10 days.  Please return in 2-3 weeks for follow up.
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
## 2017-05-11 ENCOUNTER — Telehealth: Payer: Self-pay | Admitting: *Deleted

## 2017-05-11 NOTE — Telephone Encounter (Signed)
"  I am scheduled for foot surgery on next Friday, April 5 with Dr. Milinda Pointer.  I hadn't heard anything from anybody.  I went on line to do the registration.  I'm not sure if I did it right because of the fact that I haven't heard from anybody.  I'm hoping you guys have my right number.  Please give me a call.

## 2017-05-12 MED ORDER — TRAMADOL HCL 50 MG PO TABS: 50.0000 mg | ORAL_TABLET | Freq: Three times a day (TID) | ORAL | 0 refills | Status: DC | PRN

## 2017-05-12 NOTE — Telephone Encounter (Signed)
I am returning your call from yesterday.  "I already spoke to a nurse.  I guess I am going to have to cancel the surgery.  I have to get Cardiac clearance.  I have been having chest pains.  I had asked the nurse about getting something for pain.  I haven't heard anything back from her."  I wanted to make sure that we are cancelling your surgery.  "Yes for now, I been waiting on this surgery."  I'll get the nurse to call you back regarding the medication.

## 2017-05-12 NOTE — Telephone Encounter (Signed)
Tramadol orders called to [facility].

## 2017-05-12 NOTE — Telephone Encounter (Signed)
Tramadol would be ok.

## 2017-05-12 NOTE — Telephone Encounter (Signed)
I informed pt of Dr. Hyatt's orders. 

## 2017-05-12 NOTE — Addendum Note (Signed)
Addended by: Harriett Sine D on: 05/12/2017 05:23 PM   Modules accepted: Orders

## 2017-05-17 ENCOUNTER — Encounter: Payer: Self-pay | Admitting: Nurse Practitioner

## 2017-05-17 ENCOUNTER — Ambulatory Visit: Admitting: Cardiology

## 2017-05-17 DIAGNOSIS — G629 Polyneuropathy, unspecified: Secondary | ICD-10-CM | POA: Insufficient documentation

## 2017-05-17 HISTORY — DX: Polyneuropathy, unspecified: G62.9

## 2017-05-17 NOTE — Assessment & Plan Note (Signed)
BP is highly elevated today with reported daily medication compliance Will continue current medications and ADD  triamterene- hydrochlorothiazide 37.5-25 EKG referral to cardiology today for intermittent CP and elevated BP. - amLODipine (NORVASC) 10 MG tablet; Take 1 tablet (10 mg total) by mouth daily.  Dispense: 30 tablet; Refill: 7 - losartan (COZAAR) 100 MG tablet; Take 1 tablet (100 mg total) by mouth daily.  Dispense: 30 tablet; Refill: 7 - triamterene-hydrochlorothiazide (MAXZIDE-25) 37.5-25 MG tablet; Take 1 tablet by mouth daily.  Dispense: 30 tablet; Refill: 1 - Basic metabolic panel; Future - EKG 12-Lead: I have personally reviewed the EKG tracing and agree with computerized printout as noted: sinus rhythm without acute abnormalities Return precautions including calling 911 for chest pain or shortness of breath were discussed. - Ambulatory referral to Cardiology RTC in 2 weeks for F/U

## 2017-05-17 NOTE — Assessment & Plan Note (Signed)
Will stop protonix and start nexium Referral to GI if no improvement on a different PPI RTC in 2 weeks for F/U - Magnesium; Future - esomeprazole (NEXIUM) 20 MG capsule; Take 1 capsule (20 mg total) by mouth daily.  Dispense: 30 capsule; Refill: 1

## 2017-05-17 NOTE — Assessment & Plan Note (Signed)
Continue metformin Update A1c, will F/U with further recommendations pending lab work - gabapentin (NEURONTIN) 300 MG capsule; Take 1 capsule (300 mg total) by mouth daily.  Dispense: 30 capsule; Refill: 7 - Hemoglobin A1c; Future

## 2017-05-17 NOTE — Assessment & Plan Note (Signed)
Stop lyrica, start neurontin- dosing and side effects discused - gabapentin (NEURONTIN) 300 MG capsule; Take 1 capsule (300 mg total) by mouth daily.  Dispense: 30 capsule; Refill: 7 - Magnesium; Future - Basic metabolic panel; Future RTC in 2 weeks for F/U

## 2017-05-17 NOTE — Assessment & Plan Note (Signed)
Update labs today Will F/U with further recommendations pending lab results - Vitamin D (25 hydroxy); Future

## 2017-05-18 ENCOUNTER — Encounter: Payer: Self-pay | Admitting: *Deleted

## 2017-05-18 MED ORDER — VITAMIN D (ERGOCALCIFEROL) 1.25 MG (50000 UNIT) PO CAPS: 50000.0000 [IU] | ORAL_CAPSULE | ORAL | 0 refills | Status: DC

## 2017-05-27 ENCOUNTER — Encounter

## 2017-05-27 ENCOUNTER — Telehealth: Payer: Self-pay | Admitting: *Deleted

## 2017-05-27 NOTE — Telephone Encounter (Signed)
"  I was calling to try to reschedule an appointment with Dr. Milinda Pointer to have a Bunion removed.  He had already scheduled me for surgery before but when I filled out my pre-op papers I expressed that I had had some chest pains earlier in the year because I had just had lumbar back surgery on December 31.  So, after that surgery, I started to have chest pains in January.  The pre-op nurse told me I could not have surgery with Dr. Milinda Pointer because I was having chest pains and that I needed to get cleared by a Cardiologist because I was having chest pains.  So I made an appointment with my primary care and explain to her what was going on.  She referred me to a Cardiologist.  I have an appointment on the 17th of April at 1:20 pm.  So I wanted to reschedule my appointment for tomorrow because I have not been cleared by a Cardiologist to have the surgery.  Please call me back and guide me as to what needs to be done to try and get my foot worked on because it's really starting to bother me."  I am returning your call.  How can I help you?  "I wanted to know what my next step is.  I have my appointment scheduled to see the Cardiologist."  I can tentatively schedule you for a surgery date.  However, we will require a written statement from the Cardiologist stating you are cleared for the procedure.  "Okay, that will be fine."  Do you have a date in mind?  "Yes, I'd like to do it as soon as possible because my foot is killing me."  We can schedule you for May 31 or any Friday in June except for June 21.  "What's the Friday after June 8?"  That date is June 14.  "Okay, schedule me for then.  Someone will give me a call closer to that date correct?"  Yes, someone from the surgical center will call you with the arrival time a day or two before your surgery date.  They will give you your arrival time.

## 2017-06-02 ENCOUNTER — Encounter: Payer: Self-pay | Admitting: Cardiology

## 2017-06-02 ENCOUNTER — Ambulatory Visit (INDEPENDENT_AMBULATORY_CARE_PROVIDER_SITE_OTHER): Admitting: Cardiology

## 2017-06-02 VITALS — BP 130/70 | HR 126 | Ht 65.0 in | Wt 134.4 lb

## 2017-06-02 DIAGNOSIS — I10 Essential (primary) hypertension: Secondary | ICD-10-CM

## 2017-06-02 DIAGNOSIS — E781 Pure hyperglyceridemia: Secondary | ICD-10-CM | POA: Diagnosis not present

## 2017-06-02 DIAGNOSIS — E119 Type 2 diabetes mellitus without complications: Secondary | ICD-10-CM | POA: Diagnosis not present

## 2017-06-02 DIAGNOSIS — E559 Vitamin D deficiency, unspecified: Secondary | ICD-10-CM | POA: Diagnosis not present

## 2017-06-02 NOTE — Patient Instructions (Addendum)
Medication Instructions:  Your physician recommends that you continue on your current medications as directed. Please refer to the Current Medication list given to you today.   Labwork: Your physician recommends that you return for lab work today: BMP.   Testing/Procedures: You had an EKG today.   Your physician has requested that you have a renal artery duplex. During this test, an ultrasound is used to evaluate blood flow to the kidneys. Allow one hour for this exam. Do not eat after midnight the day before and avoid carbonated beverages. Take your medications as you usually do.  Your physician has requested that you have an echocardiogram. Echocardiography is a painless test that uses sound waves to create images of your heart. It provides your doctor with information about the size and shape of your heart and how well your heart's chambers and valves are working. This procedure takes approximately one hour. There are no restrictions for this procedure.   Follow-Up: Your physician recommends that you schedule a follow-up appointment in: 1 month.  Any Other Special Instructions Will Be Listed Below (If Applicable).  Please purchase a blood pressure machine to monitor your blood pressure. Keep a log of your blood pressures and bring it to your next appointment.    If you need a refill on your cardiac medications before your next appointment, please call your pharmacy.
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
## 2017-06-03 ENCOUNTER — Encounter

## 2017-06-03 LAB — BASIC METABOLIC PANEL
BUN / CREAT RATIO: 20 (ref 9–23)
BUN: 27 mg/dL — AB (ref 6–24)
CALCIUM: 10.2 mg/dL (ref 8.7–10.2)
CHLORIDE: 109 mmol/L — AB (ref 96–106)
CO2: 21 mmol/L (ref 20–29)
CREATININE: 1.37 mg/dL — AB (ref 0.57–1.00)
GFR calc Af Amer: 50 mL/min/{1.73_m2} — ABNORMAL LOW (ref >59)
GFR calc non Af Amer: 44 mL/min/{1.73_m2} — ABNORMAL LOW (ref >59)
GLUCOSE: 91 mg/dL (ref 65–99)
Potassium: 5.1 mmol/L (ref 3.5–5.2)
Sodium: 146 mmol/L — ABNORMAL HIGH (ref 134–144)

## 2017-06-24 ENCOUNTER — Ambulatory Visit: Admitting: Nurse Practitioner

## 2017-06-24 DIAGNOSIS — Z0289 Encounter for other administrative examinations: Secondary | ICD-10-CM

## 2017-07-05 ENCOUNTER — Telehealth: Payer: Self-pay | Admitting: Nurse Practitioner

## 2017-07-05 DIAGNOSIS — K219 Gastro-esophageal reflux disease without esophagitis: Secondary | ICD-10-CM

## 2017-07-05 DIAGNOSIS — F419 Anxiety disorder, unspecified: Secondary | ICD-10-CM

## 2017-07-05 DIAGNOSIS — F329 Major depressive disorder, single episode, unspecified: Secondary | ICD-10-CM

## 2017-07-05 DIAGNOSIS — G629 Polyneuropathy, unspecified: Secondary | ICD-10-CM

## 2017-07-05 DIAGNOSIS — E119 Type 2 diabetes mellitus without complications: Secondary | ICD-10-CM

## 2017-07-05 DIAGNOSIS — I10 Essential (primary) hypertension: Secondary | ICD-10-CM

## 2017-07-05 MED ORDER — BUPROPION HCL 75 MG PO TABS: 75.0000 mg | ORAL_TABLET | Freq: Two times a day (BID) | ORAL | 7 refills | Status: DC

## 2017-07-05 MED ORDER — SERTRALINE HCL 100 MG PO TABS: 100.0000 mg | ORAL_TABLET | Freq: Every day | ORAL | 7 refills | Status: DC

## 2017-07-05 MED ORDER — GABAPENTIN 300 MG PO CAPS: 300.0000 mg | ORAL_CAPSULE | Freq: Every day | ORAL | 7 refills | Status: DC

## 2017-07-05 MED ORDER — LOSARTAN POTASSIUM 100 MG PO TABS: 100.0000 mg | ORAL_TABLET | Freq: Every day | ORAL | 7 refills | Status: DC

## 2017-07-05 MED ORDER — AMLODIPINE BESYLATE 10 MG PO TABS: 10.0000 mg | ORAL_TABLET | Freq: Every day | ORAL | 7 refills | Status: DC

## 2017-07-05 MED ORDER — TRIAMTERENE-HCTZ 37.5-25 MG PO TABS
1.0000 | ORAL_TABLET | Freq: Every day | ORAL | 1 refills | Status: DC
Start: 2017-07-05 — End: 2017-11-10

## 2017-07-05 MED ORDER — ESOMEPRAZOLE MAGNESIUM 20 MG PO CPDR: 20.0000 mg | DELAYED_RELEASE_CAPSULE | Freq: Every day | ORAL | 1 refills | Status: DC

## 2017-07-05 NOTE — Telephone Encounter (Signed)
I have resent all medications. Please advise on ambien and ativan. They will need to be printed and faxed.

## 2017-07-05 NOTE — Telephone Encounter (Signed)
Copied from [place] [phone]. Topic: Quick Communication - See Telephone Encounter >> Jul 05, 2017 11:56 AM Conception Chancy, NT wrote: CRM for notification. See Telephone encounter for: 07/05/17.  Patient is calling and states that all of her RX that was sent to [facility] on 05/10/17 they are saying they do not have. She is needing those resent. Patient also states that Ativan and Ambien will have to be faxed and not escribed due to the way there system works. But the other medications can be escribed. Please advise.

## 2017-07-06 ENCOUNTER — Encounter: Payer: Self-pay | Admitting: Gastroenterology

## 2017-07-07 ENCOUNTER — Ambulatory Visit (HOSPITAL_BASED_OUTPATIENT_CLINIC_OR_DEPARTMENT_OTHER): Admission: RE | Admit: 2017-07-07 | Source: Ambulatory Visit

## 2017-07-07 ENCOUNTER — Other Ambulatory Visit (HOSPITAL_BASED_OUTPATIENT_CLINIC_OR_DEPARTMENT_OTHER)

## 2017-07-07 ENCOUNTER — Telehealth: Payer: Self-pay | Admitting: Cardiology

## 2017-07-07 NOTE — Telephone Encounter (Signed)
Can we handle this together tomorrow? Just want to make sure we are not duplicating any controlled substance Rxs. Thanks!

## 2017-07-07 NOTE — Telephone Encounter (Signed)
Patient is scheduled for Renal and Echo this am at [place] and patient has noshowed the Renal appointment. Pending the echo stillat 9:15..Just FYI

## 2017-07-21 ENCOUNTER — Ambulatory Visit: Admitting: Cardiology

## 2017-07-21 ENCOUNTER — Ambulatory Visit: Payer: Self-pay

## 2017-07-21 ENCOUNTER — Ambulatory Visit (INDEPENDENT_AMBULATORY_CARE_PROVIDER_SITE_OTHER)
Admission: RE | Admit: 2017-07-21 | Discharge: 2017-07-21 | Disposition: A | Discharge status: Home / Self Care | Source: Ambulatory Visit | Attending: Family | Admitting: Family

## 2017-07-21 ENCOUNTER — Ambulatory Visit (INDEPENDENT_AMBULATORY_CARE_PROVIDER_SITE_OTHER): Admitting: Family

## 2017-07-21 ENCOUNTER — Encounter: Payer: Self-pay | Admitting: Family

## 2017-07-21 VITALS — BP 170/90 | HR 75 | Temp 98.7°F | Ht 65.0 in | Wt 137.1 lb

## 2017-07-21 DIAGNOSIS — R6889 Other general symptoms and signs: Secondary | ICD-10-CM

## 2017-07-21 DIAGNOSIS — R05 Cough: Secondary | ICD-10-CM

## 2017-07-21 DIAGNOSIS — J449 Chronic obstructive pulmonary disease, unspecified: Secondary | ICD-10-CM

## 2017-07-21 DIAGNOSIS — R059 Cough, unspecified: Secondary | ICD-10-CM

## 2017-07-21 DIAGNOSIS — R0989 Other specified symptoms and signs involving the circulatory and respiratory systems: Secondary | ICD-10-CM

## 2017-07-21 LAB — POC INFLUENZA A&B (BINAX/QUICKVUE)
Influenza A, POC: NEGATIVE
Influenza B, POC: NEGATIVE

## 2017-07-21 MED ORDER — BENZONATATE 100 MG PO CAPS: 100.0000 mg | ORAL_CAPSULE | Freq: Three times a day (TID) | ORAL | 0 refills | Status: DC | PRN

## 2017-07-21 MED ORDER — DOXYCYCLINE HYCLATE 100 MG PO TABS: 100.0000 mg | ORAL_TABLET | Freq: Two times a day (BID) | ORAL | 0 refills | Status: DC
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
## 2017-07-21 NOTE — Telephone Encounter (Signed)
Phone call to pt. To discuss her symptoms.  Reported nonproductive cough for a couple of days;  now has worsened.  Reported cough is frequent and persistent.  Stated she has noticed "rattling in chest and some wheezing since yesterday."  Has some nasal congestion; stated she thought this was related to the pollen.  C/o chills, but hasn't checked temperature.  Stated she has intermittent chest pain but denied chest pain at this time.  Pt. is tearful, and stated she has to walk to the bus, for transportation.  Had back surgery in December, and needs foot surgery, so it is very hard to get around.  Stated she suffers from anxiety, and is feeling very overwhelmed with all the appts., that she needs to get to.  Advised due to worsening cough, chills, and chest congestion, she will need to be eval. In office.  Pt. stated she can call a cab to take her to an appt.  Appt. Given today at 3:40 PM at PCP office.  Advised if becomes SOB or having chest pain, to call 911.  Pt. Verb. Understanding.  Agrees with plan.           Reason for Disposition . SEVERE coughing spells (e.g., whooping sound after coughing, vomiting after coughing)  Answer Assessment - Initial Assessment Questions 1. ONSET: "When did the cough begin?"      A couple days ago  2. SEVERITY: "How bad is the cough today?"      Frequent and persistent; worsening 3. RESPIRATORY DISTRESS: "Describe your breathing."      Intermittent shortness of breath 4. FEVER: "Do you have a fever?" If so, ask: "What is your temperature, how was it measured, and when did it start?"     chills 5. HEMOPTYSIS: "Are you coughing up any blood?" If so ask: "How much?" (flecks, streaks, tablespoons, etc.)     Cough is dry  6. TREATMENT: "What have you done so far to treat the cough?" (e.g., meds, fluids, humidifier)     Has not tried any home treatment  7. CARDIAC HISTORY: "Do you have any history of heart disease?" (e.g., heart attack, congestive heart failure)   Hx of mitral valve prolapse in late teens 8. LUNG HISTORY: "Do you have any history of lung disease?"  (e.g., pulmonary embolus, asthma, emphysema)     Hx of bronchitis; denied asthma or COPD 9. PE RISK FACTORS: "Do you have a history of blood clots?" (or: recent major surgery, recent prolonged travel, bedridden )     No recent surgery or prolonged travel  10. OTHER SYMPTOMS: "Do you have any other symptoms? (e.g., runny nose, wheezing, chest pain)      Rattling in chest and wheeze with breathing since yesterday; some nasal congestion; denied shortness of breath 11. PREGNANCY: "Is there any chance you are pregnant?" "When was your last menstrual period?"       No menstrual cycle since 2006   12. TRAVEL: "Have you traveled out of the country in the last month?" (e.g., travel history, exposures)       No  Protocols used: COUGH - ACUTE NON-PRODUCTIVE-A-AH  Message from Nils Flack sent at 07/21/2017 10:44 AM EDT   Summary: cough    Pt is calling for a nurse call - she thinks bronchitis is back. She can not get to provider because of back pain and can not get around. Her inhalers are not helping. Denies sob and chest pain. [phone] She would like to know what to  do next.

## 2017-07-22 ENCOUNTER — Encounter: Payer: Self-pay | Admitting: Cardiology

## 2017-07-22 ENCOUNTER — Telehealth: Payer: Self-pay | Admitting: *Deleted

## 2017-07-22 NOTE — Telephone Encounter (Signed)
[name] sent an email, informing me that [name] has Bronchitis, a herniated disc and that she has not completed her cardiac testing that her Cardiologist requested due to her condition.  She wants to put off the surgery for now until she can get everything under control.

## 2017-08-05 ENCOUNTER — Other Ambulatory Visit

## 2017-08-09 ENCOUNTER — Telehealth: Payer: Self-pay | Admitting: *Deleted

## 2017-08-09 NOTE — Telephone Encounter (Signed)
"  I was supposed to have surgery a couple of weeks ago.  I had to cancel it because I was having some issues with my heart.  I'm scheduled to see my Cardiologist soon.  My foot is still bothering me.  I still want to have surgery on it.  What do I need to do?"  We need the medical clearance from your Cardiologist.  We will not be able to get you scheduled for surgery without the medical clearance.  Have your doctor send [place] a letter stating you are cleared to have surgery.  Once we receive that information, we can get you scheduled.  "Do I need to tell the doctor anything or will he know to let you know?"  Ask him to let us know.  If he uses Epic, he can send a message via Epic.  If not, he can fax it to [place].  "Okay, thank you so much for your help.

## 2017-08-10 ENCOUNTER — Encounter: Payer: Self-pay | Admitting: Nurse Practitioner

## 2017-08-10 ENCOUNTER — Other Ambulatory Visit (INDEPENDENT_AMBULATORY_CARE_PROVIDER_SITE_OTHER)

## 2017-08-10 ENCOUNTER — Ambulatory Visit (INDEPENDENT_AMBULATORY_CARE_PROVIDER_SITE_OTHER): Admitting: Nurse Practitioner

## 2017-08-10 VITALS — BP 128/70 | HR 94 | Temp 99.2°F | Resp 16 | Ht 65.0 in | Wt 134.8 lb

## 2017-08-10 DIAGNOSIS — G629 Polyneuropathy, unspecified: Secondary | ICD-10-CM

## 2017-08-10 DIAGNOSIS — R899 Unspecified abnormal finding in specimens from other organs, systems and tissues: Secondary | ICD-10-CM | POA: Diagnosis not present

## 2017-08-10 DIAGNOSIS — I10 Essential (primary) hypertension: Secondary | ICD-10-CM

## 2017-08-10 DIAGNOSIS — K219 Gastro-esophageal reflux disease without esophagitis: Secondary | ICD-10-CM

## 2017-08-10 DIAGNOSIS — E559 Vitamin D deficiency, unspecified: Secondary | ICD-10-CM

## 2017-08-10 DIAGNOSIS — G4700 Insomnia, unspecified: Secondary | ICD-10-CM | POA: Diagnosis not present

## 2017-08-10 LAB — BASIC METABOLIC PANEL
BUN: 18 mg/dL (ref 6–23)
CALCIUM: 9.8 mg/dL (ref 8.4–10.5)
CHLORIDE: 105 meq/L (ref 96–112)
CO2: 27 mEq/L (ref 19–32)
Creatinine, Ser: 1.02 mg/dL (ref 0.40–1.20)
GFR: 72.48 mL/min (ref >60.00)
GLUCOSE: 103 mg/dL — AB (ref 70–99)
POTASSIUM: 4.3 meq/L (ref 3.5–5.1)
SODIUM: 139 meq/L (ref 135–145)

## 2017-08-10 LAB — VITAMIN D 25 HYDROXY (VIT D DEFICIENCY, FRACTURES): VITD: 39.26 ng/mL (ref 30.00–100.00)

## 2017-08-10 MED ORDER — ZOLPIDEM TARTRATE 10 MG PO TABS: 10.0000 mg | ORAL_TABLET | Freq: Every evening | ORAL | 1 refills | Status: DC | PRN
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
## 2017-08-10 NOTE — Patient Instructions (Addendum)
Please head downstairs for lab work/x-rays. If any of your test results are critically abnormal, you will be contacted right away. Otherwise, I will contact you within a week about your test results and any recommendations for abnormalities.  Please keep your upcoming follow up appointments  We will decide when you should come back and see me when your labs return We should definitely meet back in 3 months for an A1c follow up.

## 2017-08-12 ENCOUNTER — Encounter

## 2017-08-12 ENCOUNTER — Ambulatory Visit (INDEPENDENT_AMBULATORY_CARE_PROVIDER_SITE_OTHER): Admitting: Pulmonary Disease

## 2017-08-12 ENCOUNTER — Encounter: Payer: Self-pay | Admitting: Pulmonary Disease

## 2017-08-12 VITALS — BP 122/80 | HR 94 | Ht 65.0 in | Wt 134.0 lb

## 2017-08-12 DIAGNOSIS — J449 Chronic obstructive pulmonary disease, unspecified: Secondary | ICD-10-CM

## 2017-08-12 DIAGNOSIS — J411 Mucopurulent chronic bronchitis: Secondary | ICD-10-CM

## 2017-08-12 DIAGNOSIS — F17200 Nicotine dependence, unspecified, uncomplicated: Secondary | ICD-10-CM | POA: Diagnosis not present

## 2017-08-12 NOTE — Progress Notes (Signed)
This encounter was created in error - please disregard.
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
## 2017-08-12 NOTE — Assessment & Plan Note (Signed)
Main intervention is that you have to quit smoking!. Surprisingly spirometry does not show significant airway obstruction, does show moderate restriction  We will hold off on maintenance bronchodilator  Use albuterol 2 puffs every 6 hours as needed  Call your PCP hardly when you have signs of bronchitis-chest cold, yellow-green sputum or wheezing  She does not seem to have other triggers such as sinusitis or reflux

## 2017-08-12 NOTE — Assessment & Plan Note (Signed)
Smoking cessation was emphasized as the main intervention here today. She will make a quit attempt and get started on nicotine patch. Not a good candidate for Chantix due to history of depression

## 2017-08-12 NOTE — Patient Instructions (Signed)
Main intervention is that you have to quit smoking!.  Use albuterol 2 puffs every 6 hours as needed  Call your PCP hardly when you have signs of bronchitis-chest cold, yellow-green sputum or wheezing

## 2017-08-16 DIAGNOSIS — D126 Benign neoplasm of colon, unspecified: Secondary | ICD-10-CM

## 2017-08-16 HISTORY — DX: Benign neoplasm of colon, unspecified: D12.6

## 2017-08-27 ENCOUNTER — Encounter: Payer: Self-pay | Admitting: *Deleted

## 2017-08-27 ENCOUNTER — Ambulatory Visit (HOSPITAL_BASED_OUTPATIENT_CLINIC_OR_DEPARTMENT_OTHER)
Admission: RE | Admit: 2017-08-27 | Discharge: 2017-08-27 | Disposition: A | Discharge status: Home / Self Care | Source: Ambulatory Visit | Attending: Cardiology | Admitting: Cardiology

## 2017-08-27 ENCOUNTER — Telehealth: Payer: Self-pay | Admitting: *Deleted

## 2017-08-27 ENCOUNTER — Encounter (INDEPENDENT_AMBULATORY_CARE_PROVIDER_SITE_OTHER): Payer: Self-pay

## 2017-08-27 DIAGNOSIS — E781 Pure hyperglyceridemia: Secondary | ICD-10-CM

## 2017-08-27 DIAGNOSIS — I10 Essential (primary) hypertension: Secondary | ICD-10-CM | POA: Diagnosis not present

## 2017-08-27 DIAGNOSIS — I071 Rheumatic tricuspid insufficiency: Secondary | ICD-10-CM | POA: Insufficient documentation

## 2017-08-27 DIAGNOSIS — E785 Hyperlipidemia, unspecified: Secondary | ICD-10-CM | POA: Diagnosis not present

## 2017-08-27 DIAGNOSIS — E559 Vitamin D deficiency, unspecified: Secondary | ICD-10-CM | POA: Insufficient documentation

## 2017-08-27 DIAGNOSIS — E119 Type 2 diabetes mellitus without complications: Secondary | ICD-10-CM | POA: Insufficient documentation

## 2017-08-27 DIAGNOSIS — J449 Chronic obstructive pulmonary disease, unspecified: Secondary | ICD-10-CM | POA: Diagnosis not present

## 2017-08-27 NOTE — Progress Notes (Signed)
  Echocardiogram 2D Echocardiogram has been performed.  [name] [name] 08/27/2017, 11:10 AM

## 2017-08-27 NOTE — Telephone Encounter (Signed)
Called to clarify which location of [place] patient needed echocardiogram results sent to. Results have been faxed to the [facility] location per patient request and advised patient to call that office on Monday and make sure this information was received. Informed her to call our office if anything else is needed to proceed with surgery. Patient verbalized understanding. No further questions.

## 2017-08-27 NOTE — Progress Notes (Signed)
VAS US RENAL ARTERY DUPLEX BILATERAL   Renal: Right: Normal size right kidney. No evidence of right renal artery stenosis. Left:Normal size of left kidney. No evidence of left renal artery stenosis.    08/27/17 [name]

## 2017-08-30 ENCOUNTER — Telehealth: Payer: Self-pay | Admitting: *Deleted

## 2017-08-30 ENCOUNTER — Encounter (INDEPENDENT_AMBULATORY_CARE_PROVIDER_SITE_OTHER): Payer: Self-pay

## 2017-08-30 NOTE — Telephone Encounter (Signed)
"  My name is [name].  I don't know the procedure about what I'm supposed to do.  I was supposed to have had surgery before but I had to cancel it because of my heart.  I was told I would have to get cleared by my heart doctor.  Everything is good.  They were supposed to have sent you a letter.  They said for me to let them know if you have not received it yet."  I have not received anything.  "So once you get that, where do we go from there?"  We can get you scheduled.  I'll send the clearance letter to the surgical center.  "So, someone will call me to set up a date?"  Yes, I'll give you a call.  "Okay, thank you."

## 2017-08-31 ENCOUNTER — Ambulatory Visit (AMBULATORY_SURGERY_CENTER): Payer: Self-pay

## 2017-08-31 VITALS — Ht 65.0 in | Wt 137.6 lb

## 2017-08-31 DIAGNOSIS — Z1211 Encounter for screening for malignant neoplasm of colon: Secondary | ICD-10-CM

## 2017-08-31 MED ORDER — MOVIPREP 100 G PO SOLR: 1.0000 | Freq: Once | ORAL | 0 refills | Status: AC

## 2017-08-31 NOTE — Progress Notes (Signed)
Per pt, no allergies to soy or egg products.Pt not taking any weight loss meds or using  O2 at home.  Pt refused emmi video. 

## 2017-09-01 ENCOUNTER — Ambulatory Visit (INDEPENDENT_AMBULATORY_CARE_PROVIDER_SITE_OTHER): Admitting: Cardiology

## 2017-09-01 ENCOUNTER — Encounter: Payer: Self-pay | Admitting: Cardiology

## 2017-09-01 VITALS — BP 112/70 | HR 74 | Ht 65.0 in | Wt 134.0 lb

## 2017-09-01 DIAGNOSIS — E119 Type 2 diabetes mellitus without complications: Secondary | ICD-10-CM | POA: Diagnosis not present

## 2017-09-01 DIAGNOSIS — I10 Essential (primary) hypertension: Secondary | ICD-10-CM | POA: Diagnosis not present

## 2017-09-01 DIAGNOSIS — R002 Palpitations: Secondary | ICD-10-CM

## 2017-09-01 DIAGNOSIS — E781 Pure hyperglyceridemia: Secondary | ICD-10-CM | POA: Diagnosis not present
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
## 2017-09-01 NOTE — Patient Instructions (Signed)
Medication Instructions: Your physician recommends that you continue on your current medications as directed. Please refer to the Current Medication list given to you today.   Labwork: None.  Testing/Procedures: Your physician has recommended that you wear a holter monitor. Holter monitors are medical devices that record the heart's electrical activity. Doctors most often use these monitors to diagnose arrhythmias. Arrhythmias are problems with the speed or rhythm of the heartbeat. The monitor is a small, portable device. You can wear one while you do your normal daily activities. This is usually used to diagnose what is causing palpitations/syncope (passing out). Wear for 7 days.   Follow-Up: Your physician recommends that you schedule a follow-up appointment in: 2 months.  Any Other Special Instructions Will Be Listed Below (If Applicable).    Holter Monitoring A Holter monitor is a small device that is used to detect abnormal heart rhythms. It clips to your clothing and is connected by wires to flat, sticky disks (electrodes) that attach to your chest. It is worn continuously for 24-48 hours. Follow these instructions at home:  Wear your Holter monitor at all times, even while exercising and sleeping, for as long as directed by your health care provider.  Make sure that the Holter monitor is safely clipped to your clothing or close to your body as recommended by your health care provider.  Do not get the monitor or wires wet.  Do not put body lotion or moisturizer on your chest.  Keep your skin clean.  Keep a diary of your daily activities, such as walking and doing chores. If you feel that your heartbeat is abnormal or that your heart is fluttering or skipping a beat: ? Record what you are doing when it happens. ? Record what time of day the symptoms occur.  Return your Holter monitor as directed by your health care provider.  Keep all follow-up visits as directed by your  health care provider. This is important. Get help right away if:  You feel lightheaded or you faint.  You have trouble breathing.  You feel pain in your chest, upper arm, or jaw.  You feel sick to your stomach and your skin is pale, cool, or damp.  You heartbeat feels unusual or abnormal. This information is not intended to replace advice given to you by your health care provider. Make sure you discuss any questions you have with your health care provider. Document Released: 11/01/2003 Document Revised: 07/11/2015 Document Reviewed: 09/11/2013 Elsevier Interactive Patient Education  [employer].     If you need a refill on your cardiac medications before your next appointment, please call your pharmacy.

## 2017-09-02 ENCOUNTER — Ambulatory Visit

## 2017-09-02 DIAGNOSIS — R002 Palpitations: Secondary | ICD-10-CM | POA: Diagnosis not present

## 2017-09-09 NOTE — Telephone Encounter (Signed)
I left a message for [name] asking her to give me a call back about scheduling her appointment.

## 2017-09-13 ENCOUNTER — Telehealth: Payer: Self-pay | Admitting: Gastroenterology

## 2017-09-13 NOTE — Telephone Encounter (Signed)
Spoke with the patient. She states she vomit some of the prep up after she drunk the first 2 glasses this morning. She was able to get down the last two glasses without vomiting. She states she is having yellow liquid stools at this time. I told patient is may go ahead and mix the moviprep and drank half of the second dose tonight at 6 pm then finish the other half in the morning.pt encouraged to push fluids also. Pt understands.

## 2017-09-14 ENCOUNTER — Encounter: Payer: Self-pay | Admitting: Gastroenterology

## 2017-09-14 ENCOUNTER — Ambulatory Visit (AMBULATORY_SURGERY_CENTER): Payer: Medicare Other | Admitting: Gastroenterology

## 2017-09-14 VITALS — BP 109/63 | HR 63 | Temp 98.0°F | Resp 12 | Ht 65.0 in | Wt 134.0 lb

## 2017-09-14 DIAGNOSIS — D125 Benign neoplasm of sigmoid colon: Secondary | ICD-10-CM

## 2017-09-14 DIAGNOSIS — D123 Benign neoplasm of transverse colon: Secondary | ICD-10-CM

## 2017-09-14 DIAGNOSIS — D124 Benign neoplasm of descending colon: Secondary | ICD-10-CM

## 2017-09-14 DIAGNOSIS — Z1211 Encounter for screening for malignant neoplasm of colon: Secondary | ICD-10-CM

## 2017-09-14 DIAGNOSIS — K635 Polyp of colon: Secondary | ICD-10-CM

## 2017-09-14 MED ORDER — SODIUM CHLORIDE 0.9 % IV SOLN: 500.0000 mL | Freq: Once | INTRAVENOUS | Status: DC

## 2017-09-14 NOTE — Progress Notes (Signed)
Called to room to assist during endoscopic procedure.  Patient ID and intended procedure confirmed with present staff. Received instructions for my participation in the procedure from the performing physician.  

## 2017-09-14 NOTE — Op Note (Signed)
[place] Patient Name: [name] Procedure Date: 09/14/2017 11:04 AM MRN: [id number] Endoscopist: Ladene Artist , MD Age: 57 Referring MD:  Date of Birth: 1960/06/20 Gender: Female Account #: [id number] Procedure:                Colonoscopy Indications:              Screening for colorectal malignant neoplasm Medicines:                Monitored Anesthesia Care Procedure:                Pre-Anesthesia Assessment:                           - Prior to the procedure, a History and Physical                            was performed, and patient medications and                            allergies were reviewed. The patient's tolerance of                            previous anesthesia was also reviewed. The risks                            and benefits of the procedure and the sedation                            options and risks were discussed with the patient.                            All questions were answered, and informed consent                            was obtained. Prior Anticoagulants: The patient has                            taken no previous anticoagulant or antiplatelet                            agents. ASA Grade Assessment: III - A patient with                            severe systemic disease. After reviewing the risks                            and benefits, the patient was deemed in                            satisfactory condition to undergo the procedure.                           After obtaining informed consent, the colonoscope  was passed under direct vision. Throughout the                            procedure, the patient's blood pressure, pulse, and                            oxygen saturations were monitored continuously. The                            Model PCF-H190DL [id number]) scope was introduced                            through the anus and advanced to the the cecum,   identified by appendiceal orifice and ileocecal                            valve. The ileocecal valve, appendiceal orifice,                            and rectum were photographed. The IC valve photo                            did not capture. The quality of the bowel                            preparation was good. The colonoscopy was performed                            without difficulty. The patient tolerated the                            procedure well. Scope In: 11:11:31 AM Scope Out: 11:31:37 AM Scope Withdrawal Time: 0 hours 15 minutes 37 seconds  Total Procedure Duration: 0 hours 20 minutes 6 seconds  Findings:                 The perianal and digital rectal examinations were                            normal.                           Four sessile polyps were found in the sigmoid colon                            (2) and transverse colon (2). The polyps were 5 to                            7 mm in size. These polyps were removed with a cold                            snare. Resection and retrieval were complete.                           A  4 mm polyp was found in the descending colon. The                            polyp was sessile. The polyp was removed with a                            cold biopsy forceps. Resection and retrieval were                            complete.                           Internal hemorrhoids were found during                            retroflexion. The hemorrhoids were small and Grade                            I (internal hemorrhoids that do not prolapse).                           The exam was otherwise without abnormality on                            direct and retroflexion views. Complications:            No immediate complications. Estimated blood loss:                            None. Estimated Blood Loss:     Estimated blood loss: none. Impression:               - Four 5 to 7 mm polyps in the sigmoid colon and in                             the transverse colon, removed with a cold snare.                            Resected and retrieved.                           - One 4 mm polyp in the descending colon, removed                            with a cold biopsy forceps. Resected and retrieved.                           - Internal hemorrhoids.                           - The examination was otherwise normal on direct                            and retroflexion views. Recommendation:           - Repeat colonoscopy  in 3 - 5 years for                            surveillance pending pathology review.                           - Patient has a contact number available for                            emergencies. The signs and symptoms of potential                            delayed complications were discussed with the                            patient. Return to normal activities tomorrow.                            Written discharge instructions were provided to the                            patient.                           - Resume previous diet.                           - Continue present medications.                           - Await pathology results. Ladene Artist, MD 09/14/2017 11:35:50 AM This report has been signed electronically.

## 2017-09-14 NOTE — Progress Notes (Signed)
Pt states she cannot take out lip piercing.  No changes in medical or surgical hx since PV per pt

## 2017-09-14 NOTE — Patient Instructions (Signed)
Handouts:  Hemorrhoids and Polyps  YOU HAD AN ENDOSCOPIC PROCEDURE TODAY AT [facility]:   Refer to the procedure report that was given to you for any specific questions about what was found during the examination.  If the procedure report does not answer your questions, please call your gastroenterologist to clarify.  If you requested that your care partner not be given the details of your procedure findings, then the procedure report has been included in a sealed envelope for you to review at your convenience later.  YOU SHOULD EXPECT: Some feelings of bloating in the abdomen. Passage of more gas than usual.  Walking can help get rid of the air that was put into your GI tract during the procedure and reduce the bloating. If you had a lower endoscopy (such as a colonoscopy or flexible sigmoidoscopy) you may notice spotting of blood in your stool or on the toilet paper. If you underwent a bowel prep for your procedure, you may not have a normal bowel movement for a few days.  Please Note:  You might notice some irritation and congestion in your nose or some drainage.  This is from the oxygen used during your procedure.  There is no need for concern and it should clear up in a day or so.  SYMPTOMS TO REPORT IMMEDIATELY:   Following lower endoscopy (colonoscopy or flexible sigmoidoscopy):  Excessive amounts of blood in the stool  Significant tenderness or worsening of abdominal pains  Swelling of the abdomen that is new, acute  Fever of 100F or higher   For urgent or emergent issues, a gastroenterologist can be reached at any hour by calling [phone].   DIET:  We do recommend a small meal at first, but then you may proceed to your regular diet.  Drink plenty of fluids but you should avoid alcoholic beverages for 24 hours.  ACTIVITY:  You should plan to take it easy for the rest of today and you should NOT DRIVE or use heavy machinery until tomorrow (because of the  sedation medicines used during the test).    FOLLOW UP: Our staff will call the number listed on your records the next business day following your procedure to check on you and address any questions or concerns that you may have regarding the information given to you following your procedure. If we do not reach you, we will leave a message.  However, if you are feeling well and you are not experiencing any problems, there is no need to return our call.  We will assume that you have returned to your regular daily activities without incident.  If any biopsies were taken you will be contacted by phone or by letter within the next 1-3 weeks.  Please call us at [phone] if you have not heard about the biopsies in 3 weeks.    SIGNATURES/CONFIDENTIALITY: You and/or your care partner have signed paperwork which will be entered into your electronic medical record.  These signatures attest to the fact that that the information above on your After Visit Summary has been reviewed and is understood.  Full responsibility of the confidentiality of this discharge information lies with you and/or your care-partner.

## 2017-09-14 NOTE — Progress Notes (Signed)
Spontaneous respirations throughout. VSS. Resting comfortably. To PACU on room air. Report to  RN. 

## 2017-09-15 ENCOUNTER — Telehealth: Payer: Self-pay | Admitting: *Deleted

## 2017-09-15 NOTE — Telephone Encounter (Signed)
1`` Follow up Call-  Call back number 09/14/2017  Post procedure Call Back phone  # [phone]  Permission to leave phone message Yes     Patient questions:  Do you have a fever, pain , or abdominal swelling? No. Pain Score  0 *  Have you tolerated food without any problems? Yes.    Have you been able to return to your normal activities? Yes.    Do you have any questions about your discharge instructions: Diet   No. Medications  No. Follow up visit  No.  Do you have questions or concerns about your Care? No.  Actions: * If pain score is 4 or above: No action needed, pain <4.

## 2017-09-16 ENCOUNTER — Encounter: Payer: Self-pay | Admitting: Gastroenterology

## 2017-09-21 ENCOUNTER — Encounter: Payer: Self-pay | Admitting: Nurse Practitioner

## 2017-09-21 NOTE — Assessment & Plan Note (Signed)
Update labs F/U with further recommendations pending lab results - VITAMIN D 25 Hydroxy (Vit-D Deficiency, Fractures); Future 

## 2017-09-21 NOTE — Assessment & Plan Note (Addendum)
Continue ambien, 90 day supply with 1 refill sent per patient request Follow up for new, worsening symptoms Controlled substance registry reviewed with no irregularities. - zolpidem (AMBIEN) 10 MG tablet; Take 1 tablet (10 mg total) by mouth at bedtime as needed for sleep.  Dispense: 90 tablet; Refill: 1

## 2017-09-21 NOTE — Assessment & Plan Note (Signed)
Stable on nexium, continue current dosage Follow up for new, worsening symptoms

## 2017-09-21 NOTE — Assessment & Plan Note (Signed)
Stable Continue neurontin at current dosage Follow up for new, worsening symptoms

## 2017-09-21 NOTE — Assessment & Plan Note (Signed)
BP reading below goal on current medications, will continue current dosages Continue to monitor Continue to follow up with cardiology as requested - Basic metabolic panel; Future

## 2017-09-24 ENCOUNTER — Encounter (INDEPENDENT_AMBULATORY_CARE_PROVIDER_SITE_OTHER): Payer: Self-pay | Admitting: Neurology

## 2017-09-24 ENCOUNTER — Ambulatory Visit (INDEPENDENT_AMBULATORY_CARE_PROVIDER_SITE_OTHER): Payer: Medicare Other | Admitting: Neurology

## 2017-09-24 DIAGNOSIS — M79605 Pain in left leg: Secondary | ICD-10-CM | POA: Diagnosis not present

## 2017-09-24 DIAGNOSIS — Z0289 Encounter for other administrative examinations: Secondary | ICD-10-CM

## 2017-09-24 NOTE — Procedures (Signed)
Full Name: [name] Gender: Female MRN #: [id number] Date of Birth: October 10, 1960    Visit Date: 09/24/2017 10:33 Age: 57 Years 38 Months Old Examining Physician: Marcial Pacas, MD  Referring Physician: Susa Day, MD History: 57 year old female, history of lumbar decompression surgery for left lumbar radiculopathy in December 2018, presenting with similar recurrent symptoms of left-sided low back pain, radiating pain to left buttock, lateral leg, calf region, she also had a persistent sensory loss in the left lateral lower leg following left leg spider bite tissue damage and surgery.  Summary of the tests:  Nerve conduction study: Right sural, bilateral superficial peroneal sensory responses were normal.  Left sural sensory response was absent. Bilateral tibial, right peroneal to EDB motor responses were normal.  Left peroneal to EDB motor response showed mildly decreased the C map amplitude, with normal distal latency, conduction velocity.  Electromyography: Selective needle examination of bilateral lower extremity muscles and bilateral lumbosacral paraspinal muscles were performed.  There was no significant abnormality noted.   Conclusion: This is a mild abnormal study.  There is no evidence of left lumbosacral radiculopathy, no evidence of peripheral neuropathy.  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    ------------------------------- Marcial Pacas, M.D. PhD  [facility] [place], [place] [address] Tel: [phone] Fax: [phone]        [facility]    Nerve / Sites Muscle Latency Ref. Amplitude Ref. Rel Amp Segments Distance Velocity Ref. Area    ms ms mV mV %  cm m/s m/s mVms  L Peroneal - EDB     Ankle EDB 5.8 ?6.5 1.8 ?2.0 100 Ankle - EDB 9   8.3     Fib head EDB 12.3  1.8  97.5 Fib head - Ankle 29 44 ?44 9.5     Pop fossa EDB 15.1  1.3   74.3 Pop fossa - Fib head 12 44 ?44 9.1         Pop fossa - Ankle      R Peroneal - EDB     Ankle EDB 5.0 ?6.5 4.9 ?2.0 100 Ankle - EDB 9   11.4     Fib head EDB 11.4  4.2  85.6 Fib head - Ankle 29 46 ?44 11.0     Pop fossa EDB 13.5  3.9  92.5 Pop fossa - Fib head 10 47 ?44 10.7         Pop fossa - Ankle      L Tibial - AH     Ankle AH 4.9 ?5.8 5.9 ?4.0 100 Ankle - AH 9   21.8     Pop fossa AH 13.8  4.0  68.1 Pop fossa - Ankle 37 42 ?41 16.5  R Tibial - AH     Ankle AH 4.7 ?5.8 6.2 ?4.0 100 Ankle - AH 9   19.5     Pop fossa AH 13.2  6.0  96.6 Pop fossa - Ankle 37 44 ?41 20.2             SNC    Nerve / Sites Rec. Site Peak Lat Ref.  Amp Ref. Segments Distance    ms ms V V  cm  L Sural - Ankle (Calf)     Calf Ankle NR ?4.4 NR ?6 Calf - Ankle 14  R Sural - Ankle (Calf)  Calf Ankle 4.2 ?4.4 13 ?6 Calf - Ankle 14  L Superficial peroneal - Ankle     Lat leg Ankle 4.3 ?4.4 6 ?6 Lat leg - Ankle 14  R Superficial peroneal - Ankle     Lat leg Ankle 4.2 ?4.4 6 ?6 Lat leg - Ankle 14              F  Wave    Nerve F Lat Ref.   ms ms  L Tibial - AH 49.2 ?56.0  R Tibial - AH 49.4 ?56.0         EMG full       EMG Summary Table    Spontaneous MUAP Recruitment  Muscle IA Fib PSW Fasc Other Amp Dur. Poly Pattern  L. Tibialis anterior Normal None None None _______ Normal Normal Normal Normal  L. Tibialis posterior Normal None None None _______ Normal Normal Normal Normal  L. Peroneus longus Normal None None None _______ Normal Normal Normal Normal  L. Vastus lateralis Normal None None None _______ Normal Normal Normal Normal  L. Biceps femoris (short head) Normal None None None _______ Normal Normal Normal Normal  L. Lumbar paraspinals (mid) Normal None None None _______ Normal Normal Normal Normal  L. Lumbar paraspinals (low) Normal None None None _______ Normal Normal Normal Normal  R. Tibialis anterior Normal None None None _______ Normal Normal Normal Normal  R. Tibialis  posterior Normal None None None _______ Normal Normal Normal Normal  R. Peroneus longus Normal None None None _______ Normal Normal Normal Normal  R. Gastrocnemius (Medial head) Normal None None None _______ Normal Normal Normal Normal  R. Vastus lateralis Normal None None None _______ Normal Normal Normal Normal  R. Biceps femoris (long head) Normal None None None _______ Normal Normal Normal Normal  R. Lumbar paraspinals (mid) Normal None None None _______ Normal Normal Normal Normal  R. Lumbar paraspinals (low) Normal None None None _______ Normal Normal Normal Normal

## 2017-10-05 DIAGNOSIS — Z9889 Other specified postprocedural states: Secondary | ICD-10-CM | POA: Diagnosis not present

## 2017-10-05 DIAGNOSIS — M519 Unspecified thoracic, thoracolumbar and lumbosacral intervertebral disc disorder: Secondary | ICD-10-CM | POA: Diagnosis not present

## 2017-10-05 DIAGNOSIS — M5416 Radiculopathy, lumbar region: Secondary | ICD-10-CM | POA: Diagnosis not present

## 2017-10-19 DIAGNOSIS — M519 Unspecified thoracic, thoracolumbar and lumbosacral intervertebral disc disorder: Secondary | ICD-10-CM | POA: Diagnosis not present

## 2017-10-19 DIAGNOSIS — M5416 Radiculopathy, lumbar region: Secondary | ICD-10-CM | POA: Diagnosis not present

## 2017-10-19 DIAGNOSIS — Z9889 Other specified postprocedural states: Secondary | ICD-10-CM | POA: Diagnosis not present

## 2017-10-26 ENCOUNTER — Ambulatory Visit (HOSPITAL_COMMUNITY): Admitting: Psychiatry

## 2017-10-27 ENCOUNTER — Ambulatory Visit (INDEPENDENT_AMBULATORY_CARE_PROVIDER_SITE_OTHER): Payer: Medicare Other | Admitting: Nurse Practitioner

## 2017-10-27 ENCOUNTER — Encounter: Payer: Self-pay | Admitting: Nurse Practitioner

## 2017-10-27 ENCOUNTER — Ambulatory Visit: Payer: Self-pay

## 2017-10-27 VITALS — BP 160/98 | HR 108 | Temp 98.4°F | Ht 65.0 in | Wt 129.0 lb

## 2017-10-27 DIAGNOSIS — J441 Chronic obstructive pulmonary disease with (acute) exacerbation: Secondary | ICD-10-CM

## 2017-10-27 DIAGNOSIS — I10 Essential (primary) hypertension: Secondary | ICD-10-CM | POA: Diagnosis not present

## 2017-10-27 MED ORDER — DOXYCYCLINE HYCLATE 100 MG PO TABS: 100.0000 mg | ORAL_TABLET | Freq: Two times a day (BID) | ORAL | 0 refills | Status: DC

## 2017-10-27 MED ORDER — BENZONATATE 100 MG PO CAPS: 100.0000 mg | ORAL_CAPSULE | Freq: Two times a day (BID) | ORAL | 0 refills | Status: DC | PRN
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
## 2017-10-27 NOTE — Telephone Encounter (Signed)
Patient called in with c/o "cough, chest tightness." She says "it came on Monday morning, starting as a sore throat and not as bad a cough. As the day went on and last night, it was bad. I'm coughing up yellow sputum, no blood. I am taking shorter breaths trying not to take a deep breath, because I start coughing. The last time I got like this, I ended up at the hospital with bronchitis. I know you are going to tell me to come in to the office. I'll have to find me a ride to come." I asked about other symptoms, she says "wheezing and chest tightness with coughing." According to protocol, see PCP within 24 hours, appointment scheduled for today at 1430 with Caesar Chestnut, NP, care advice given, patient verbalized understanding.  Reason for Disposition . [1] Continuous (nonstop) coughing interferes with work or school AND [2] no improvement using cough treatment per Care Advice  Answer Assessment - Initial Assessment Questions 1. ONSET: "When did the cough begin?"      Monday morning 2. SEVERITY: "How bad is the cough today?"      Bad, congested 3. RESPIRATORY DISTRESS: "Describe your breathing."      Can't take a deep breath without coughing, taking short breaths 4. FEVER: "Do you have a fever?" If so, ask: "What is your temperature, how was it measured, and when did it start?"     I don't know, but Monday I was freezing 5. SPUTUM: "Describe the color of your sputum" (clear, white, yellow, green)     Yellow 6. HEMOPTYSIS: "Are you coughing up any blood?" If so ask: "How much?" (flecks, streaks, tablespoons, etc.)     No 7. CARDIAC HISTORY: "Do you have any history of heart disease?" (e.g., heart attack, congestive heart failure)      No 8. LUNG HISTORY: "Do you have any history of lung disease?"  (e.g., pulmonary embolus, asthma, emphysema)     No 9. PE RISK FACTORS: "Do you have a history of blood clots?" (or: recent major surgery, recent prolonged travel, bedridden)     No 10. OTHER  SYMPTOMS: "Do you have any other symptoms?" (e.g., runny nose, wheezing, chest pain)       Wheezing, chest tightness 11. PREGNANCY: "Is there any chance you are pregnant?" "When was your last menstrual period?"       No 12. TRAVEL: "Have you traveled out of the country in the last month?" (e.g., travel history, exposures)       No  Protocols used: [place]

## 2017-10-27 NOTE — Patient Instructions (Addendum)
Start breo 1 puff daily for the next week. Start doxycycline 100 mg twice daily for 10 days I also sent tessalon for your cough  Please try to check your blood pressure once daily or at least a few times a week, at the same time each day, and keep a log. Please notify cardiology if your blood pressure does not improve to <140/90 after you start feeling better.   Chronic Obstructive Pulmonary Disease Exacerbation Chronic obstructive pulmonary disease (COPD) is a common lung problem. In COPD, the flow of air from the lungs is limited. COPD exacerbations are times that breathing gets worse and you need extra treatment. Without treatment they can be life threatening. If they happen often, your lungs can become more damaged. If your COPD gets worse, your doctor may treat you with:  Medicines.  Oxygen.  Different ways to clear your airway, such as using a mask.  Follow these instructions at home:  Do not smoke.  Avoid tobacco smoke and other things that bother your lungs.  If given, take your antibiotic medicine as told. Finish the medicine even if you start to feel better.  Only take medicines as told by your doctor.  Drink enough fluids to keep your pee (urine) clear or pale yellow (unless your doctor has told you not to).  Use a cool mist machine (vaporizer).  If you use oxygen or a machine that turns liquid medicine into a mist (nebulizer), continue to use them as told.  Keep up with shots (vaccinations) as told by your doctor.  Exercise regularly.  Eat healthy foods.  Keep all doctor visits as told. Get help right away if:  You are very short of breath and it gets worse.  You have trouble talking.  You have bad chest pain.  You have blood in your spit (sputum).  You have a fever.  You keep throwing up (vomiting).  You feel weak, or you pass out (faint).  You feel confused.  You keep getting worse. This information is not intended to replace advice given to  you by your health care provider. Make sure you discuss any questions you have with your health care provider. Document Released: 01/22/2011 Document Revised: 07/11/2015 Document Reviewed: 10/07/2012 Elsevier Interactive Patient Education  2017 [employer].

## 2017-10-27 NOTE — Telephone Encounter (Signed)
"  I need to schedule my surgery.  I been calling and no one has returned my call.  If Dr. Milinda Pointer can't do it, I can see somebody else.  My foot is killing me."  I tried to return your call.  "Did you leave me a message because I haven't gotten and messages?"  Yes, I left you a message.  Dr. Milinda Pointer can do your surgery on December 03, 2017.  "That date is fine.  What else do I need to do?"  You don't need to do anything.  Someone from the surgical center will call you a day or two prior to your surgery date.  They will give you your arrival time.

## 2017-10-27 NOTE — Assessment & Plan Note (Signed)
BP is quite elevated today but she is here for an acute visit, not feeling well which is likely contributing to her increased BP reading Noted to have normal readings on recent clinical recordings and cardiology is now following her BP  I have asked her to monitor BP readings at home, F/U for continued readings >140/90 once acute illness has improved

## 2017-10-27 NOTE — Assessment & Plan Note (Signed)
Will start breo course x 1 week, sample provided, antibiotic course, tessalon prn-dosing and side effects discussed Instructed to quit smoking Home management, Red flags and when to present for emergency care or RTC including fever >101.40F, chest pain, shortness of breath, new/worsening/un-resolving symptoms, reviewed with patient at time of visit. Follow up and care instructions discussed and provided in AVS. - doxycycline (VIBRA-TABS) 100 MG tablet; Take 1 tablet (100 mg total) by mouth 2 (two) times daily.  Dispense: 20 tablet; Refill: 0 - benzonatate (TESSALON) 100 MG capsule; Take 1 capsule (100 mg total) by mouth 2 (two) times daily as needed for cough.  Dispense: 20 capsule; Refill: 0

## 2017-11-10 ENCOUNTER — Encounter: Payer: Self-pay | Admitting: Nurse Practitioner

## 2017-11-10 ENCOUNTER — Ambulatory Visit (INDEPENDENT_AMBULATORY_CARE_PROVIDER_SITE_OTHER): Payer: Medicare Other | Admitting: Nurse Practitioner

## 2017-11-10 VITALS — BP 142/98 | HR 96 | Temp 98.4°F | Ht 65.0 in | Wt 131.0 lb

## 2017-11-10 DIAGNOSIS — I10 Essential (primary) hypertension: Secondary | ICD-10-CM | POA: Diagnosis not present

## 2017-11-10 DIAGNOSIS — F17200 Nicotine dependence, unspecified, uncomplicated: Secondary | ICD-10-CM

## 2017-11-10 DIAGNOSIS — F419 Anxiety disorder, unspecified: Secondary | ICD-10-CM | POA: Diagnosis not present

## 2017-11-10 DIAGNOSIS — E119 Type 2 diabetes mellitus without complications: Secondary | ICD-10-CM | POA: Diagnosis not present

## 2017-11-10 DIAGNOSIS — Z23 Encounter for immunization: Secondary | ICD-10-CM

## 2017-11-10 DIAGNOSIS — F329 Major depressive disorder, single episode, unspecified: Secondary | ICD-10-CM | POA: Diagnosis not present

## 2017-11-10 DIAGNOSIS — F332 Major depressive disorder, recurrent severe without psychotic features: Secondary | ICD-10-CM | POA: Diagnosis not present

## 2017-11-10 DIAGNOSIS — J449 Chronic obstructive pulmonary disease, unspecified: Secondary | ICD-10-CM | POA: Diagnosis not present

## 2017-11-10 DIAGNOSIS — R059 Cough, unspecified: Secondary | ICD-10-CM

## 2017-11-10 DIAGNOSIS — G4700 Insomnia, unspecified: Secondary | ICD-10-CM

## 2017-11-10 DIAGNOSIS — R05 Cough: Secondary | ICD-10-CM | POA: Diagnosis not present

## 2017-11-10 DIAGNOSIS — F32A Depression, unspecified: Secondary | ICD-10-CM

## 2017-11-10 LAB — POCT GLYCOSYLATED HEMOGLOBIN (HGB A1C): Hemoglobin A1C: 6 % — AB (ref 4.0–5.6)

## 2017-11-10 MED ORDER — ZOLPIDEM TARTRATE 10 MG PO TABS: 10.0000 mg | ORAL_TABLET | Freq: Every evening | ORAL | 0 refills | Status: DC | PRN

## 2017-11-10 MED ORDER — LORAZEPAM 0.5 MG PO TABS: 0.5000 mg | ORAL_TABLET | Freq: Every day | ORAL | 0 refills | Status: DC | PRN

## 2017-11-10 MED ORDER — METFORMIN HCL 500 MG PO TABS: 500.0000 mg | ORAL_TABLET | Freq: Two times a day (BID) | ORAL | 3 refills | Status: DC

## 2017-11-10 MED ORDER — TRIAMTERENE-HCTZ 50-25 MG PO CAPS: 1.0000 | ORAL_CAPSULE | ORAL | 1 refills | Status: DC

## 2017-11-10 NOTE — Patient Instructions (Signed)
Please increase your triamterene- HCTZ dosage to 50-25 daily, a new prescription has been sent  Please reduce your metformin to 500 mg twice daily  Referrals ordered: psychiatry  Please return in about 2 weeks for follow up, so I can recheck your blood pressure and see how youre doing   DASH Eating Plan DASH stands for "Dietary Approaches to Stop Hypertension." The DASH eating plan is a healthy eating plan that has been shown to reduce high blood pressure (hypertension). It may also reduce your risk for type 2 diabetes, heart disease, and stroke. The DASH eating plan may also help with weight loss. What are tips for following this plan? General guidelines  Avoid eating more than 2,300 mg (milligrams) of salt (sodium) a day. If you have hypertension, you may need to reduce your sodium intake to 1,500 mg a day.  Limit alcohol intake to no more than 1 drink a day for nonpregnant women and 2 drinks a day for men. One drink equals 12 oz of beer, 5 oz of wine, or 1 oz of hard liquor.  Work with your health care provider to maintain a healthy body weight or to lose weight. Ask what an ideal weight is for you.  Get at least 30 minutes of exercise that causes your heart to beat faster (aerobic exercise) most days of the week. Activities may include walking, swimming, or biking.  Work with your health care provider or diet and nutrition specialist (dietitian) to adjust your eating plan to your individual calorie needs. Reading food labels  Check food labels for the amount of sodium per serving. Choose foods with less than 5 percent of the Daily Value of sodium. Generally, foods with less than 300 mg of sodium per serving fit into this eating plan.  To find whole grains, look for the word "whole" as the first word in the ingredient list. Shopping  Buy products labeled as "low-sodium" or "no salt added."  Buy fresh foods. Avoid canned foods and premade or frozen meals. Cooking  Avoid adding  salt when cooking. Use salt-free seasonings or herbs instead of table salt or sea salt. Check with your health care provider or pharmacist before using salt substitutes.  Do not fry foods. Cook foods using healthy methods such as baking, boiling, grilling, and broiling instead.  Cook with heart-healthy oils, such as olive, canola, soybean, or sunflower oil. Meal planning   Eat a balanced diet that includes: ? 5 or more servings of fruits and vegetables each day. At each meal, try to fill half of your plate with fruits and vegetables. ? Up to 6-8 servings of whole grains each day. ? Less than 6 oz of lean meat, poultry, or fish each day. A 3-oz serving of meat is about the same size as a deck of cards. One egg equals 1 oz. ? 2 servings of low-fat dairy each day. ? A serving of nuts, seeds, or beans 5 times each week. ? Heart-healthy fats. Healthy fats called Omega-3 fatty acids are found in foods such as flaxseeds and coldwater fish, like sardines, salmon, and mackerel.  Limit how much you eat of the following: ? Canned or prepackaged foods. ? Food that is high in trans fat, such as fried foods. ? Food that is high in saturated fat, such as fatty meat. ? Sweets, desserts, sugary drinks, and other foods with added sugar. ? Full-fat dairy products.  Do not salt foods before eating.  Try to eat at least 2 vegetarian meals  each week.  Eat more home-cooked food and less restaurant, buffet, and fast food.  When eating at a restaurant, ask that your food be prepared with less salt or no salt, if possible. What foods are recommended? The items listed may not be a complete list. Talk with your dietitian about what dietary choices are best for you. Grains Whole-grain or whole-wheat bread. Whole-grain or whole-wheat pasta. Brown rice. [place]. Bulgur. Whole-grain and low-sodium cereals. Pita bread. Low-fat, low-sodium crackers. Whole-wheat flour tortillas. Vegetables Fresh or frozen  vegetables (raw, steamed, roasted, or grilled). Low-sodium or reduced-sodium tomato and vegetable juice. Low-sodium or reduced-sodium tomato sauce and tomato paste. Low-sodium or reduced-sodium canned vegetables. Fruits All fresh, dried, or frozen fruit. Canned fruit in natural juice (without added sugar). Meat and other protein foods Skinless chicken or [place]. Ground chicken or [place]. Pork with fat trimmed off. Fish and seafood. Egg whites. Dried beans, peas, or lentils. Unsalted nuts, nut butters, and seeds. Unsalted canned beans. Lean cuts of beef with fat trimmed off. Low-sodium, lean deli meat. Dairy Low-fat (1%) or fat-free (skim) milk. Fat-free, low-fat, or reduced-fat cheeses. Nonfat, low-sodium ricotta or cottage cheese. Low-fat or nonfat yogurt. Low-fat, low-sodium cheese. Fats and oils Soft margarine without trans fats. Vegetable oil. Low-fat, reduced-fat, or light mayonnaise and salad dressings (reduced-sodium). Canola, safflower, olive, soybean, and sunflower oils. Avocado. Seasoning and other foods Herbs. Spices. Seasoning mixes without salt. Unsalted popcorn and pretzels. Fat-free sweets. What foods are not recommended? The items listed may not be a complete list. Talk with your dietitian about what dietary choices are best for you. Grains Baked goods made with fat, such as croissants, muffins, or some breads. Dry pasta or rice meal packs. Vegetables Creamed or fried vegetables. Vegetables in a cheese sauce. Regular canned vegetables (not low-sodium or reduced-sodium). Regular canned tomato sauce and paste (not low-sodium or reduced-sodium). Regular tomato and vegetable juice (not low-sodium or reduced-sodium). Angie Fava. Olives. Fruits Canned fruit in a light or heavy syrup. Fried fruit. Fruit in cream or butter sauce. Meat and other protein foods Fatty cuts of meat. Ribs. Fried meat. Berniece Salines. Sausage. Bologna and other processed lunch meats. Salami. Fatback. Hotdogs. Bratwurst.  Salted nuts and seeds. Canned beans with added salt. Canned or smoked fish. Whole eggs or egg yolks. Chicken or [place] with skin. Dairy Whole or 2% milk, cream, and half-and-half. Whole or full-fat cream cheese. Whole-fat or sweetened yogurt. Full-fat cheese. Nondairy creamers. Whipped toppings. Processed cheese and cheese spreads. Fats and oils Butter. Stick margarine. Lard. Shortening. Ghee. Bacon fat. Tropical oils, such as coconut, palm kernel, or palm oil. Seasoning and other foods Salted popcorn and pretzels. Onion salt, garlic salt, seasoned salt, table salt, and sea salt. Worcestershire sauce. Tartar sauce. Barbecue sauce. Teriyaki sauce. Soy sauce, including reduced-sodium. Steak sauce. Canned and packaged gravies. Fish sauce. Oyster sauce. Cocktail sauce. Horseradish that you find on the shelf. Ketchup. Mustard. Meat flavorings and tenderizers. Bouillon cubes. Hot sauce and Tabasco sauce. Premade or packaged marinades. Premade or packaged taco seasonings. Relishes. Regular salad dressings. Where to find more information:  National Heart, Lung, and [place]: https://wilson-eaton.com/  American Heart Association: www.heart.org Summary  The DASH eating plan is a healthy eating plan that has been shown to reduce high blood pressure (hypertension). It may also reduce your risk for type 2 diabetes, heart disease, and stroke.  With the DASH eating plan, you should limit salt (sodium) intake to 2,300 mg a day. If you have hypertension, you may need to reduce  your sodium intake to 1,500 mg a day.  When on the DASH eating plan, aim to eat more fresh fruits and vegetables, whole grains, lean proteins, low-fat dairy, and heart-healthy fats.  Work with your health care provider or diet and nutrition specialist (dietitian) to adjust your eating plan to your individual calorie needs. This information is not intended to replace advice given to you by your health care provider. Make sure you discuss any  questions you have with your health care provider. Document Released: 01/22/2011 Document Revised: 01/27/2016 Document Reviewed: 01/27/2016 Elsevier Interactive Patient Education  [employer].

## 2017-11-10 NOTE — Assessment & Plan Note (Signed)
Per 6/27 pulm notes, no need for daily maintenance inhaler at this time, focus on quitting smoking to improve lungs, she has signed up for a smoking cessation class F/u for new, worsening, persistent cough We could consider starting breo daily vs referral back to pulmonology if cough continues
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
## 2017-11-10 NOTE — Assessment & Plan Note (Signed)
Due to question of hypoglycemia and lower A1c, will decrease metformin dosage to 500 BID F/U for new, worsening, persistent symptoms Repeat A1c in 3 months - POCT glycosylated hemoglobin (Hb A1C)- Lab Results  Component Value Date   HGBA1C 6.0 (A) 11/10/2017  - metFORMIN (GLUCOPHAGE) 500 MG tablet; Take 1 tablet (500 mg total) by mouth 2 (two) times daily with a meal.  Dispense: 270 tablet; Refill: 3

## 2017-11-10 NOTE — Assessment & Plan Note (Signed)
Encouraged to continue with smoking cessation

## 2017-11-10 NOTE — Assessment & Plan Note (Signed)
BP remains elevated Continue losartan, amlodipine, increase triamterene-HCTZ dosage- she is agreeable to this- dosing and side effects discussed RTC in 2 weeks for F/U- repeat BP reading, BMET Additional education provided on AVS - triamterene-hydrochlorothiazide (DYAZIDE) 50-25 MG capsule; Take 1 capsule by mouth every morning.  Dispense: 30 capsule; Refill: 1
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
## 2017-11-11 ENCOUNTER — Ambulatory Visit: Payer: Medicare Other | Admitting: Cardiology

## 2017-11-11 DIAGNOSIS — R0989 Other specified symptoms and signs involving the circulatory and respiratory systems: Secondary | ICD-10-CM
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
## 2017-11-12 ENCOUNTER — Ambulatory Visit: Admitting: Pulmonary Disease

## 2017-11-16 ENCOUNTER — Telehealth: Payer: Self-pay | Admitting: Nurse Practitioner

## 2017-11-16 ENCOUNTER — Telehealth: Payer: Self-pay | Admitting: Podiatry

## 2017-11-16 NOTE — Telephone Encounter (Signed)
Called pt and left a voicemail that she needs to call us back and schedule an appointment before 18 October to go over and sign consent forms. I informed the pt in the voicemail that the signed consent forms expire after 6 months and her consent form was signed  27 February. I asked the pt to call us back at [phone] and to choose the appointment line to schedule an appointment.

## 2017-11-16 NOTE — Telephone Encounter (Signed)
The medication is her [facility]

## 2017-11-16 NOTE — Telephone Encounter (Signed)
Copied from [place] [id number]. Topic: General - Other >> Nov 16, 2017 12:18 PM Cecelia Byars, NT wrote: Reason for CRM: The pharmacy called to make sure a request for a pa was received that has been faxed , the patient has become hostile to the pharmacy staff during process , please advise ,they are  re faxing , [facility] [address] ([address]), [place] [phone] (Phone) [phone] (Fax)

## 2017-11-17 NOTE — Telephone Encounter (Signed)
I called [employer] and was advised this has to be completed through online provider portal. Form printed from [employer] and given to provider for completion/signature.

## 2017-11-17 NOTE — Telephone Encounter (Signed)
I called pt and tried to find out if she has taken anything else for insomnia. She states she has not taken any other prescriptions for this. I advised her she can either pay for zolpidem OOP or contact Medicaid to see what they cover. She was very upset because she has been out of Zolpidem and has not slept all week and because (I or WE) do not know what is covered.  She states she will contact Medicaid and call us back with what is covered.

## 2017-11-18 NOTE — Telephone Encounter (Signed)
I called [facility]- they are aware that the patient is supposed to be verifying what other alternatives are covered. Once we find out what else is covered, we will send in a new script for patient.

## 2017-11-22 ENCOUNTER — Ambulatory Visit (HOSPITAL_COMMUNITY): Payer: Medicare Other | Admitting: Licensed Clinical Social Worker

## 2017-11-22 ENCOUNTER — Telehealth: Payer: Self-pay | Admitting: *Deleted

## 2017-11-22 NOTE — Telephone Encounter (Signed)
"  I have surgery scheduled with Dr. Milinda Pointer on October 18.  I got a voicemail to call and register.  I can't find my voicemail.  I think I erased it and I can't remember the website I need to go on.  If someone could please call me back and let me know what website I need to go on to register for the surgery or update.  I guess that's what they said.  If someone could please give me a call and let me know the name of the website."

## 2017-11-24 ENCOUNTER — Encounter: Payer: Self-pay | Admitting: Nurse Practitioner

## 2017-11-24 ENCOUNTER — Ambulatory Visit (INDEPENDENT_AMBULATORY_CARE_PROVIDER_SITE_OTHER): Payer: Medicare Other | Admitting: Nurse Practitioner

## 2017-11-24 DIAGNOSIS — G629 Polyneuropathy, unspecified: Secondary | ICD-10-CM

## 2017-11-24 DIAGNOSIS — I10 Essential (primary) hypertension: Secondary | ICD-10-CM | POA: Diagnosis not present

## 2017-11-24 DIAGNOSIS — E119 Type 2 diabetes mellitus without complications: Secondary | ICD-10-CM | POA: Diagnosis not present

## 2017-11-24 MED ORDER — GABAPENTIN 300 MG PO CAPS: 300.0000 mg | ORAL_CAPSULE | Freq: Every day | ORAL | 2 refills | Status: DC

## 2017-11-24 MED ORDER — LOSARTAN POTASSIUM 100 MG PO TABS: 100.0000 mg | ORAL_TABLET | Freq: Every day | ORAL | 2 refills | Status: DC

## 2017-11-24 MED ORDER — AMLODIPINE BESYLATE 10 MG PO TABS: 10.0000 mg | ORAL_TABLET | Freq: Every day | ORAL | 2 refills | Status: DC
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
## 2017-11-24 NOTE — Patient Instructions (Signed)
For your blood pressure:  Continue losartan and amlodipine Pick up and start the Triamterene-HCTZ 50-25 Goal is less than 140/90   DASH Eating Plan DASH stands for "Dietary Approaches to Stop Hypertension." The DASH eating plan is a healthy eating plan that has been shown to reduce high blood pressure (hypertension). It may also reduce your risk for type 2 diabetes, heart disease, and stroke. The DASH eating plan may also help with weight loss. What are tips for following this plan? General guidelines  Avoid eating more than 2,300 mg (milligrams) of salt (sodium) a day. If you have hypertension, you may need to reduce your sodium intake to 1,500 mg a day.  Limit alcohol intake to no more than 1 drink a day for nonpregnant women and 2 drinks a day for men. One drink equals 12 oz of beer, 5 oz of wine, or 1 oz of hard liquor.  Work with your health care provider to maintain a healthy body weight or to lose weight. Ask what an ideal weight is for you.  Get at least 30 minutes of exercise that causes your heart to beat faster (aerobic exercise) most days of the week. Activities may include walking, swimming, or biking.  Work with your health care provider or diet and nutrition specialist (dietitian) to adjust your eating plan to your individual calorie needs. Reading food labels  Check food labels for the amount of sodium per serving. Choose foods with less than 5 percent of the Daily Value of sodium. Generally, foods with less than 300 mg of sodium per serving fit into this eating plan.  To find whole grains, look for the word "whole" as the first word in the ingredient list. Shopping  Buy products labeled as "low-sodium" or "no salt added."  Buy fresh foods. Avoid canned foods and premade or frozen meals. Cooking  Avoid adding salt when cooking. Use salt-free seasonings or herbs instead of table salt or sea salt. Check with your health care provider or pharmacist before using salt  substitutes.  Do not fry foods. Cook foods using healthy methods such as baking, boiling, grilling, and broiling instead.  Cook with heart-healthy oils, such as olive, canola, soybean, or sunflower oil. Meal planning   Eat a balanced diet that includes: ? 5 or more servings of fruits and vegetables each day. At each meal, try to fill half of your plate with fruits and vegetables. ? Up to 6-8 servings of whole grains each day. ? Less than 6 oz of lean meat, poultry, or fish each day. A 3-oz serving of meat is about the same size as a deck of cards. One egg equals 1 oz. ? 2 servings of low-fat dairy each day. ? A serving of nuts, seeds, or beans 5 times each week. ? Heart-healthy fats. Healthy fats called Omega-3 fatty acids are found in foods such as flaxseeds and coldwater fish, like sardines, salmon, and mackerel.  Limit how much you eat of the following: ? Canned or prepackaged foods. ? Food that is high in trans fat, such as fried foods. ? Food that is high in saturated fat, such as fatty meat. ? Sweets, desserts, sugary drinks, and other foods with added sugar. ? Full-fat dairy products.  Do not salt foods before eating.  Try to eat at least 2 vegetarian meals each week.  Eat more home-cooked food and less restaurant, buffet, and fast food.  When eating at a restaurant, ask that your food be prepared with less salt or  no salt, if possible. °What foods are recommended? °The items listed may not be a complete list. Talk with your dietitian about what dietary choices are best for you. °Grains °Whole-grain or whole-wheat bread. Whole-grain or whole-wheat pasta. Brown rice. Oatmeal. Quinoa. Bulgur. Whole-grain and low-sodium cereals. Pita bread. Low-fat, low-sodium crackers. Whole-wheat flour tortillas. °Vegetables °Fresh or frozen vegetables (raw, steamed, roasted, or grilled). Low-sodium or reduced-sodium tomato and vegetable juice. Low-sodium or reduced-sodium tomato sauce and tomato  paste. Low-sodium or reduced-sodium canned vegetables. °Fruits °All fresh, dried, or frozen fruit. Canned fruit in natural juice (without added sugar). °Meat and other protein foods °Skinless chicken or turkey. Ground chicken or turkey. Pork with fat trimmed off. Fish and seafood. Egg whites. Dried beans, peas, or lentils. Unsalted nuts, nut butters, and seeds. Unsalted canned beans. Lean cuts of beef with fat trimmed off. Low-sodium, lean deli meat. °Dairy °Low-fat (1%) or fat-free (skim) milk. Fat-free, low-fat, or reduced-fat cheeses. Nonfat, low-sodium ricotta or cottage cheese. Low-fat or nonfat yogurt. Low-fat, low-sodium cheese. °Fats and oils °Soft margarine without trans fats. Vegetable oil. Low-fat, reduced-fat, or light mayonnaise and salad dressings (reduced-sodium). Canola, safflower, olive, soybean, and sunflower oils. Avocado. °Seasoning and other foods °Herbs. Spices. Seasoning mixes without salt. Unsalted popcorn and pretzels. Fat-free sweets. °What foods are not recommended? °The items listed may not be a complete list. Talk with your dietitian about what dietary choices are best for you. °Grains °Baked goods made with fat, such as croissants, muffins, or some breads. Dry pasta or rice meal packs. °Vegetables °Creamed or fried vegetables. Vegetables in a cheese sauce. Regular canned vegetables (not low-sodium or reduced-sodium). Regular canned tomato sauce and paste (not low-sodium or reduced-sodium). Regular tomato and vegetable juice (not low-sodium or reduced-sodium). Pickles. Olives. °Fruits °Canned fruit in a light or heavy syrup. Fried fruit. Fruit in cream or butter sauce. °Meat and other protein foods °Fatty cuts of meat. Ribs. Fried meat. Bacon. Sausage. Bologna and other processed lunch meats. Salami. Fatback. Hotdogs. Bratwurst. Salted nuts and seeds. Canned beans with added salt. Canned or smoked fish. Whole eggs or egg yolks. Chicken or turkey with skin. °Dairy °Whole or 2% milk,  cream, and half-and-half. Whole or full-fat cream cheese. Whole-fat or sweetened yogurt. Full-fat cheese. Nondairy creamers. Whipped toppings. Processed cheese and cheese spreads. °Fats and oils °Butter. Stick margarine. Lard. Shortening. Ghee. Bacon fat. Tropical oils, such as coconut, palm kernel, or palm oil. °Seasoning and other foods °Salted popcorn and pretzels. Onion salt, garlic salt, seasoned salt, table salt, and sea salt. Worcestershire sauce. Tartar sauce. Barbecue sauce. Teriyaki sauce. Soy sauce, including reduced-sodium. Steak sauce. Canned and packaged gravies. Fish sauce. Oyster sauce. Cocktail sauce. Horseradish that you find on the shelf. Ketchup. Mustard. Meat flavorings and tenderizers. Bouillon cubes. Hot sauce and Tabasco sauce. Premade or packaged marinades. Premade or packaged taco seasonings. Relishes. Regular salad dressings. °Where to find more information: °· National Heart, Lung, and Blood Institute: www.nhlbi.nih.gov °· American Heart Association: www.heart.org °Summary °· The DASH eating plan is a healthy eating plan that has been shown to reduce high blood pressure (hypertension). It may also reduce your risk for type 2 diabetes, heart disease, and stroke. °· With the DASH eating plan, you should limit salt (sodium) intake to 2,300 mg a day. If you have hypertension, you may need to reduce your sodium intake to 1,500 mg a day. °· When on the DASH eating plan, aim to eat more fresh fruits and vegetables, whole grains, lean proteins, low-fat dairy, and   heart-healthy fats.  Work with your health care provider or diet and nutrition specialist (dietitian) to adjust your eating plan to your individual calorie needs. This information is not intended to replace advice given to you by your health care provider. Make sure you discuss any questions you have with your health care provider. Document Released: 01/22/2011 Document Revised: 01/27/2016 Document Reviewed: 01/27/2016 Elsevier  Interactive Patient Education  [employer].

## 2017-11-24 NOTE — Assessment & Plan Note (Signed)
Stable Continue current meds Next A1c due 01/2018 - gabapentin (NEURONTIN) 300 MG capsule; Take 1 capsule (300 mg total) by mouth daily.  Dispense: 90 capsule; Refill: 2

## 2017-11-24 NOTE — Assessment & Plan Note (Addendum)
BP remains elevated Instructed to continue amlodipine, losartan and pick up new Triam-HCTZ rx, she will notify office if any trouble getting this medication from her pharmacy RTC in 2 weeks for F/U, BMET Discussed and printed DASH diet, BP goals - amLODipine (NORVASC) 10 MG tablet; Take 1 tablet (10 mg total) by mouth daily.  Dispense: 90 tablet; Refill: 2 - losartan (COZAAR) 100 MG tablet; Take 1 tablet (100 mg total) by mouth daily.  Dispense: 90 tablet; Refill: 2

## 2017-11-24 NOTE — Assessment & Plan Note (Signed)
Stable Continue current meds F/u for new, worsening symptoms - gabapentin (NEURONTIN) 300 MG capsule; Take 1 capsule (300 mg total) by mouth daily.  Dispense: 90 capsule; Refill: 2

## 2017-11-25 ENCOUNTER — Ambulatory Visit (INDEPENDENT_AMBULATORY_CARE_PROVIDER_SITE_OTHER): Payer: Medicare Other | Admitting: Podiatry

## 2017-11-25 DIAGNOSIS — M2012 Hallux valgus (acquired), left foot: Secondary | ICD-10-CM

## 2017-11-25 NOTE — Progress Notes (Signed)
She presents today for re-signing of her consent form for surgery next week.  We went over the consent form once again today discussed her past medical history medications allergies which is all unchanged.  Discussed postop pain medication etc. she understands this is amenable to it signed all 3 pages a consent form I will follow-up with her in the near future for surgery.

## 2017-11-25 NOTE — Telephone Encounter (Signed)
I attempted to return her call.  The number was temporarily out of service.

## 2017-11-26 NOTE — Telephone Encounter (Signed)
I attempted to call the patient again.  Her phone was not in service.

## 2017-11-30 ENCOUNTER — Other Ambulatory Visit: Payer: Self-pay | Admitting: Podiatry

## 2017-11-30 MED ORDER — CEPHALEXIN 500 MG PO CAPS: 500.0000 mg | ORAL_CAPSULE | Freq: Three times a day (TID) | ORAL | 0 refills | Status: DC

## 2017-11-30 MED ORDER — HYDROMORPHONE HCL 4 MG PO TABS: 4.0000 mg | ORAL_TABLET | Freq: Four times a day (QID) | ORAL | 0 refills | Status: AC | PRN

## 2017-11-30 MED ORDER — ONDANSETRON HCL 4 MG PO TABS: 4.0000 mg | ORAL_TABLET | Freq: Three times a day (TID) | ORAL | 0 refills | Status: DC | PRN

## 2017-12-03 ENCOUNTER — Encounter: Payer: Self-pay | Admitting: Podiatry

## 2017-12-03 DIAGNOSIS — M25572 Pain in left ankle and joints of left foot: Secondary | ICD-10-CM | POA: Diagnosis not present

## 2017-12-03 DIAGNOSIS — M205X2 Other deformities of toe(s) (acquired), left foot: Secondary | ICD-10-CM | POA: Diagnosis not present

## 2017-12-03 DIAGNOSIS — E7800 Pure hypercholesterolemia, unspecified: Secondary | ICD-10-CM | POA: Diagnosis not present

## 2017-12-03 DIAGNOSIS — M2012 Hallux valgus (acquired), left foot: Secondary | ICD-10-CM | POA: Diagnosis not present

## 2017-12-06 ENCOUNTER — Telehealth: Payer: Self-pay | Admitting: Nurse Practitioner

## 2017-12-06 NOTE — Telephone Encounter (Signed)
I had recently referred her to [facility] to establish psychiatric care, they are unable to see her. Please let her know we are sending her a list of local psychiatry providers in the mail, she can call on her own to schedule an appointment with one of these providers, does not need a referral to me

## 2017-12-06 NOTE — Telephone Encounter (Signed)
Pt informed of below. Copy of psychiatrists phone numbers mailed to patient.

## 2017-12-09 ENCOUNTER — Encounter: Payer: Self-pay | Admitting: Podiatry

## 2017-12-09 ENCOUNTER — Ambulatory Visit (INDEPENDENT_AMBULATORY_CARE_PROVIDER_SITE_OTHER): Payer: Medicare Other | Admitting: Podiatry

## 2017-12-09 ENCOUNTER — Ambulatory Visit (INDEPENDENT_AMBULATORY_CARE_PROVIDER_SITE_OTHER): Payer: Medicare Other

## 2017-12-09 VITALS — BP 162/102 | HR 74 | Temp 96.7°F

## 2017-12-09 DIAGNOSIS — M2012 Hallux valgus (acquired), left foot: Secondary | ICD-10-CM

## 2017-12-09 MED ORDER — OXYCODONE-ACETAMINOPHEN 10-325 MG PO TABS: 1.0000 | ORAL_TABLET | ORAL | 0 refills | Status: DC | PRN

## 2017-12-16 ENCOUNTER — Ambulatory Visit (INDEPENDENT_AMBULATORY_CARE_PROVIDER_SITE_OTHER): Payer: Medicare Other | Admitting: Podiatry

## 2017-12-16 DIAGNOSIS — M2012 Hallux valgus (acquired), left foot: Secondary | ICD-10-CM
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
## 2017-12-16 NOTE — Progress Notes (Signed)
She presents today for postop visit date of surgery 12/03/2017 status post Liane Comber bunionectomy and Akin osteotomy left foot.  States the left foot feels better than it did last week but I did get it wet.  She denies fever chills nausea vomiting muscle aches and pains.  Objective: Vital signs are stable she is alert and oriented times 3 sutures were removed today margins remain well coapted she has good range of motion of the first metatarsophalangeal joint there is stiff.  There is minimal edema no erythema cellulitis drainage or odor.  Assessment: Well-healing surgical foot.  Plan: Place her in a compression anklet to be worn during the day only and a [place]co shoe.  I will follow-up with her in about 2 to 3 weeks for another set of x-rays.

## 2017-12-17 ENCOUNTER — Ambulatory Visit: Payer: Medicare Other | Admitting: Nurse Practitioner

## 2017-12-29 ENCOUNTER — Encounter: Payer: Self-pay | Admitting: Nurse Practitioner

## 2017-12-29 ENCOUNTER — Other Ambulatory Visit (INDEPENDENT_AMBULATORY_CARE_PROVIDER_SITE_OTHER): Payer: Medicare Other

## 2017-12-29 ENCOUNTER — Ambulatory Visit (INDEPENDENT_AMBULATORY_CARE_PROVIDER_SITE_OTHER): Payer: Medicare Other | Admitting: Nurse Practitioner

## 2017-12-29 VITALS — BP 134/90 | HR 98 | Ht 65.0 in | Wt 121.0 lb

## 2017-12-29 DIAGNOSIS — L299 Pruritus, unspecified: Secondary | ICD-10-CM | POA: Diagnosis not present

## 2017-12-29 DIAGNOSIS — E559 Vitamin D deficiency, unspecified: Secondary | ICD-10-CM

## 2017-12-29 DIAGNOSIS — R7989 Other specified abnormal findings of blood chemistry: Secondary | ICD-10-CM | POA: Diagnosis not present

## 2017-12-29 DIAGNOSIS — I10 Essential (primary) hypertension: Secondary | ICD-10-CM

## 2017-12-29 DIAGNOSIS — R202 Paresthesia of skin: Secondary | ICD-10-CM | POA: Diagnosis not present

## 2017-12-29 LAB — COMPREHENSIVE METABOLIC PANEL
ALT: 57 U/L — ABNORMAL HIGH (ref 0–35)
AST: 53 U/L — AB (ref 0–37)
Albumin: 4.9 g/dL (ref 3.5–5.2)
Alkaline Phosphatase: 103 U/L (ref 39–117)
BUN: 18 mg/dL (ref 6–23)
CHLORIDE: 106 meq/L (ref 96–112)
CO2: 27 meq/L (ref 19–32)
CREATININE: 1.19 mg/dL (ref 0.40–1.20)
Calcium: 10.2 mg/dL (ref 8.4–10.5)
GFR: 60.59 mL/min (ref >60.00)
GLUCOSE: 130 mg/dL — AB (ref 70–99)
POTASSIUM: 3.2 meq/L — AB (ref 3.5–5.1)
SODIUM: 143 meq/L (ref 135–145)
Total Bilirubin: 0.6 mg/dL (ref 0.2–1.2)
Total Protein: 8.3 g/dL (ref 6.0–8.3)

## 2017-12-29 LAB — CBC
HCT: 39.4 % (ref 36.0–46.0)
Hemoglobin: 13.3 g/dL (ref 12.0–15.0)
MCHC: 33.8 g/dL (ref 30.0–36.0)
MCV: 96.2 fl (ref 78.0–100.0)
Platelets: 346 10*3/uL (ref 150.0–400.0)
RBC: 4.1 Mil/uL (ref 3.87–5.11)
RDW: 14.4 % (ref 11.5–15.5)
WBC: 6.7 10*3/uL (ref 4.0–10.5)

## 2017-12-29 LAB — VITAMIN B12: VITAMIN B 12: 566 pg/mL (ref 211–911)

## 2017-12-29 LAB — TSH: TSH: 0.96 u[IU]/mL (ref 0.35–4.50)

## 2017-12-29 LAB — VITAMIN D 25 HYDROXY (VIT D DEFICIENCY, FRACTURES): VITD: 25.45 ng/mL — ABNORMAL LOW (ref 30.00–100.00)

## 2017-12-29 MED ORDER — HYDROXYZINE HCL 25 MG PO TABS: 25.0000 mg | ORAL_TABLET | Freq: Three times a day (TID) | ORAL | 0 refills | Status: DC | PRN
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
## 2017-12-29 NOTE — Patient Instructions (Addendum)
Head downstairs for lab work   Start hydroxyzine 25 mg as needed, this medication can cause drowsiness. Please do not drink alcohol or operate machinery when you take this medication. Please do not combine with other medications that can cause drowsiness  I will plan to see you back in 1 month, sooner if needed

## 2017-12-29 NOTE — Assessment & Plan Note (Signed)
BP has improved on current medications but at higher end of normal, will continue current meds for now RTC in 1 month for F/U- recheck BP, consider further medication adjustments if needed - Comprehensive metabolic panel; Future - TSH; Future - CBC; Future

## 2017-12-30 ENCOUNTER — Ambulatory Visit: Payer: Medicare Other

## 2017-12-30 ENCOUNTER — Ambulatory Visit (INDEPENDENT_AMBULATORY_CARE_PROVIDER_SITE_OTHER): Payer: Medicare Other | Admitting: *Deleted

## 2017-12-30 ENCOUNTER — Encounter: Payer: Medicare Other | Admitting: Podiatry

## 2017-12-30 DIAGNOSIS — M2012 Hallux valgus (acquired), left foot: Secondary | ICD-10-CM

## 2017-12-30 NOTE — Progress Notes (Signed)
This encounter was created in error - please disregard.

## 2018-01-03 ENCOUNTER — Other Ambulatory Visit: Payer: Self-pay | Admitting: Nurse Practitioner

## 2018-01-03 DIAGNOSIS — R7989 Other specified abnormal findings of blood chemistry: Secondary | ICD-10-CM

## 2018-01-03 DIAGNOSIS — R899 Unspecified abnormal finding in specimens from other organs, systems and tissues: Secondary | ICD-10-CM

## 2018-01-03 DIAGNOSIS — R945 Abnormal results of liver function studies: Secondary | ICD-10-CM

## 2018-01-03 MED ORDER — POTASSIUM CHLORIDE CRYS ER 20 MEQ PO TBCR: 20.0000 meq | EXTENDED_RELEASE_TABLET | Freq: Every day | ORAL | 0 refills | Status: DC

## 2018-01-10 ENCOUNTER — Ambulatory Visit (HOSPITAL_COMMUNITY): Admitting: Psychiatry

## 2018-01-11 ENCOUNTER — Ambulatory Visit (INDEPENDENT_AMBULATORY_CARE_PROVIDER_SITE_OTHER): Payer: Medicare Other | Admitting: Podiatry

## 2018-01-11 ENCOUNTER — Ambulatory Visit (INDEPENDENT_AMBULATORY_CARE_PROVIDER_SITE_OTHER): Payer: Medicare Other

## 2018-01-11 DIAGNOSIS — M2012 Hallux valgus (acquired), left foot: Secondary | ICD-10-CM | POA: Diagnosis not present

## 2018-01-11 MED ORDER — TRAMADOL HCL 50 MG PO TABS: 50.0000 mg | ORAL_TABLET | Freq: Four times a day (QID) | ORAL | 0 refills | Status: AC | PRN

## 2018-01-12 ENCOUNTER — Other Ambulatory Visit: Payer: Self-pay | Admitting: Nurse Practitioner

## 2018-01-12 DIAGNOSIS — F419 Anxiety disorder, unspecified: Principal | ICD-10-CM

## 2018-01-12 DIAGNOSIS — F329 Major depressive disorder, single episode, unspecified: Secondary | ICD-10-CM

## 2018-01-12 NOTE — Progress Notes (Signed)
She presents today date of surgery 12/03/2017 status post [name] bunionectomy with Akin osteotomy left foot.  She presents today ambulating in her regular shoe gear.  She states that the toe is sore she is been ambulating like this since last visit.  Denies fever chills nausea vomiting muscle aches and pains other than pain in the joint.  Objective: Vital signs are stable she is alert and oriented x3 there is no erythema moderate edema postinflammatory reactive hyperpigmentation is present.  Wound has gone on to heal uneventfully.  She has tenderness on range of motion of the first metatarsal phalangeal joint and of the hallux osteotomy she also has radiographically good position of all internal fixation and osteotomies.  No open wounds.  Assessment: Well-healing surgical foot.  Plan: Encourage range of motion exercises however I encouraged her not to wear regular shoe gear for at least another 2 to 3 weeks she needs to wear her surgical shoe.  I will follow-up with her in about a month to 6 weeks.

## 2018-01-17 NOTE — Telephone Encounter (Signed)
[place] Controlled Database Checked Last filled: 11/22/17 # 30 LOV w/you: 12/29/17 Next appt w/you: 01/28/18

## 2018-01-21 ENCOUNTER — Other Ambulatory Visit: Payer: Self-pay | Admitting: Nurse Practitioner

## 2018-01-21 DIAGNOSIS — L299 Pruritus, unspecified: Secondary | ICD-10-CM

## 2018-01-28 ENCOUNTER — Other Ambulatory Visit (INDEPENDENT_AMBULATORY_CARE_PROVIDER_SITE_OTHER): Payer: Medicare Other

## 2018-01-28 ENCOUNTER — Ambulatory Visit (INDEPENDENT_AMBULATORY_CARE_PROVIDER_SITE_OTHER): Payer: Medicare Other | Admitting: Nurse Practitioner

## 2018-01-28 ENCOUNTER — Encounter: Payer: Self-pay | Admitting: Nurse Practitioner

## 2018-01-28 VITALS — BP 130/90 | HR 90 | Ht 65.0 in | Wt 121.0 lb

## 2018-01-28 DIAGNOSIS — E559 Vitamin D deficiency, unspecified: Secondary | ICD-10-CM

## 2018-01-28 DIAGNOSIS — E119 Type 2 diabetes mellitus without complications: Secondary | ICD-10-CM

## 2018-01-28 DIAGNOSIS — E781 Pure hyperglyceridemia: Secondary | ICD-10-CM

## 2018-01-28 DIAGNOSIS — E876 Hypokalemia: Secondary | ICD-10-CM

## 2018-01-28 DIAGNOSIS — R7989 Other specified abnormal findings of blood chemistry: Secondary | ICD-10-CM

## 2018-01-28 DIAGNOSIS — I10 Essential (primary) hypertension: Secondary | ICD-10-CM

## 2018-01-28 DIAGNOSIS — R945 Abnormal results of liver function studies: Secondary | ICD-10-CM | POA: Diagnosis not present

## 2018-01-28 LAB — LIPID PANEL
Cholesterol: 167 mg/dL (ref 0–200)
HDL: 72.2 mg/dL (ref >39.00)
LDL Cholesterol: 64 mg/dL (ref 0–99)
NonHDL: 94.63
Total CHOL/HDL Ratio: 2
Triglycerides: 151 mg/dL — ABNORMAL HIGH (ref 0.0–149.0)
VLDL: 30.2 mg/dL (ref 0.0–40.0)

## 2018-01-28 LAB — COMPREHENSIVE METABOLIC PANEL
ALT: 14 U/L (ref 0–35)
AST: 20 U/L (ref 0–37)
Albumin: 4.9 g/dL (ref 3.5–5.2)
Alkaline Phosphatase: 99 U/L (ref 39–117)
BUN: 15 mg/dL (ref 6–23)
CO2: 24 mEq/L (ref 19–32)
Calcium: 10.4 mg/dL (ref 8.4–10.5)
Chloride: 104 mEq/L (ref 96–112)
Creatinine, Ser: 1.04 mg/dL (ref 0.40–1.20)
GFR: 70.76 mL/min (ref >60.00)
Glucose, Bld: 88 mg/dL (ref 70–99)
Potassium: 3.5 mEq/L (ref 3.5–5.1)
Sodium: 139 mEq/L (ref 135–145)
Total Bilirubin: 0.5 mg/dL (ref 0.2–1.2)
Total Protein: 8.1 g/dL (ref 6.0–8.3)

## 2018-01-28 LAB — HEMOGLOBIN A1C: Hgb A1c MFr Bld: 6.3 % (ref 4.6–6.5)

## 2018-01-28 MED ORDER — VITAMIN D (ERGOCALCIFEROL) 1.25 MG (50000 UNIT) PO CAPS: 50000.0000 [IU] | ORAL_CAPSULE | ORAL | 0 refills | Status: DC

## 2018-01-28 NOTE — Assessment & Plan Note (Signed)
Recheck labs F/U with further recommendations pending lab results - Comprehensive metabolic panel; Future

## 2018-01-28 NOTE — Assessment & Plan Note (Signed)
Vitamin D Rx resent-dosing, side effects discussed RTC In 3 months for f/u labs She will let me know if she can not pick up Rx - Vitamin D, Ergocalciferol, (DRISDOL) 1.25 MG (50000 UT) CAPS capsule; Take 1 capsule (50,000 Units total) by mouth every 7 (seven) days.  Dispense: 12 capsule; Refill: 0

## 2018-01-28 NOTE — Assessment & Plan Note (Signed)
Continue current medications- she will let us know atorvastatin dosage as it is not in her chart Update labs-she is fasting - Hemoglobin A1c; Future - Lipid panel; Future - Comprehensive metabolic panel; Future

## 2018-01-28 NOTE — Assessment & Plan Note (Signed)
Continue current medications Update labs - Hemoglobin A1c; Future - Lipid panel; Future - Comprehensive metabolic panel; Future

## 2018-01-28 NOTE — Assessment & Plan Note (Signed)
Stable Continue current medications  RTC in 3 months for F/U - recheck BP - Lipid panel; Future - Comprehensive metabolic panel; Future

## 2018-01-28 NOTE — Patient Instructions (Addendum)
Labs downstairs  Let us know what your atorvastatin dose is when we call you with labs.  I will see you in 3 months!
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
## 2018-01-29 LAB — HEPATITIS PANEL, ACUTE
HEP C AB: NONREACTIVE
Hep A IgM: NONREACTIVE
Hep B C IgM: NONREACTIVE
Hepatitis B Surface Ag: NONREACTIVE
SIGNAL TO CUT-OFF: 0.04 (ref <1.00)

## 2018-02-01 ENCOUNTER — Ambulatory Visit (INDEPENDENT_AMBULATORY_CARE_PROVIDER_SITE_OTHER): Admitting: *Deleted

## 2018-02-01 ENCOUNTER — Other Ambulatory Visit: Payer: Self-pay | Admitting: *Deleted

## 2018-02-01 DIAGNOSIS — M2012 Hallux valgus (acquired), left foot: Secondary | ICD-10-CM

## 2018-02-02 ENCOUNTER — Other Ambulatory Visit: Payer: Self-pay | Admitting: Nurse Practitioner

## 2018-02-02 NOTE — Progress Notes (Signed)
error 

## 2018-02-21 ENCOUNTER — Other Ambulatory Visit: Payer: Medicare Other

## 2018-02-22 ENCOUNTER — Ambulatory Visit (INDEPENDENT_AMBULATORY_CARE_PROVIDER_SITE_OTHER): Payer: Medicare Other

## 2018-02-22 ENCOUNTER — Encounter: Payer: Self-pay | Admitting: Podiatry

## 2018-02-22 ENCOUNTER — Ambulatory Visit (INDEPENDENT_AMBULATORY_CARE_PROVIDER_SITE_OTHER): Payer: Medicare Other | Admitting: Podiatry

## 2018-02-22 DIAGNOSIS — M2012 Hallux valgus (acquired), left foot: Secondary | ICD-10-CM

## 2018-02-22 DIAGNOSIS — Z9889 Other specified postprocedural states: Secondary | ICD-10-CM

## 2018-02-22 NOTE — Progress Notes (Signed)
She presents today date of surgery December 03, 2017 with her cam walker.  Should date of surgery as previously noted for an [place] bunionectomy and [place] osteotomy.  She states that she went to [place] to see her son prior to his appointment and drove back and is been hurting ever since.  She states that it has been about a week and is been swollen.  She states that she did a lot of walking but has put her foot back in her Cam walker.  Objective: Vital signs are stable she is alert and oriented x3.  Pulses are palpable.  Mild edema about the foot mild tenderness in the first intermetatarsal space with good range of motion of the first metatarsal phalangeal joint.  Radiographs do demonstrate what appears to be secondary healing of the capital osteotomy.  The Barbie Banner is gone on to heal uneventfully.  Assessment: Well-healing surgical foot.  Plan: Follow-up with me on an as-needed basis.

## 2018-03-15 ENCOUNTER — Other Ambulatory Visit: Payer: Self-pay | Admitting: Nurse Practitioner

## 2018-03-15 DIAGNOSIS — F329 Major depressive disorder, single episode, unspecified: Secondary | ICD-10-CM

## 2018-03-15 DIAGNOSIS — F32A Depression, unspecified: Secondary | ICD-10-CM

## 2018-03-15 DIAGNOSIS — G4700 Insomnia, unspecified: Secondary | ICD-10-CM

## 2018-03-15 DIAGNOSIS — F419 Anxiety disorder, unspecified: Principal | ICD-10-CM

## 2018-03-16 NOTE — Telephone Encounter (Signed)
[place] Controlled Database Checked Last filled: Lorazepam 01/17/18 # 30       Zolpidem 01/12/18 # 90 LOV w/you: 01/28/18 Next appt w/you: 04/29/18

## 2018-03-28 ENCOUNTER — Encounter: Admitting: Gynecology

## 2018-03-28 DIAGNOSIS — Z0289 Encounter for other administrative examinations: Secondary | ICD-10-CM

## 2018-04-26 ENCOUNTER — Other Ambulatory Visit (INDEPENDENT_AMBULATORY_CARE_PROVIDER_SITE_OTHER): Payer: Medicare Other

## 2018-04-26 ENCOUNTER — Encounter: Payer: Self-pay | Admitting: Nurse Practitioner

## 2018-04-26 ENCOUNTER — Ambulatory Visit: Payer: Medicare Other | Admitting: Nurse Practitioner

## 2018-04-26 ENCOUNTER — Ambulatory Visit (INDEPENDENT_AMBULATORY_CARE_PROVIDER_SITE_OTHER): Payer: Medicare Other | Admitting: Nurse Practitioner

## 2018-04-26 VITALS — BP 126/80 | HR 102 | Ht 65.0 in | Wt 127.0 lb

## 2018-04-26 DIAGNOSIS — E559 Vitamin D deficiency, unspecified: Secondary | ICD-10-CM

## 2018-04-26 DIAGNOSIS — I10 Essential (primary) hypertension: Secondary | ICD-10-CM

## 2018-04-26 DIAGNOSIS — L299 Pruritus, unspecified: Secondary | ICD-10-CM

## 2018-04-26 DIAGNOSIS — J441 Chronic obstructive pulmonary disease with (acute) exacerbation: Secondary | ICD-10-CM | POA: Diagnosis not present

## 2018-04-26 LAB — VITAMIN D 25 HYDROXY (VIT D DEFICIENCY, FRACTURES): VITD: 29.42 ng/mL — ABNORMAL LOW (ref 30.00–100.00)

## 2018-04-26 MED ORDER — BENZONATATE 100 MG PO CAPS: 100.0000 mg | ORAL_CAPSULE | Freq: Two times a day (BID) | ORAL | 0 refills | Status: DC | PRN

## 2018-04-26 MED ORDER — ONDANSETRON HCL 4 MG PO TABS: 4.0000 mg | ORAL_TABLET | Freq: Three times a day (TID) | ORAL | 0 refills | Status: DC | PRN

## 2018-04-26 MED ORDER — HYDROXYZINE HCL 25 MG PO TABS: ORAL_TABLET | ORAL | 0 refills | Status: DC

## 2018-04-26 NOTE — Patient Instructions (Signed)
Please head downstairs for lab work today  Please return in 3 months for routine follow up   Hypertension Hypertension, commonly called high blood pressure, is when the force of blood pumping through the arteries is too strong. The arteries are the blood vessels that carry blood from the heart throughout the body. Hypertension forces the heart to work harder to pump blood and may cause arteries to become narrow or stiff. Having untreated or uncontrolled hypertension can cause heart attacks, strokes, kidney disease, and other problems. A blood pressure reading consists of a higher number over a lower number. Ideally, your blood pressure should be below 120/80. The first ("top") number is called the systolic pressure. It is a measure of the pressure in your arteries as your heart beats. The second ("bottom") number is called the diastolic pressure. It is a measure of the pressure in your arteries as the heart relaxes. What are the causes? The cause of this condition is not known. What increases the risk? Some risk factors for high blood pressure are under your control. Others are not. Factors you can change  Smoking.  Having type 2 diabetes mellitus, high cholesterol, or both.  Not getting enough exercise or physical activity.  Being overweight.  Having too much fat, sugar, calories, or salt (sodium) in your diet.  Drinking too much alcohol. Factors that are difficult or impossible to change  Having chronic kidney disease.  Having a family history of high blood pressure.  Age. Risk increases with age.  Race. You may be at higher risk if you are African-American.  Gender. Men are at higher risk than women before age 28. After age 74, women are at higher risk than men.  Having obstructive sleep apnea.  Stress. What are the signs or symptoms? Extremely high blood pressure (hypertensive crisis) may cause:  Headache.  Anxiety.  Shortness of breath.  Nosebleed.  Nausea and  vomiting.  Severe chest pain.  Jerky movements you cannot control (seizures). How is this diagnosed? This condition is diagnosed by measuring your blood pressure while you are seated, with your arm resting on a surface. The cuff of the blood pressure monitor will be placed directly against the skin of your upper arm at the level of your heart. It should be measured at least twice using the same arm. Certain conditions can cause a difference in blood pressure between your right and left arms. Certain factors can cause blood pressure readings to be lower or higher than normal (elevated) for a short period of time:  When your blood pressure is higher when you are in a health care provider's office than when you are at home, this is called white coat hypertension. Most people with this condition do not need medicines.  When your blood pressure is higher at home than when you are in a health care provider's office, this is called masked hypertension. Most people with this condition may need medicines to control blood pressure. If you have a high blood pressure reading during one visit or you have normal blood pressure with other risk factors:  You may be asked to return on a different day to have your blood pressure checked again.  You may be asked to monitor your blood pressure at home for 1 week or longer. If you are diagnosed with hypertension, you may have other blood or imaging tests to help your health care provider understand your overall risk for other conditions. How is this treated? This condition is treated by making  healthy lifestyle changes, such as eating healthy foods, exercising more, and reducing your alcohol intake. Your health care provider may prescribe medicine if lifestyle changes are not enough to get your blood pressure under control, and if:  Your systolic blood pressure is above 130.  Your diastolic blood pressure is above 80. Your personal target blood pressure may vary  depending on your medical conditions, your age, and other factors. Follow these instructions at home: Eating and drinking   Eat a diet that is high in fiber and potassium, and low in sodium, added sugar, and fat. An example eating plan is called the DASH (Dietary Approaches to Stop Hypertension) diet. To eat this way: ? Eat plenty of fresh fruits and vegetables. Try to fill half of your plate at each meal with fruits and vegetables. ? Eat whole grains, such as whole wheat pasta, brown rice, or whole grain bread. Fill about one quarter of your plate with whole grains. ? Eat or drink low-fat dairy products, such as skim milk or low-fat yogurt. ? Avoid fatty cuts of meat, processed or cured meats, and poultry with skin. Fill about one quarter of your plate with lean proteins, such as fish, chicken without skin, beans, eggs, and tofu. ? Avoid premade and processed foods. These tend to be higher in sodium, added sugar, and fat.  Reduce your daily sodium intake. Most people with hypertension should eat less than 1,500 mg of sodium a day.  Limit alcohol intake to no more than 1 drink a day for nonpregnant women and 2 drinks a day for men. One drink equals 12 oz of beer, 5 oz of wine, or 1 oz of hard liquor. Lifestyle   Work with your health care provider to maintain a healthy body weight or to lose weight. Ask what an ideal weight is for you.  Get at least 30 minutes of exercise that causes your heart to beat faster (aerobic exercise) most days of the week. Activities may include walking, swimming, or biking.  Include exercise to strengthen your muscles (resistance exercise), such as pilates or lifting weights, as part of your weekly exercise routine. Try to do these types of exercises for 30 minutes at least 3 days a week.  Do not use any products that contain nicotine or tobacco, such as cigarettes and e-cigarettes. If you need help quitting, ask your health care provider.  Monitor your blood  pressure at home as told by your health care provider.  Keep all follow-up visits as told by your health care provider. This is important. Medicines  Take over-the-counter and prescription medicines only as told by your health care provider. Follow directions carefully. Blood pressure medicines must be taken as prescribed.  Do not skip doses of blood pressure medicine. Doing this puts you at risk for problems and can make the medicine less effective.  Ask your health care provider about side effects or reactions to medicines that you should watch for. Contact a health care provider if:  You think you are having a reaction to a medicine you are taking.  You have headaches that keep coming back (recurring).  You feel dizzy.  You have swelling in your ankles.  You have trouble with your vision. Get help right away if:  You develop a severe headache or confusion.  You have unusual weakness or numbness.  You feel faint.  You have severe pain in your chest or abdomen.  You vomit repeatedly.  You have trouble breathing. Summary  Hypertension is  when the force of blood pumping through your arteries is too strong. If this condition is not controlled, it may put you at risk for serious complications.  Your personal target blood pressure may vary depending on your medical conditions, your age, and other factors. For most people, a normal blood pressure is less than 120/80.  Hypertension is treated with lifestyle changes, medicines, or a combination of both. Lifestyle changes include weight loss, eating a healthy, low-sodium diet, exercising more, and limiting alcohol. This information is not intended to replace advice given to you by your health care provider. Make sure you discuss any questions you have with your health care provider. Document Released: 02/02/2005 Document Revised: 01/01/2016 Document Reviewed: 01/01/2016 Elsevier Interactive Patient Education  2019 [employer].

## 2018-04-26 NOTE — Assessment & Plan Note (Signed)
Update labs F/U with further recommendations pending lab results - VITAMIN D 25 Hydroxy (Vit-D Deficiency, Fractures); Future 

## 2018-04-26 NOTE — Assessment & Plan Note (Signed)
Stable Continue current medications continue to monitor

## 2018-04-26 NOTE — Assessment & Plan Note (Addendum)
No current complaints Tessalon PRN refill sent at her request, she will f/u for any new, worsening symptoms - benzonatate (TESSALON) 100 MG capsule; Take 1 capsule (100 mg total) by mouth 2 (two) times daily as needed for cough.  Dispense: 20 capsule; Refill: 0
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
## 2018-04-27 ENCOUNTER — Other Ambulatory Visit: Payer: Self-pay | Admitting: Nurse Practitioner

## 2018-04-29 ENCOUNTER — Ambulatory Visit: Payer: Medicare Other | Admitting: Nurse Practitioner

## 2018-06-21 ENCOUNTER — Other Ambulatory Visit: Payer: Self-pay

## 2018-06-22 ENCOUNTER — Encounter: Payer: Medicare Other | Admitting: Gynecology

## 2018-06-24 ENCOUNTER — Telehealth: Payer: Self-pay | Admitting: Internal Medicine

## 2018-06-24 NOTE — Telephone Encounter (Signed)
LVM for patient to call back to make appt with Dr. Jenny Reichmann for a transfer of care from [place].

## 2018-06-24 NOTE — Telephone Encounter (Signed)
-----   Message from Biagio Borg, MD sent at 06/18/2018  3:29 PM EDT ----- Regarding: transfer of care Ms Pasty Arch or other Scheduler,  This patient is due for yearly f/u and f/u DM;   Please contact this patient, and ask them to schedule with me as new PCP asap (even same day if possible) when you are able to address this, in order for the patient to continue have updated ongoing care without being lost in the system, and to keep up a high standard of care at [facility].  This visit would be for "office visit" to f/u general medical conditions (such as Diabetics who are due for f/u A1c or other chronic medical problem) or could be CPX if the patient does not necessarily need specific chronic condition follow up and wants this.     These visits can be In Person (if no fever symptoms), or Virtual.  I would prefer in person during day hours at the office, but either is ok.  Exceptions can be made to scheduling of currently ill patients in person who happen to have a current issue such as feeling feverish and UTI symptoms or other infection (just not anything related to possible COVID19 symptoms)  Please be aware I would like 4 Doxy (virtual) visits if possible each Mon/Tues/Wed/Thur evenings that I can do from home.  The template has been changed to reflect the ability to do this, and I will just need to be aware to do this by watching my schedule.

## 2018-06-27 ENCOUNTER — Encounter: Payer: Medicare Other | Admitting: Gynecology

## 2018-07-08 ENCOUNTER — Other Ambulatory Visit: Payer: Self-pay

## 2018-07-12 ENCOUNTER — Encounter: Payer: Medicare Other | Admitting: Gynecology

## 2018-07-13 ENCOUNTER — Encounter: Payer: Medicare Other | Admitting: Gynecology

## 2018-07-20 ENCOUNTER — Encounter: Payer: Self-pay | Admitting: Gynecology

## 2018-07-20 ENCOUNTER — Ambulatory Visit (INDEPENDENT_AMBULATORY_CARE_PROVIDER_SITE_OTHER): Payer: Medicare Other | Admitting: Gynecology

## 2018-07-20 ENCOUNTER — Other Ambulatory Visit: Payer: Self-pay

## 2018-07-20 VITALS — BP 124/76 | Ht 64.0 in | Wt 121.0 lb

## 2018-07-20 DIAGNOSIS — Z79890 Hormone replacement therapy (postmenopausal): Secondary | ICD-10-CM

## 2018-07-20 DIAGNOSIS — N952 Postmenopausal atrophic vaginitis: Secondary | ICD-10-CM

## 2018-07-20 DIAGNOSIS — Z01419 Encounter for gynecological examination (general) (routine) without abnormal findings: Secondary | ICD-10-CM | POA: Diagnosis not present

## 2018-07-20 DIAGNOSIS — Z9189 Other specified personal risk factors, not elsewhere classified: Secondary | ICD-10-CM | POA: Diagnosis not present

## 2018-07-20 MED ORDER — ESTRADIOL 0.5 MG PO TABS: 0.5000 mg | ORAL_TABLET | Freq: Every day | ORAL | 4 refills | Status: DC

## 2018-07-20 MED ORDER — PROGESTERONE MICRONIZED 100 MG PO CAPS: 100.0000 mg | ORAL_CAPSULE | Freq: Every day | ORAL | 4 refills | Status: DC

## 2018-07-20 NOTE — Progress Notes (Signed)
    [name] [birth year] [id number]        58 y.o.  G2P2 for breast and pelvic exam.  Without gynecologic complaints.  Continues on HRT.  Past medical history,surgical history, problem list, medications, allergies, family history and social history were all reviewed and documented as reviewed in the EPIC chart.  ROS:  Performed with pertinent positives and negatives included in the history, assessment and plan.   Additional significant findings : None   Exam: [name] assistant Vitals:   07/20/18 1201  BP: 124/76  Weight: 121 lb (54.9 kg)  Height: 5\' 4"  (1.626 m)   Body mass index is 20.77 kg/m.  General appearance:  Normal affect, orientation and appearance. Skin: Grossly normal HEENT: Without gross lesions.  No cervical or supraclavicular adenopathy. Thyroid normal.  Lungs:  Clear without wheezing, rales or rhonchi Cardiac: RR, without RMG Abdominal:  Soft, nontender, without masses, guarding, rebound, organomegaly or hernia Breasts:  Examined lying and sitting without masses, retractions, discharge or axillary adenopathy. Pelvic:  Ext, BUS, Vagina: With atrophic changes  Cervix: With atrophic changes  Uterus: Anteverted, normal size, shape and contour, midline and mobile nontender   Adnexa: Without masses or tenderness    Anus and perineum: Normal   Rectovaginal: Normal sphincter tone without palpated masses or tenderness.    Assessment/Plan:  58 y.o. G2P2 female for breast and pelvic exam  1. Postmenopausal/HRT.  Continues on estradiol 0.5 mg and Prometrium 100 mg at bedtime.  Doing well without any bleeding.  When she forgets to take her medication she has hot flushes and sweats.  She would like to continue at this time.  We again discussed risks versus benefits to include increased risk of thrombosis such as stroke heart attack DVT in the breast cancer issue.  At this point she is comfortable continuing and I refilled her x1 year. 2. Colonoscopy 2019.   Repeat at their recommended interval. 3. Mammography due now and she will schedule in follow-up for this.  Breast exam normal today. 4. Pap smear/HPV 2019.  No Pap smear done today.  No history of abnormal Pap smears previously.  Plan repeat Pap smear at 5-year interval per current screening guidelines. 5. DEXA never.  Will plan at age 45. 55. Health maintenance.  No routine lab work done as patient does this elsewhere.  Follow-up 1 year, sooner as needed.   Anastasio Auerbach MD, 12:34 PM 07/20/2018

## 2018-07-20 NOTE — Patient Instructions (Signed)
Follow-up in 1 year for annual exam, sooner if any issues. 

## 2018-07-21 ENCOUNTER — Encounter: Payer: Self-pay | Admitting: Internal Medicine

## 2018-07-21 ENCOUNTER — Telehealth: Payer: Self-pay

## 2018-07-21 ENCOUNTER — Other Ambulatory Visit (INDEPENDENT_AMBULATORY_CARE_PROVIDER_SITE_OTHER): Payer: Medicare Other

## 2018-07-21 ENCOUNTER — Other Ambulatory Visit: Payer: Self-pay | Admitting: Internal Medicine

## 2018-07-21 ENCOUNTER — Ambulatory Visit (INDEPENDENT_AMBULATORY_CARE_PROVIDER_SITE_OTHER): Payer: Medicare Other | Admitting: Internal Medicine

## 2018-07-21 DIAGNOSIS — E559 Vitamin D deficiency, unspecified: Secondary | ICD-10-CM

## 2018-07-21 DIAGNOSIS — R109 Unspecified abdominal pain: Secondary | ICD-10-CM

## 2018-07-21 DIAGNOSIS — E611 Iron deficiency: Secondary | ICD-10-CM | POA: Diagnosis not present

## 2018-07-21 DIAGNOSIS — E538 Deficiency of other specified B group vitamins: Secondary | ICD-10-CM

## 2018-07-21 DIAGNOSIS — E119 Type 2 diabetes mellitus without complications: Secondary | ICD-10-CM

## 2018-07-21 DIAGNOSIS — N179 Acute kidney failure, unspecified: Secondary | ICD-10-CM

## 2018-07-21 DIAGNOSIS — I10 Essential (primary) hypertension: Secondary | ICD-10-CM | POA: Diagnosis not present

## 2018-07-21 DIAGNOSIS — D509 Iron deficiency anemia, unspecified: Secondary | ICD-10-CM

## 2018-07-21 HISTORY — DX: Unspecified abdominal pain: R10.9

## 2018-07-21 LAB — URINALYSIS, ROUTINE W REFLEX MICROSCOPIC
Bilirubin Urine: NEGATIVE
Ketones, ur: NEGATIVE
Nitrite: POSITIVE — AB
Specific Gravity, Urine: 1.02 (ref 1.000–1.030)
Urine Glucose: NEGATIVE
Urobilinogen, UA: 0.2 (ref 0.0–1.0)
pH: 6 (ref 5.0–8.0)

## 2018-07-21 LAB — CBC WITH DIFFERENTIAL/PLATELET
Basophils Absolute: 0 10*3/uL (ref 0.0–0.1)
Basophils Relative: 0.6 % (ref 0.0–3.0)
Eosinophils Absolute: 0.1 10*3/uL (ref 0.0–0.7)
Eosinophils Relative: 1.1 % (ref 0.0–5.0)
HCT: 32.1 % — ABNORMAL LOW (ref 36.0–46.0)
Hemoglobin: 10.9 g/dL — ABNORMAL LOW (ref 12.0–15.0)
Lymphocytes Relative: 25.8 % (ref 12.0–46.0)
Lymphs Abs: 1.9 10*3/uL (ref 0.7–4.0)
MCHC: 33.9 g/dL (ref 30.0–36.0)
MCV: 95 fl (ref 78.0–100.0)
Monocytes Absolute: 1 10*3/uL (ref 0.1–1.0)
Monocytes Relative: 13.6 % — ABNORMAL HIGH (ref 3.0–12.0)
Neutro Abs: 4.4 10*3/uL (ref 1.4–7.7)
Neutrophils Relative %: 58.9 % (ref 43.0–77.0)
Platelets: 283 10*3/uL (ref 150.0–400.0)
RBC: 3.38 Mil/uL — ABNORMAL LOW (ref 3.87–5.11)
RDW: 13.9 % (ref 11.5–15.5)
WBC: 7.5 10*3/uL (ref 4.0–10.5)

## 2018-07-21 LAB — HEPATIC FUNCTION PANEL
ALT: 11 U/L (ref 0–35)
AST: 15 U/L (ref 0–37)
Albumin: 4.1 g/dL (ref 3.5–5.2)
Alkaline Phosphatase: 99 U/L (ref 39–117)
Bilirubin, Direct: 0.1 mg/dL (ref 0.0–0.3)
Total Bilirubin: 0.3 mg/dL (ref 0.2–1.2)
Total Protein: 7.9 g/dL (ref 6.0–8.3)

## 2018-07-21 LAB — MICROALBUMIN / CREATININE URINE RATIO
Creatinine,U: 215.7 mg/dL
Microalb Creat Ratio: 4.9 mg/g (ref 0.0–30.0)
Microalb, Ur: 10.5 mg/dL — ABNORMAL HIGH (ref 0.0–1.9)

## 2018-07-21 LAB — LIPID PANEL
Cholesterol: 148 mg/dL (ref 0–200)
HDL: 40.7 mg/dL (ref >39.00)
LDL Cholesterol: 82 mg/dL (ref 0–99)
NonHDL: 107.05
Total CHOL/HDL Ratio: 4
Triglycerides: 125 mg/dL (ref 0.0–149.0)
VLDL: 25 mg/dL (ref 0.0–40.0)

## 2018-07-21 LAB — BASIC METABOLIC PANEL
BUN: 12 mg/dL (ref 6–23)
CO2: 26 mEq/L (ref 19–32)
Calcium: 9.4 mg/dL (ref 8.4–10.5)
Chloride: 100 mEq/L (ref 96–112)
Creatinine, Ser: 1.5 mg/dL — ABNORMAL HIGH (ref 0.40–1.20)
GFR: 43.55 mL/min — ABNORMAL LOW (ref >60.00)
Glucose, Bld: 139 mg/dL — ABNORMAL HIGH (ref 70–99)
Potassium: 3.5 mEq/L (ref 3.5–5.1)
Sodium: 136 mEq/L (ref 135–145)

## 2018-07-21 LAB — IBC PANEL
Iron: 9 ug/dL — ABNORMAL LOW (ref 42–145)
Saturation Ratios: 3 % — ABNORMAL LOW (ref 20.0–50.0)
Transferrin: 217 mg/dL (ref 212.0–360.0)

## 2018-07-21 LAB — VITAMIN B12: Vitamin B-12: 892 pg/mL (ref 211–911)

## 2018-07-21 LAB — VITAMIN D 25 HYDROXY (VIT D DEFICIENCY, FRACTURES): VITD: 32.63 ng/mL (ref 30.00–100.00)

## 2018-07-21 LAB — HEMOGLOBIN A1C: Hgb A1c MFr Bld: 6.2 % (ref 4.6–6.5)

## 2018-07-21 LAB — TSH: TSH: 0.36 u[IU]/mL (ref 0.35–4.50)

## 2018-07-21 MED ORDER — POLYSACCHARIDE IRON COMPLEX 150 MG PO CAPS: 150.0000 mg | ORAL_CAPSULE | Freq: Every day | ORAL | 1 refills | Status: DC

## 2018-07-21 MED ORDER — CIPROFLOXACIN HCL 500 MG PO TABS: 500.0000 mg | ORAL_TABLET | Freq: Two times a day (BID) | ORAL | 0 refills | Status: AC

## 2018-07-21 MED ORDER — HYDROCODONE-ACETAMINOPHEN 7.5-325 MG PO TABS: 1.0000 | ORAL_TABLET | Freq: Four times a day (QID) | ORAL | 0 refills | Status: DC | PRN

## 2018-07-21 NOTE — Telephone Encounter (Signed)
Copied from [place] [id number]. Topic: General - Inquiry >> Jul 21, 2018  1:53 PM Scherrie Gerlach wrote: Reason for CRM: pt went to [facility] imaging for the CT scan.  As soon as they took her temp, they made pt step out of the building. The they came back and advised pt she would need to go to the isolation area.  The nurse would not tell her anything, or if she had a temp.  Pt states she was freaked out because they would not tell her anything, so she left and did not have the CT scan done. Please advise. >> Jul 21, 2018  1:59 PM Scherrie Gerlach wrote: Pt states she did have blood work and UA done about 40 min ago

## 2018-07-21 NOTE — Assessment & Plan Note (Signed)
No prior hx of stones, but hx is suspicious; pt refuses going to ED, for labs as ordered, urine studies, vicodin prn, and stat CT ABD stone protocol

## 2018-07-21 NOTE — Assessment & Plan Note (Signed)
stable overall by history and exam, recent data reviewed with pt, and pt to continue medical treatment as before,  to f/u any worsening symptoms or concerns  

## 2018-07-21 NOTE — Patient Instructions (Signed)
Please take all new medication as prescribed - the pain medication  Please continue all other medications as before, and refills have been done if requested.  Please have the pharmacy call with any other refills you may need.  Please continue your efforts at being more active, low cholesterol diet, and weight control.  You are otherwise up to date with prevention measures today.  Please keep your appointments with your specialists as you may have planned  You will be contacted regarding the referral for: CT scan to make sure of kidney stone  Please go to the LAB in the Basement (turn left off the elevator) for the tests to be done today  You will be contacted by phone if any changes need to be made immediately.  Otherwise, you will receive a letter about your results with an explanation, but please check with MyChart first.  Please remember to sign up for MyChart if you have not done so, as this will be important to you in the future with finding out test results, communicating by private email, and scheduling acute appointments online when needed.  Please return in 6 months, or sooner if needed
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
## 2018-07-21 NOTE — Addendum Note (Signed)
Addended by: Biagio Borg on: 07/21/2018 11:13 AM   Modules accepted: Orders

## 2018-07-21 NOTE — Telephone Encounter (Signed)
Please call [facility] and find out the other side of the story, as there is always another side.  If not reasonable, we should complain about her treatment and seek a stat CT elsewhere

## 2018-07-21 NOTE — Assessment & Plan Note (Signed)
Pt encouraged to cont monitor BP at home and next visit, goal < 140/90

## 2018-07-22 ENCOUNTER — Telehealth: Payer: Self-pay

## 2018-07-22 NOTE — Telephone Encounter (Signed)
-----   Message from Biagio Borg, MD sent at 07/21/2018  5:50 PM EDT ----- Letter sent, cont same tx except  The test results show that your current treatment is OK, except for the finding of urine testing that is consistent with an infection, as well as kidney slowing, and also iron deficiency with mild to moderate anemia.    We need to : 1) start antibiotic I will send to pharmacy 2)  Drink plenty of fluids in the next few days 3)  Repeat kidney testing on Monday July 25, 2018 at the lab 4)  Start Nu iron 1 tab by mouth per day 5)  Refer to Gastroenterology for iron deficiency anemia  Daxson Reffett to please inform pt, I will do rx x 2, referral, and orders for renal function

## 2018-07-22 NOTE — Telephone Encounter (Signed)
Pt has been informed of results and expressed understanding.  °

## 2018-07-23 LAB — URINE CULTURE
MICRO NUMBER:: 537379
SPECIMEN QUALITY:: ADEQUATE

## 2018-07-25 ENCOUNTER — Other Ambulatory Visit (INDEPENDENT_AMBULATORY_CARE_PROVIDER_SITE_OTHER): Payer: Medicare Other

## 2018-07-25 ENCOUNTER — Encounter: Payer: Self-pay | Admitting: Internal Medicine

## 2018-07-25 DIAGNOSIS — N179 Acute kidney failure, unspecified: Secondary | ICD-10-CM

## 2018-07-25 LAB — BASIC METABOLIC PANEL
BUN: 14 mg/dL (ref 6–23)
CO2: 24 mEq/L (ref 19–32)
Calcium: 9.2 mg/dL (ref 8.4–10.5)
Chloride: 106 mEq/L (ref 96–112)
Creatinine, Ser: 1.08 mg/dL (ref 0.40–1.20)
GFR: 63.62 mL/min (ref >60.00)
Glucose, Bld: 94 mg/dL (ref 70–99)
Potassium: 3.9 mEq/L (ref 3.5–5.1)
Sodium: 139 mEq/L (ref 135–145)

## 2018-07-26 LAB — HM DIABETES EYE EXAM

## 2018-08-03 ENCOUNTER — Ambulatory Visit: Payer: Medicare Other | Admitting: Nurse Practitioner

## 2018-08-15 ENCOUNTER — Telehealth: Payer: Self-pay

## 2018-08-15 NOTE — Telephone Encounter (Signed)
Covid-19 screening questions   Do you now or have you had a fever in the last 14 days? No  Do you have any respiratory symptoms of shortness of breath or cough now or in the last 14 days? No  Do you have any family members or close contacts with diagnosed or suspected Covid-19 in the past 14 days? No  Have you been tested for Covid-19 and found to be positive? No        

## 2018-08-16 ENCOUNTER — Ambulatory Visit (INDEPENDENT_AMBULATORY_CARE_PROVIDER_SITE_OTHER): Payer: Medicare Other | Admitting: Internal Medicine

## 2018-08-16 ENCOUNTER — Encounter: Payer: Self-pay | Admitting: Gastroenterology

## 2018-08-16 ENCOUNTER — Ambulatory Visit (INDEPENDENT_AMBULATORY_CARE_PROVIDER_SITE_OTHER): Payer: Medicare Other | Admitting: Gastroenterology

## 2018-08-16 ENCOUNTER — Encounter: Payer: Self-pay | Admitting: Internal Medicine

## 2018-08-16 ENCOUNTER — Other Ambulatory Visit: Payer: Self-pay

## 2018-08-16 VITALS — BP 130/80 | HR 87 | Temp 98.4°F | Ht 65.0 in | Wt 122.4 lb

## 2018-08-16 DIAGNOSIS — G4700 Insomnia, unspecified: Secondary | ICD-10-CM | POA: Diagnosis not present

## 2018-08-16 DIAGNOSIS — E611 Iron deficiency: Secondary | ICD-10-CM

## 2018-08-16 DIAGNOSIS — D509 Iron deficiency anemia, unspecified: Secondary | ICD-10-CM | POA: Diagnosis not present

## 2018-08-16 DIAGNOSIS — N390 Urinary tract infection, site not specified: Secondary | ICD-10-CM | POA: Diagnosis not present

## 2018-08-16 DIAGNOSIS — N179 Acute kidney failure, unspecified: Secondary | ICD-10-CM | POA: Diagnosis not present

## 2018-08-16 MED ORDER — ZOLPIDEM TARTRATE ER 12.5 MG PO TBCR: 12.5000 mg | EXTENDED_RELEASE_TABLET | Freq: Every evening | ORAL | 1 refills | Status: DC | PRN

## 2018-08-16 MED ORDER — POLYSACCHARIDE IRON COMPLEX 150 MG PO CAPS: 150.0000 mg | ORAL_CAPSULE | Freq: Every day | ORAL | 1 refills | Status: DC

## 2018-08-16 NOTE — Progress Notes (Signed)
    History of Present Illness: This is a 58 year old female recently diagnosed with iron deficiency anemia.  Hemoglobin 10.9, iron 9, saturation 3%.  She takes ibuprofen and meloxicam as needed.  She notes a gradual slow weight loss of about 10 pounds over the past couple years.  Reflux symptoms are very well controlled on current regimen.  Colonoscopy in July 2019 was complete to cecum with a good bowel prep and showed 5 small tubular adenoma and internal hemorrhoids. Denies abdominal pain, constipation, diarrhea, change in stool caliber, melena, hematochezia, nausea, vomiting, dysphagia, reflux symptoms, chest pain.   Current Medications, Allergies, Past Medical History, Past Surgical History, Family History and Social History were reviewed in [employer] record.  Physical Exam: General: Well developed, well nourished, no acute distress Head: Normocephalic and atraumatic Eyes:  sclerae anicteric, EOMI Ears: Normal auditory acuity Mouth: No deformity or lesions Lungs: Clear throughout to auscultation Heart: Regular rate and rhythm; no murmurs, rubs or bruits Abdomen: Soft, non tender and non distended. No masses, hepatosplenomegaly or hernias noted. Normal Bowel sounds Rectal: Not done Musculoskeletal: Symmetrical with no gross deformities  Pulses:  Normal pulses noted Extremities: No clubbing, cyanosis, edema or deformities noted Neurological: Alert oriented x 4, grossly nonfocal Psychological:  Alert and cooperative. Normal mood and affect   Assessment and Recommendations:  1. IDA. R/O UGI sources ulcer, gastritis, Cameron erosions. Schedule EGD. Colonoscopy performed in July 2019.  Refill Nu-Iron 150 take 1 twice daily for 3 months.  Hold Nu-Iron 1 day prior to EGD resume after EGD completed. The risks (including bleeding, perforation, infection, missed lesions, medication reactions and possible hospitalization or surgery if complications occur), benefits, and  alternatives to endoscopy with possible biopsy and possible dilation were discussed with the patient and they consent to proceed.   2. GERD.  Follow standard antireflux measures and continue omeprazole 20 mg daily. EGD as above.

## 2018-08-16 NOTE — Patient Instructions (Signed)
OK to change the ambien 10 mg to the ambien cr 12.5 mg at bedtime as needed  Please continue all other medications as before, and refills have been done if requested.  Please have the pharmacy call with any other refills you may need.  Please continue your efforts at being more active, low cholesterol diet, and weight control  Please keep your appointments with your specialists as you may have planned

## 2018-08-16 NOTE — Patient Instructions (Signed)
We have sent the following medications to your pharmacy for you to pick up at your convenience: Nu-Iron.  You have been scheduled for an endoscopy. Please follow written instructions given to you at your visit today. If you use inhalers (even only as needed), please bring them with you on the day of your procedure.  Thank you for choosing me and [facility].  Pricilla Riffle. Dagoberto Ligas., MD., [facility] Regal

## 2018-08-17 ENCOUNTER — Telehealth: Payer: Self-pay | Admitting: Gastroenterology

## 2018-08-17 NOTE — Telephone Encounter (Signed)
Spoke with patient Covid-19 screening questions Covid-19 Screening Questions:  Do you now or have you had a fever in the last 14 days? no  Do you have any respiratory symptoms of shortness of breath or  cough now or in the last 14 days? no  Do you have any family members or close contacts with diagnosed or suspected Covid-19 in the past 14 days? no  Have you been tested for Covid-19 and found to be positive? no  Pt made aware of that care partner may wait in the car or come up to the lobby during the procedure but will need to provide their own mask.

## 2018-08-18 ENCOUNTER — Ambulatory Visit (AMBULATORY_SURGERY_CENTER): Payer: Medicare Other | Admitting: Gastroenterology

## 2018-08-18 ENCOUNTER — Encounter: Payer: Self-pay | Admitting: Gastroenterology

## 2018-08-18 ENCOUNTER — Other Ambulatory Visit: Payer: Self-pay

## 2018-08-18 ENCOUNTER — Other Ambulatory Visit: Payer: Self-pay | Admitting: *Deleted

## 2018-08-18 VITALS — BP 128/74 | HR 94 | Temp 98.6°F | Resp 20 | Ht 65.0 in | Wt 122.0 lb

## 2018-08-18 DIAGNOSIS — K2970 Gastritis, unspecified, without bleeding: Secondary | ICD-10-CM | POA: Diagnosis not present

## 2018-08-18 DIAGNOSIS — F419 Anxiety disorder, unspecified: Secondary | ICD-10-CM

## 2018-08-18 DIAGNOSIS — K2980 Duodenitis without bleeding: Secondary | ICD-10-CM | POA: Diagnosis not present

## 2018-08-18 DIAGNOSIS — K219 Gastro-esophageal reflux disease without esophagitis: Secondary | ICD-10-CM | POA: Diagnosis not present

## 2018-08-18 DIAGNOSIS — K449 Diaphragmatic hernia without obstruction or gangrene: Secondary | ICD-10-CM | POA: Diagnosis not present

## 2018-08-18 DIAGNOSIS — D509 Iron deficiency anemia, unspecified: Secondary | ICD-10-CM

## 2018-08-18 DIAGNOSIS — K210 Gastro-esophageal reflux disease with esophagitis: Secondary | ICD-10-CM | POA: Diagnosis not present

## 2018-08-18 DIAGNOSIS — F329 Major depressive disorder, single episode, unspecified: Secondary | ICD-10-CM

## 2018-08-18 DIAGNOSIS — K2950 Unspecified chronic gastritis without bleeding: Secondary | ICD-10-CM | POA: Diagnosis not present

## 2018-08-18 MED ORDER — FLUCONAZOLE 100 MG PO TABS: 100.0000 mg | ORAL_TABLET | Freq: Every day | ORAL | 0 refills | Status: DC

## 2018-08-18 MED ORDER — BUPROPION HCL 75 MG PO TABS: 75.0000 mg | ORAL_TABLET | Freq: Two times a day (BID) | ORAL | 5 refills | Status: DC

## 2018-08-18 MED ORDER — ESOMEPRAZOLE MAGNESIUM 20 MG PO CPDR: 20.0000 mg | DELAYED_RELEASE_CAPSULE | Freq: Every day | ORAL | 5 refills | Status: DC

## 2018-08-18 MED ORDER — SERTRALINE HCL 100 MG PO TABS: 100.0000 mg | ORAL_TABLET | Freq: Every day | ORAL | 5 refills | Status: DC

## 2018-08-18 MED ORDER — SODIUM CHLORIDE 0.9 % IV SOLN: 500.0000 mL | Freq: Once | INTRAVENOUS | Status: DC

## 2018-08-18 NOTE — Op Note (Signed)
[facility] Patient Name: [name] Procedure Date: 08/18/2018 4:26 PM MRN: [id number] Endoscopist: Ladene Artist , MD Age: 58 Referring MD:  Date of Birth: 11-May-1960 Gender: Female Account #: [id number] Procedure:                Upper GI endoscopy Indications:              Iron deficiency anemia, Gastroesophageal reflux                            disease Medicines:                Monitored Anesthesia Care Procedure:                Pre-Anesthesia Assessment:                           - Prior to the procedure, a History and Physical                            was performed, and patient medications and                            allergies were reviewed. The patient's tolerance of                            previous anesthesia was also reviewed. The risks                            and benefits of the procedure and the sedation                            options and risks were discussed with the patient.                            All questions were answered, and informed consent                            was obtained. Prior Anticoagulants: The patient has                            taken no previous anticoagulant or antiplatelet                            agents. ASA Grade Assessment: III - A patient with                            severe systemic disease. After reviewing the risks                            and benefits, the patient was deemed in                            satisfactory condition to undergo the procedure.  After obtaining informed consent, the endoscope was                            passed under direct vision. Throughout the                            procedure, the patient's blood pressure, pulse, and                            oxygen saturations were monitored continuously. The                            Endoscope was introduced through the mouth, and                            advanced to the second part of duodenum.  The upper                            GI endoscopy was accomplished without difficulty.                            The patient tolerated the procedure well. Scope In: Scope Out: Findings:                 Patchy, yellow plaques were found in the proximal                            esophagus and in the mid esophagus.                           The Z-line was variable and was found 36 cm from                            the incisors. Biopsies were taken with a cold                            forceps for histology.                           The exam of the esophagus was otherwise normal.                           Patchy mildly erythematous mucosa without bleeding                            was found in the entire examined stomach. R/O                            gastritis. Biopsies were taken with a cold forceps                            for histology.                           A small  hiatal hernia was present.                           The exam of the stomach was otherwise normal.                           The duodenal bulb and second portion of the                            duodenum were normal. Biopsies for histology were                            taken with a cold forceps for evaluation of celiac                            disease. Complications:            No immediate complications. Estimated Blood Loss:     Estimated blood loss was minimal. Impression:               - Esophageal plaques were found, consistent with                            candidiasis.                           - Z-line variable, 36 cm from the incisors.                            Biopsied.                           - Erythematous mucosa in the stomach. Biopsied.                           - Small hiatal hernia.                           - Normal duodenal bulb and second portion of the                            duodenum. Biopsied. Recommendation:           - Patient has a contact number available for                             emergencies. The signs and symptoms of potential                            delayed complications were discussed with the                            patient. Return to normal activities tomorrow.                            Written discharge instructions were provided to the  patient.                           - Resume previous diet.                           - Continue present medications including Nexium qd                            and Nu-Iron bid.                           - Diflucan 100 mg po qd, #7, no refills.                           - Await pathology results.                           - Return to GI office in 6 weeks. Ladene Artist, MD 08/18/2018 4:54:13 PM This report has been signed electronically.

## 2018-08-18 NOTE — Progress Notes (Signed)
Pt's states no medical or surgical changes since previsit or office visit.   covid screening and temp by [facility] @ front desk and other vs and blood sugar by Bethena Roys in admitting

## 2018-08-18 NOTE — Patient Instructions (Addendum)
Thank you for allowing [place] to care for you today!  Await pathology results by mail, approximately 2 weeks.  Continue your current medications including Nexium once daily and Nu-Iron twice daily.    Start Diflucan 100 mg once daily by mouth for 7 days.  This prescription has been sent to your [place] on [facility]  Return to your normal activities tomorrow.  Return to GI clinic in 6 weeks.  We will call to schedule this with appointment with you.     YOU HAD AN ENDOSCOPIC PROCEDURE TODAY AT [facility]:   Refer to the procedure report that was given to you for any specific questions about what was found during the examination.  If the procedure report does not answer your questions, please call your gastroenterologist to clarify.  If you requested that your care partner not be given the details of your procedure findings, then the procedure report has been included in a sealed envelope for you to review at your convenience later.  YOU SHOULD EXPECT: Some feelings of bloating in the abdomen. Passage of more gas than usual.  Walking can help get rid of the air that was put into your GI tract during the procedure and reduce the bloating. If you had a lower endoscopy (such as a colonoscopy or flexible sigmoidoscopy) you may notice spotting of blood in your stool or on the toilet paper. If you underwent a bowel prep for your procedure, you may not have a normal bowel movement for a few days.  Please Note:  You might notice some irritation and congestion in your nose or some drainage.  This is from the oxygen used during your procedure.  There is no need for concern and it should clear up in a day or so.  SYMPTOMS TO REPORT IMMEDIATELY:     Following upper endoscopy (EGD)  Vomiting of blood or coffee ground material  New chest pain or pain under the shoulder blades  Painful or persistently difficult swallowing  New shortness of breath  Fever of 100F or  higher  Black, tarry-looking stools  For urgent or emergent issues, a gastroenterologist can be reached at any hour by calling [phone].   DIET:  We do recommend a small meal at first, but then you may proceed to your regular diet.  Drink plenty of fluids but you should avoid alcoholic beverages for 24 hours.  ACTIVITY:  You should plan to take it easy for the rest of today and you should NOT DRIVE or use heavy machinery until tomorrow (because of the sedation medicines used during the test).    FOLLOW UP: Our staff will call the number listed on your records 48-72 hours following your procedure to check on you and address any questions or concerns that you may have regarding the information given to you following your procedure. If we do not reach you, we will leave a message.  We will attempt to reach you two times.  During this call, we will ask if you have developed any symptoms of COVID 19. If you develop any symptoms (ie: fever, flu-like symptoms, shortness of breath, cough etc.) before then, please call [phone].  If you test positive for Covid 19 in the 2 weeks post procedure, please call and report this information to [place].    If any biopsies were taken you will be contacted by phone or by letter within the next 1-3 weeks.  Please call us at [phone] if you have  not heard about the biopsies in 3 weeks.    SIGNATURES/CONFIDENTIALITY: You and/or your care partner have signed paperwork which will be entered into your electronic medical record.  These signatures attest to the fact that that the information above on your After Visit Summary has been reviewed and is understood.  Full responsibility of the confidentiality of this discharge information lies with you and/or your care-partner.

## 2018-08-18 NOTE — Progress Notes (Signed)
Called to room to assist during endoscopic procedure.  Patient ID and intended procedure confirmed with present staff. Received instructions for my participation in the procedure from the performing physician.  

## 2018-08-18 NOTE — Progress Notes (Signed)
A and O x3. Report to RN. Tolerated MAC anesthesia well.Teeth unchanged after procedure.

## 2018-08-19 ENCOUNTER — Encounter: Payer: Self-pay | Admitting: Internal Medicine

## 2018-08-19 DIAGNOSIS — N390 Urinary tract infection, site not specified: Secondary | ICD-10-CM | POA: Insufficient documentation

## 2018-08-19 DIAGNOSIS — E611 Iron deficiency: Secondary | ICD-10-CM | POA: Insufficient documentation

## 2018-08-19 DIAGNOSIS — N179 Acute kidney failure, unspecified: Secondary | ICD-10-CM

## 2018-08-19 HISTORY — DX: Acute kidney failure, unspecified: N17.9

## 2018-08-19 HISTORY — DX: Urinary tract infection, site not specified: N39.0

## 2018-08-19 HISTORY — DX: Iron deficiency: E61.1

## 2018-08-19 NOTE — Assessment & Plan Note (Signed)
Resolved, pt reassured,  to f/u any worsening symptoms or concerns

## 2018-08-19 NOTE — Assessment & Plan Note (Signed)
Stable, to continue replacement, f/u lab with next draw

## 2018-08-19 NOTE — Assessment & Plan Note (Signed)
Improved, cont to avoid nephrotoxins,  to f/u any worsening symptoms or concerns
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
## 2018-08-19 NOTE — Assessment & Plan Note (Signed)
Ok for trial change ambien 10 to [employer] cr 12.5 qh prn

## 2018-08-22 ENCOUNTER — Telehealth: Payer: Self-pay | Admitting: *Deleted

## 2018-08-22 ENCOUNTER — Other Ambulatory Visit: Payer: Self-pay | Admitting: *Deleted

## 2018-08-22 DIAGNOSIS — F329 Major depressive disorder, single episode, unspecified: Secondary | ICD-10-CM

## 2018-08-22 DIAGNOSIS — F32A Depression, unspecified: Secondary | ICD-10-CM

## 2018-08-22 NOTE — Telephone Encounter (Signed)
  Follow up Call-  Call back number 08/18/2018 09/14/2017  Post procedure Call Back phone  # [phone]  Permission to leave phone message Yes Yes     Patient questions:  Do you have a fever, pain , or abdominal swelling? No. Pain Score  0 *  Have you tolerated food without any problems? Yes.    Have you been able to return to your normal activities? Yes.    Do you have any questions about your discharge instructions: Diet   No. Medications  No. Follow up visit  No.  Do you have questions or concerns about your Care? No.  Actions: * If pain score is 4 or above: No action needed, pain <4.

## 2018-08-22 NOTE — Telephone Encounter (Signed)
[facility] Controlled Database Checked Last filled: 05/10/18 # 30 LOV w/you: 08/16/18 Next appt w/you: None

## 2018-08-23 MED ORDER — LORAZEPAM 0.5 MG PO TABS: ORAL_TABLET | ORAL | 0 refills | Status: DC

## 2018-08-23 NOTE — Telephone Encounter (Signed)
Done erx 

## 2018-08-31 ENCOUNTER — Encounter: Payer: Self-pay | Admitting: Gastroenterology

## 2018-09-05 ENCOUNTER — Telehealth: Payer: Self-pay | Admitting: Gastroenterology

## 2018-09-05 NOTE — Telephone Encounter (Signed)
Patient notified of the results and the letter that was mailed.  All questions answered.  She will call back for any additional questions or concerns.

## 2018-09-05 NOTE — Telephone Encounter (Signed)
Patient would like to know if her pathology results are in.

## 2018-11-09 ENCOUNTER — Encounter: Payer: Self-pay | Admitting: Gynecology

## 2018-11-17 ENCOUNTER — Other Ambulatory Visit: Payer: Self-pay

## 2018-11-18 ENCOUNTER — Ambulatory Visit: Payer: Medicare Other | Admitting: Gynecology

## 2018-11-22 ENCOUNTER — Telehealth: Payer: Self-pay | Admitting: Family

## 2018-11-22 ENCOUNTER — Ambulatory Visit: Payer: Medicare Other | Admitting: Internal Medicine

## 2018-11-22 ENCOUNTER — Ambulatory Visit: Payer: Medicare Other | Admitting: Family

## 2018-11-22 NOTE — Telephone Encounter (Signed)
Called [name] has she missed her appointment 11/22/18 at 11:15 to reschedule overdue cardiology follow up. No answer and no voicemail box set up.  Loel Dubonnet, NP
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
## 2018-11-24 ENCOUNTER — Ambulatory Visit: Payer: Medicare Other | Admitting: Gynecology

## 2018-11-24 DIAGNOSIS — Z0289 Encounter for other administrative examinations: Secondary | ICD-10-CM

## 2018-12-13 ENCOUNTER — Encounter: Payer: Self-pay | Admitting: Internal Medicine

## 2018-12-13 ENCOUNTER — Other Ambulatory Visit (INDEPENDENT_AMBULATORY_CARE_PROVIDER_SITE_OTHER): Payer: Medicare Other

## 2018-12-13 ENCOUNTER — Other Ambulatory Visit: Payer: Self-pay

## 2018-12-13 ENCOUNTER — Ambulatory Visit (INDEPENDENT_AMBULATORY_CARE_PROVIDER_SITE_OTHER): Payer: Medicare Other | Admitting: Internal Medicine

## 2018-12-13 ENCOUNTER — Telehealth: Payer: Self-pay | Admitting: Internal Medicine

## 2018-12-13 VITALS — BP 128/86 | HR 78 | Temp 98.6°F | Ht 65.0 in | Wt 119.0 lb

## 2018-12-13 DIAGNOSIS — J449 Chronic obstructive pulmonary disease, unspecified: Secondary | ICD-10-CM

## 2018-12-13 DIAGNOSIS — E119 Type 2 diabetes mellitus without complications: Secondary | ICD-10-CM

## 2018-12-13 DIAGNOSIS — R109 Unspecified abdominal pain: Secondary | ICD-10-CM | POA: Diagnosis not present

## 2018-12-13 DIAGNOSIS — Z720 Tobacco use: Secondary | ICD-10-CM

## 2018-12-13 DIAGNOSIS — F322 Major depressive disorder, single episode, severe without psychotic features: Secondary | ICD-10-CM

## 2018-12-13 DIAGNOSIS — E559 Vitamin D deficiency, unspecified: Secondary | ICD-10-CM | POA: Diagnosis not present

## 2018-12-13 DIAGNOSIS — R634 Abnormal weight loss: Secondary | ICD-10-CM

## 2018-12-13 DIAGNOSIS — F17200 Nicotine dependence, unspecified, uncomplicated: Secondary | ICD-10-CM | POA: Diagnosis not present

## 2018-12-13 DIAGNOSIS — J439 Emphysema, unspecified: Secondary | ICD-10-CM

## 2018-12-13 DIAGNOSIS — E611 Iron deficiency: Secondary | ICD-10-CM | POA: Diagnosis not present

## 2018-12-13 HISTORY — DX: Abnormal weight loss: R63.4

## 2018-12-13 LAB — IBC PANEL
Iron: 144 ug/dL (ref 42–145)
Saturation Ratios: 37.7 % (ref 20.0–50.0)
Transferrin: 273 mg/dL (ref 212.0–360.0)

## 2018-12-13 LAB — CBC WITH DIFFERENTIAL/PLATELET
Basophils Absolute: 0.1 10*3/uL (ref 0.0–0.1)
Basophils Relative: 1.1 % (ref 0.0–3.0)
Eosinophils Absolute: 0.3 10*3/uL (ref 0.0–0.7)
Eosinophils Relative: 5.6 % — ABNORMAL HIGH (ref 0.0–5.0)
HCT: 36.4 % (ref 36.0–46.0)
Hemoglobin: 12.3 g/dL (ref 12.0–15.0)
Lymphocytes Relative: 42 % (ref 12.0–46.0)
Lymphs Abs: 2 10*3/uL (ref 0.7–4.0)
MCHC: 33.9 g/dL (ref 30.0–36.0)
MCV: 95.7 fl (ref 78.0–100.0)
Monocytes Absolute: 0.3 10*3/uL (ref 0.1–1.0)
Monocytes Relative: 6.4 % (ref 3.0–12.0)
Neutro Abs: 2.2 10*3/uL (ref 1.4–7.7)
Neutrophils Relative %: 44.9 % (ref 43.0–77.0)
Platelets: 279 10*3/uL (ref 150.0–400.0)
RBC: 3.81 Mil/uL — ABNORMAL LOW (ref 3.87–5.11)
RDW: 13.8 % (ref 11.5–15.5)
WBC: 4.8 10*3/uL (ref 4.0–10.5)

## 2018-12-13 LAB — HEPATIC FUNCTION PANEL
ALT: 12 U/L (ref 0–35)
AST: 20 U/L (ref 0–37)
Albumin: 4.4 g/dL (ref 3.5–5.2)
Alkaline Phosphatase: 96 U/L (ref 39–117)
Bilirubin, Direct: 0.1 mg/dL (ref 0.0–0.3)
Total Bilirubin: 0.4 mg/dL (ref 0.2–1.2)
Total Protein: 7.7 g/dL (ref 6.0–8.3)

## 2018-12-13 LAB — BASIC METABOLIC PANEL
BUN: 17 mg/dL (ref 6–23)
CO2: 29 mEq/L (ref 19–32)
Calcium: 9.5 mg/dL (ref 8.4–10.5)
Chloride: 105 mEq/L (ref 96–112)
Creatinine, Ser: 1.05 mg/dL (ref 0.40–1.20)
GFR: 65.63 mL/min (ref >60.00)
Glucose, Bld: 89 mg/dL (ref 70–99)
Potassium: 4.2 mEq/L (ref 3.5–5.1)
Sodium: 140 mEq/L (ref 135–145)

## 2018-12-13 LAB — LIPID PANEL
Cholesterol: 207 mg/dL — ABNORMAL HIGH (ref 0–200)
HDL: 67.6 mg/dL (ref >39.00)
LDL Cholesterol: 111 mg/dL — ABNORMAL HIGH (ref 0–99)
NonHDL: 139.33
Total CHOL/HDL Ratio: 3
Triglycerides: 142 mg/dL (ref 0.0–149.0)
VLDL: 28.4 mg/dL (ref 0.0–40.0)

## 2018-12-13 LAB — URINALYSIS, ROUTINE W REFLEX MICROSCOPIC
Bilirubin Urine: NEGATIVE
Hgb urine dipstick: NEGATIVE
Ketones, ur: NEGATIVE
Leukocytes,Ua: NEGATIVE
Nitrite: POSITIVE — AB
RBC / HPF: NONE SEEN (ref 0–?)
Specific Gravity, Urine: >=1.03 — AB (ref 1.000–1.030)
Total Protein, Urine: NEGATIVE
Urine Glucose: NEGATIVE
Urobilinogen, UA: 0.2 (ref 0.0–1.0)
pH: 5.5 (ref 5.0–8.0)

## 2018-12-13 LAB — LIPASE: Lipase: 54 U/L (ref 11.0–59.0)

## 2018-12-13 LAB — HEMOGLOBIN A1C: Hgb A1c MFr Bld: 6.2 % (ref 4.6–6.5)

## 2018-12-13 LAB — MICROALBUMIN / CREATININE URINE RATIO
Creatinine,U: 144.8 mg/dL
Microalb Creat Ratio: 2.4 mg/g (ref 0.0–30.0)
Microalb, Ur: 3.5 mg/dL — ABNORMAL HIGH (ref 0.0–1.9)

## 2018-12-13 MED ORDER — TRAMADOL HCL 50 MG PO TABS: 50.0000 mg | ORAL_TABLET | Freq: Four times a day (QID) | ORAL | 0 refills | Status: DC | PRN

## 2018-12-13 MED ORDER — MEGESTROL ACETATE 40 MG PO TABS: 40.0000 mg | ORAL_TABLET | Freq: Every day | ORAL | 1 refills | Status: DC

## 2018-12-13 NOTE — Telephone Encounter (Signed)
Dx code needed
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
## 2018-12-13 NOTE — Telephone Encounter (Signed)
traMADol (ULTRAM) 50 MG tablet  [facility]  States that they need a nurse call back so they can doc. This refill as to what kind of pain the pt is experiencing before refilling. call bc at [phone]  Katrina

## 2018-12-13 NOTE — Patient Instructions (Addendum)
Please take all new medication as prescribed - the megace for appetite  Please take all new medication as prescribed - the tramadol for pain (but be aware we are only allowed to do 1 wk to start - so call if you need further medication after 1 wk)  Please continue all other medications as before, and refills have been done if requested.  Please have the pharmacy call with any other refills you may need.  Please continue your efforts at being more active, low cholesterol diet, and weight control.  You are otherwise up to date with prevention measures today.  Please keep your appointments with your specialists as you may have planned  You will be contacted regarding the referral for: CT scans for chest/abd/pelvis  Please go to the LAB in the Basement (turn left off the elevator) for the tests to be done today  You will be contacted by phone if any changes need to be made immediately.  Otherwise, you will receive a letter about your results with an explanation, but please check with MyChart first.  Please remember to sign up for MyChart if you have not done so, as this will be important to you in the future with finding out test results, communicating by private email, and scheduling acute appointments online when needed.  You are given the letter today  Please stop smoking  Please return in 3 months, or sooner if needed

## 2018-12-15 LAB — TSH: TSH: 0.74 u[IU]/mL (ref 0.35–4.50)

## 2018-12-15 LAB — VITAMIN D 25 HYDROXY (VIT D DEFICIENCY, FRACTURES): VITD: 21.99 ng/mL — ABNORMAL LOW (ref 30.00–100.00)

## 2018-12-17 ENCOUNTER — Encounter: Payer: Self-pay | Admitting: Internal Medicine

## 2018-12-17 ENCOUNTER — Other Ambulatory Visit: Payer: Self-pay | Admitting: Internal Medicine

## 2018-12-17 MED ORDER — VITAMIN D (ERGOCALCIFEROL) 1.25 MG (50000 UNIT) PO CAPS: 50000.0000 [IU] | ORAL_CAPSULE | ORAL | 0 refills | Status: DC

## 2018-12-17 NOTE — Assessment & Plan Note (Signed)
Also for iron with labs 

## 2018-12-17 NOTE — Assessment & Plan Note (Signed)
Etiology unclear, possibly depression related, for megace 40 qd

## 2018-12-17 NOTE — Assessment & Plan Note (Signed)
Urged to quit 

## 2018-12-17 NOTE — Assessment & Plan Note (Addendum)
Left sided abd pain, for labs and ct chest/abd/pelvis,  to f/u any worsening symptoms or concerns  Note:  Total time for pt hx, exam, review of record with pt in the room, determination of diagnoses and plan for further eval and tx is > 40 min, with over 50% spent in coordination and counseling of patient including the differential dx, tx, further evaluation and other management of  Left flank pain, depression, DM, iron deficiency, vit d def, smoking, wt loss, copd

## 2018-12-17 NOTE — Assessment & Plan Note (Signed)
Also for vit d with labs

## 2018-12-17 NOTE — Assessment & Plan Note (Signed)
Chronic persistent, declines further increased tx or counseling referral

## 2018-12-17 NOTE — Assessment & Plan Note (Signed)
stable overall by history and exam, recent data reviewed with pt, and pt to continue medical treatment as before,  to f/u any worsening symptoms or concerns  

## 2018-12-21 ENCOUNTER — Ambulatory Visit
Admission: RE | Admit: 2018-12-21 | Discharge: 2018-12-21 | Disposition: A | Discharge status: Home / Self Care | Payer: Medicare Other | Source: Ambulatory Visit | Attending: Internal Medicine | Admitting: Internal Medicine

## 2018-12-21 DIAGNOSIS — Z720 Tobacco use: Secondary | ICD-10-CM

## 2018-12-21 DIAGNOSIS — R634 Abnormal weight loss: Secondary | ICD-10-CM

## 2018-12-21 DIAGNOSIS — J439 Emphysema, unspecified: Secondary | ICD-10-CM

## 2018-12-21 DIAGNOSIS — R109 Unspecified abdominal pain: Secondary | ICD-10-CM

## 2018-12-21 DIAGNOSIS — J9811 Atelectasis: Secondary | ICD-10-CM | POA: Diagnosis not present

## 2018-12-21 DIAGNOSIS — N2889 Other specified disorders of kidney and ureter: Secondary | ICD-10-CM | POA: Diagnosis not present

## 2018-12-21 MED ORDER — IOPAMIDOL (ISOVUE-300) INJECTION 61%
100.0000 mL | Freq: Once | INTRAVENOUS | Status: AC | PRN
  Administered 2018-12-21: 15:00:00 100 mL via INTRAVENOUS

## 2018-12-22 ENCOUNTER — Other Ambulatory Visit: Payer: Self-pay | Admitting: Internal Medicine

## 2018-12-22 ENCOUNTER — Encounter: Payer: Self-pay | Admitting: Internal Medicine

## 2018-12-22 DIAGNOSIS — C50012 Malignant neoplasm of nipple and areola, left female breast: Secondary | ICD-10-CM

## 2018-12-22 DIAGNOSIS — N6320 Unspecified lump in the left breast, unspecified quadrant: Secondary | ICD-10-CM

## 2018-12-29 ENCOUNTER — Other Ambulatory Visit: Payer: Medicare Other

## 2019-01-09 ENCOUNTER — Other Ambulatory Visit: Payer: Self-pay

## 2019-01-09 ENCOUNTER — Ambulatory Visit: Payer: Medicare Other

## 2019-01-09 ENCOUNTER — Ambulatory Visit
Admission: RE | Admit: 2019-01-09 | Discharge: 2019-01-09 | Disposition: A | Discharge status: Home / Self Care | Payer: Medicare Other | Source: Ambulatory Visit | Attending: Internal Medicine | Admitting: Internal Medicine

## 2019-01-09 DIAGNOSIS — N6320 Unspecified lump in the left breast, unspecified quadrant: Secondary | ICD-10-CM

## 2019-01-09 DIAGNOSIS — C50012 Malignant neoplasm of nipple and areola, left female breast: Secondary | ICD-10-CM

## 2019-01-09 DIAGNOSIS — R928 Other abnormal and inconclusive findings on diagnostic imaging of breast: Secondary | ICD-10-CM | POA: Diagnosis not present

## 2019-01-16 ENCOUNTER — Other Ambulatory Visit: Payer: Self-pay

## 2019-01-17 ENCOUNTER — Ambulatory Visit (INDEPENDENT_AMBULATORY_CARE_PROVIDER_SITE_OTHER): Payer: Medicare Other | Admitting: Gynecology

## 2019-01-17 ENCOUNTER — Encounter: Payer: Self-pay | Admitting: Gynecology

## 2019-01-17 VITALS — BP 124/80

## 2019-01-17 DIAGNOSIS — N760 Acute vaginitis: Secondary | ICD-10-CM

## 2019-01-17 DIAGNOSIS — N898 Other specified noninflammatory disorders of vagina: Secondary | ICD-10-CM

## 2019-01-17 DIAGNOSIS — Z113 Encounter for screening for infections with a predominantly sexual mode of transmission: Secondary | ICD-10-CM | POA: Diagnosis not present

## 2019-01-17 DIAGNOSIS — B9689 Other specified bacterial agents as the cause of diseases classified elsewhere: Secondary | ICD-10-CM | POA: Diagnosis not present

## 2019-01-17 LAB — WET PREP FOR TRICH, YEAST, CLUE

## 2019-01-17 MED ORDER — CLINDAMYCIN PHOSPHATE 2 % VA CREA: 1.0000 | TOPICAL_CREAM | Freq: Every day | VAGINAL | 0 refills | Status: DC

## 2019-01-17 NOTE — Progress Notes (Signed)
    [name] [birth year] [id number]        58 y.o.  G2P2 presents complaining of vaginal irritation itching and pain with intercourse.  Is been going on for about a month.  Seems to have started after episode of anal sex with subsequent vaginal sex.  No fever chills or urinary symptoms such as frequency dysuria urgency  Past medical history,surgical history, problem list, medications, allergies, family history and social history were all reviewed and documented in the EPIC chart.  Directed ROS with pertinent positives and negatives documented in the history of present illness/assessment and plan.  Exam: [name] assistant Vitals:   01/17/19 1126  BP: 124/80   General appearance:  Normal Abdomen soft nontender without masses guarding rebound Pelvic external BUS vagina with atrophic changes.  White discharge noted.  Wet prep/GC/chlamydia done.  Cervix with atrophic changes.  Uterus normal size midline mobile nontender.  Adnexa without masses or tenderness.  Rectal exam is normal.  Assessment/Plan:  58 y.o. G2P2 with history and exam as above.  Wet prep is positive for bacterial vaginosis.  Options for management reviewed and she elects for Cleocin vaginal cream nightly x7 nights.  Follow-up if symptoms persist, worsen or recur.    Anastasio Auerbach MD, 11:34 AM 01/17/2019

## 2019-01-18 LAB — C. TRACHOMATIS/N. GONORRHOEAE RNA
C. trachomatis RNA, TMA: NOT DETECTED
N. gonorrhoeae RNA, TMA: NOT DETECTED

## 2019-02-12 ENCOUNTER — Other Ambulatory Visit: Payer: Self-pay | Admitting: Internal Medicine

## 2019-02-12 DIAGNOSIS — F329 Major depressive disorder, single episode, unspecified: Secondary | ICD-10-CM

## 2019-02-12 DIAGNOSIS — F32A Depression, unspecified: Secondary | ICD-10-CM

## 2019-02-13 ENCOUNTER — Other Ambulatory Visit: Payer: Self-pay | Admitting: Internal Medicine

## 2019-02-13 NOTE — Telephone Encounter (Signed)
Done erx 

## 2019-02-13 NOTE — Telephone Encounter (Signed)
Please advise 

## 2019-02-13 NOTE — Telephone Encounter (Signed)
Last seen--12/13/18 Last refilled---07/20/18 Please advise

## 2019-03-16 ENCOUNTER — Ambulatory Visit: Payer: Medicare Other | Admitting: Internal Medicine

## 2019-03-20 ENCOUNTER — Ambulatory Visit: Payer: Medicare Other | Admitting: Internal Medicine

## 2019-03-23 ENCOUNTER — Ambulatory Visit: Payer: Medicare Other | Admitting: Internal Medicine

## 2019-07-04 ENCOUNTER — Other Ambulatory Visit: Payer: Self-pay

## 2019-07-04 ENCOUNTER — Encounter: Payer: Self-pay | Admitting: Internal Medicine

## 2019-07-04 ENCOUNTER — Ambulatory Visit (INDEPENDENT_AMBULATORY_CARE_PROVIDER_SITE_OTHER): Payer: Medicare Other | Admitting: Internal Medicine

## 2019-07-04 VITALS — BP 140/82 | HR 106 | Temp 98.2°F | Ht 65.0 in | Wt 116.0 lb

## 2019-07-04 DIAGNOSIS — E559 Vitamin D deficiency, unspecified: Secondary | ICD-10-CM

## 2019-07-04 DIAGNOSIS — E119 Type 2 diabetes mellitus without complications: Secondary | ICD-10-CM

## 2019-07-04 DIAGNOSIS — Z0000 Encounter for general adult medical examination without abnormal findings: Secondary | ICD-10-CM | POA: Diagnosis not present

## 2019-07-04 DIAGNOSIS — G629 Polyneuropathy, unspecified: Secondary | ICD-10-CM

## 2019-07-04 DIAGNOSIS — E781 Pure hyperglyceridemia: Secondary | ICD-10-CM

## 2019-07-04 DIAGNOSIS — K219 Gastro-esophageal reflux disease without esophagitis: Secondary | ICD-10-CM

## 2019-07-04 DIAGNOSIS — I10 Essential (primary) hypertension: Secondary | ICD-10-CM | POA: Diagnosis not present

## 2019-07-04 DIAGNOSIS — F32A Depression, unspecified: Secondary | ICD-10-CM

## 2019-07-04 LAB — BASIC METABOLIC PANEL
BUN: 19 mg/dL (ref 6–23)
CO2: 26 mEq/L (ref 19–32)
Calcium: 9.4 mg/dL (ref 8.4–10.5)
Chloride: 107 mEq/L (ref 96–112)
Creatinine, Ser: 1.06 mg/dL (ref 0.40–1.20)
GFR: 64.79 mL/min (ref >60.00)
Glucose, Bld: 112 mg/dL — ABNORMAL HIGH (ref 70–99)
Potassium: 3.4 mEq/L — ABNORMAL LOW (ref 3.5–5.1)
Sodium: 139 mEq/L (ref 135–145)

## 2019-07-04 LAB — HEPATIC FUNCTION PANEL
ALT: 13 U/L (ref 0–35)
AST: 22 U/L (ref 0–37)
Albumin: 4.4 g/dL (ref 3.5–5.2)
Alkaline Phosphatase: 104 U/L (ref 39–117)
Bilirubin, Direct: 0.1 mg/dL (ref 0.0–0.3)
Total Bilirubin: 0.3 mg/dL (ref 0.2–1.2)
Total Protein: 7.7 g/dL (ref 6.0–8.3)

## 2019-07-04 LAB — LIPID PANEL
Cholesterol: 195 mg/dL (ref 0–200)
HDL: 84.5 mg/dL (ref >39.00)
LDL Cholesterol: 99 mg/dL (ref 0–99)
NonHDL: 110.53
Total CHOL/HDL Ratio: 2
Triglycerides: 57 mg/dL (ref 0.0–149.0)
VLDL: 11.4 mg/dL (ref 0.0–40.0)

## 2019-07-04 LAB — HEMOGLOBIN A1C: Hgb A1c MFr Bld: 5.8 % (ref 4.6–6.5)

## 2019-07-04 MED ORDER — AMLODIPINE BESYLATE 10 MG PO TABS: 10.0000 mg | ORAL_TABLET | Freq: Every day | ORAL | 3 refills | Status: DC

## 2019-07-04 MED ORDER — LORAZEPAM 0.5 MG PO TABS: ORAL_TABLET | ORAL | 1 refills | Status: DC

## 2019-07-04 MED ORDER — ZOLPIDEM TARTRATE ER 12.5 MG PO TBCR: 12.5000 mg | EXTENDED_RELEASE_TABLET | Freq: Every evening | ORAL | 1 refills | Status: DC | PRN

## 2019-07-04 MED ORDER — SERTRALINE HCL 100 MG PO TABS: 100.0000 mg | ORAL_TABLET | Freq: Every day | ORAL | 3 refills | Status: DC

## 2019-07-04 MED ORDER — ESOMEPRAZOLE MAGNESIUM 20 MG PO CPDR: 20.0000 mg | DELAYED_RELEASE_CAPSULE | Freq: Every day | ORAL | 3 refills | Status: DC

## 2019-07-04 MED ORDER — BUPROPION HCL 75 MG PO TABS: 75.0000 mg | ORAL_TABLET | Freq: Two times a day (BID) | ORAL | 3 refills | Status: DC

## 2019-07-04 MED ORDER — VITAMIN D (ERGOCALCIFEROL) 1.25 MG (50000 UNIT) PO CAPS: 50000.0000 [IU] | ORAL_CAPSULE | ORAL | 0 refills | Status: DC

## 2019-07-04 MED ORDER — TRIAMTERENE-HCTZ 37.5-25 MG PO CAPS: 1.0000 | ORAL_CAPSULE | Freq: Every day | ORAL | 3 refills | Status: DC

## 2019-07-04 MED ORDER — METFORMIN HCL 500 MG PO TABS: 500.0000 mg | ORAL_TABLET | Freq: Every day | ORAL | 3 refills | Status: DC

## 2019-07-04 MED ORDER — GABAPENTIN 300 MG PO CAPS: 300.0000 mg | ORAL_CAPSULE | Freq: Every day | ORAL | 1 refills | Status: DC | PRN

## 2019-07-04 MED ORDER — LOSARTAN POTASSIUM 100 MG PO TABS: 100.0000 mg | ORAL_TABLET | Freq: Every day | ORAL | 3 refills | Status: DC

## 2019-07-04 MED ORDER — ATORVASTATIN CALCIUM 20 MG PO TABS: 20.0000 mg | ORAL_TABLET | Freq: Every day | ORAL | 3 refills | Status: DC

## 2019-07-04 NOTE — Assessment & Plan Note (Signed)
stable overall by history and exam, recent data reviewed with pt, and pt to continue medical treatment as before,  to f/u any worsening symptoms or concerns  

## 2019-07-04 NOTE — Addendum Note (Signed)
Addended by: Biagio Borg on: 07/04/2019 09:45 PM   Modules accepted: Orders

## 2019-07-04 NOTE — Patient Instructions (Addendum)
Please change to OTC Vitamin D3 at 2000 units per day, indefinitely.  Please continue all other medications as before, and refills have been done if requested.  Please have the pharmacy call with any other refills you may need.  Please continue your efforts at being more active, low cholesterol diet, and weight control.  You are otherwise up to date with prevention measures today.  Please keep your appointments with your specialists as you may have planned  Please go to the LAB at the blood drawing area for the tests to be done  You will be contacted by phone if any changes need to be made immediately.  Otherwise, you will receive a letter about your results with an explanation, but please check with MyChart first.  Please remember to sign up for MyChart if you have not done so, as this will be important to you in the future with finding out test results, communicating by private email, and scheduling acute appointments online when needed.  Please make an Appointment to return in 6 months, or sooner if needed 
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
## 2019-07-04 NOTE — Assessment & Plan Note (Addendum)
Well controlled, ok to reduce metformin to 500 qam  I spent 31 minutes in preparing to see the patient by review of recent labs, imaging and procedures, obtaining and reviewing separately obtained history, communicating with the patient and family or caregiver, ordering medications, tests or procedures, and documenting clinical information in the EHR including the differential Dx, treatment, and any further evaluation and other management of dm, vit d def, neuropathy, htn, hld, gerd

## 2019-07-04 NOTE — Assessment & Plan Note (Signed)
For oral replacement 

## 2019-07-05 ENCOUNTER — Encounter: Payer: Self-pay | Admitting: Internal Medicine

## 2019-07-05 LAB — VITAMIN D 25 HYDROXY (VIT D DEFICIENCY, FRACTURES): VITD: 34.04 ng/mL (ref 30.00–100.00)

## 2019-07-06 ENCOUNTER — Telehealth: Payer: Self-pay

## 2019-07-06 NOTE — Telephone Encounter (Addendum)
Patient has been advised and has no further questions.

## 2019-07-06 NOTE — Telephone Encounter (Signed)
Key: [id number]

## 2019-07-06 NOTE — Telephone Encounter (Signed)
PA has been approved.   Can you call pt and let her know? She can call her pharmacy when she is ready to pick up her prescription.

## 2019-07-21 ENCOUNTER — Encounter: Payer: Self-pay | Admitting: Obstetrics and Gynecology

## 2019-07-21 ENCOUNTER — Encounter: Payer: Medicare Other | Admitting: Gynecology

## 2019-07-21 DIAGNOSIS — Z0289 Encounter for other administrative examinations: Secondary | ICD-10-CM

## 2019-07-26 ENCOUNTER — Encounter: Payer: Self-pay | Admitting: Obstetrics and Gynecology

## 2019-07-26 ENCOUNTER — Other Ambulatory Visit: Payer: Self-pay

## 2019-07-26 ENCOUNTER — Ambulatory Visit (INDEPENDENT_AMBULATORY_CARE_PROVIDER_SITE_OTHER): Payer: Medicare Other | Admitting: Obstetrics and Gynecology

## 2019-07-26 VITALS — BP 118/76 | Ht 64.0 in | Wt 122.0 lb

## 2019-07-26 DIAGNOSIS — Z79890 Hormone replacement therapy (postmenopausal): Secondary | ICD-10-CM

## 2019-07-26 DIAGNOSIS — N952 Postmenopausal atrophic vaginitis: Secondary | ICD-10-CM

## 2019-07-26 DIAGNOSIS — Z9189 Other specified personal risk factors, not elsewhere classified: Secondary | ICD-10-CM | POA: Diagnosis not present

## 2019-07-26 DIAGNOSIS — Z01419 Encounter for gynecological examination (general) (routine) without abnormal findings: Secondary | ICD-10-CM

## 2019-07-26 MED ORDER — PROGESTERONE MICRONIZED 100 MG PO CAPS
100.0000 mg | ORAL_CAPSULE | Freq: Every day | ORAL | 4 refills | Status: DC
Start: 2019-07-26 — End: 2021-01-27

## 2019-07-26 MED ORDER — ESTRADIOL 0.5 MG PO TABS: 0.5000 mg | ORAL_TABLET | Freq: Every day | ORAL | 4 refills | Status: DC

## 2019-07-26 NOTE — Progress Notes (Signed)
[name] [birth year] [id number]  SUBJECTIVE:  59 y.o. G2P2 female for annual routine gynecologic exam and Pap smear. She has no gynecologic concerns.  Doing well on HRT.  Current Outpatient Medications  Medication Sig Dispense Refill  . albuterol (PROVENTIL HFA;VENTOLIN HFA) 108 (90 Base) MCG/ACT inhaler Inhale 2 puffs into the lungs every 6 (six) hours as needed for wheezing or shortness of breath. 3 Inhaler 3  . amLODipine (NORVASC) 10 MG tablet Take 1 tablet (10 mg total) by mouth daily. 90 tablet 3  . atorvastatin (LIPITOR) 20 MG tablet Take 1 tablet (20 mg total) by mouth daily. 90 tablet 3  . buPROPion (WELLBUTRIN) 75 MG tablet Take 1 tablet (75 mg total) by mouth 2 (two) times daily. 90 tablet 3  . esomeprazole (NEXIUM) 20 MG capsule Take 1 capsule (20 mg total) by mouth daily. 90 capsule 3  . estradiol (ESTRACE) 0.5 MG tablet Take 1 tablet (0.5 mg total) by mouth daily. 90 tablet 4  . gabapentin (NEURONTIN) 300 MG capsule Take 1 capsule (300 mg total) by mouth daily as needed. 90 capsule 1  . iron polysaccharides (NU-IRON) 150 MG capsule Take 1 capsule (150 mg total) by mouth daily. 90 capsule 1  . LORazepam (ATIVAN) 0.5 MG tablet 1 tab by mouth daily as needed 90 tablet 1  . losartan (COZAAR) 100 MG tablet Take 1 tablet (100 mg total) by mouth daily. 90 tablet 3  . megestrol (MEGACE) 40 MG tablet Take 1 tablet (40 mg total) by mouth daily. 90 tablet 1  . metFORMIN (GLUCOPHAGE) 500 MG tablet Take 1 tablet (500 mg total) by mouth daily with breakfast. 90 tablet 3  . ondansetron (ZOFRAN) 4 MG tablet Take 1 tablet (4 mg total) by mouth every 8 (eight) hours as needed for nausea or vomiting. 20 tablet 0  . progesterone (PROMETRIUM) 100 MG capsule Take 1 capsule (100 mg total) by mouth at bedtime. 90 capsule 4  . sertraline (ZOLOFT) 100 MG tablet Take 1 tablet (100 mg total) by mouth daily. 90 tablet 3  . tamsulosin (FLOMAX) 0.4 MG CAPS capsule One tablet daily 30 min after  largest meal of the day    . triamterene-hydrochlorothiazide (DYAZIDE) 37.5-25 MG capsule Take 1 each (1 capsule total) by mouth daily. 90 capsule 3  . Vitamin D, Ergocalciferol, (DRISDOL) 1.25 MG (50000 UNIT) CAPS capsule Take 1 capsule (50,000 Units total) by mouth every 7 (seven) days. 12 capsule 0  . zolpidem (AMBIEN CR) 12.5 MG CR tablet Take 1 tablet (12.5 mg total) by mouth at bedtime as needed for sleep. 90 tablet 1  . benzonatate (TESSALON) 100 MG capsule Take 1 capsule (100 mg total) by mouth 2 (two) times daily as needed for cough. (Patient not taking: Reported on 07/26/2019) 20 capsule 0  . clindamycin (CLEOCIN) 2 % vaginal cream Place 1 Applicatorful vaginally at bedtime. For 7 nights (Patient not taking: Reported on 07/26/2019) 40 g 0  . clotrimazole-betamethasone (LOTRISONE) cream Apply to skin bottom feet twice daily    . fluconazole (DIFLUCAN) 100 MG tablet Take 1 tablet (100 mg total) by mouth daily. (Patient not taking: Reported on 07/26/2019) 7 tablet 0  . hydrOXYzine (ATARAX/VISTARIL) 25 MG tablet TAKE 1 TABLET BY MOUTH THREE TIMES DAILY AS NEEDED FOR  ITCHING (Patient not taking: Reported on 07/26/2019) 30 tablet 0  . ibuprofen (ADVIL,MOTRIN) 800 MG tablet Take 800 mg by mouth every 8 (eight) hours as needed.    Marland Kitchen ketoconazole (NIZORAL) 2 % cream  Apply 1 application topically 2 (two) times daily. (Patient not taking: Reported on 07/26/2019) 15 g 2  . lidocaine (LIDODERM) 5 % REMOVE AND DISCARD PATCH WITHIN 12 HOURS OR AS DIRECTED BY MD    . meloxicam (MOBIC) 15 MG tablet Take 1 tablet (15 mg total) by mouth daily. (Patient not taking: Reported on 07/26/2019) 90 tablet 3  . methocarbamol (ROBAXIN) 500 MG tablet Take 1 tablet (500 mg total) by mouth every 6 (six) hours as needed for muscle spasms. (Patient not taking: Reported on 07/26/2019) 40 tablet 0  . tiZANidine (ZANAFLEX) 4 MG tablet Take 4 mg by mouth every 6 (six) hours as needed.  1   No current facility-administered medications for  this visit.   Allergies: Asa [aspirin], Morphine and related, Oxycodone, and Phenergan [promethazine hcl]  Patient's last menstrual period was 01/16/2005 (approximate).  Past medical history,surgical history, problem list, medications, allergies, family history and social history were all reviewed and documented as reviewed in the EPIC chart.  ROS:  Feeling well. No dyspnea or chest pain on exertion.  No abdominal pain, change in bowel habits, black or bloody stools.  No urinary tract symptoms. GYN ROS: no abnormal bleeding, pelvic pain or discharge, no breast pain or new or enlarging lumps on self exam. No neurological complaints.   OBJECTIVE:  BP 118/76   Ht 5\' 4"  (1.626 m)   Wt 122 lb (55.3 kg)   LMP 01/16/2005 (Approximate) Comment: no menstrual cycle since 2006   BMI 20.94 kg/m  The patient appears well, alert, oriented x 3, in no distress. ENT normal.  Neck supple. No cervical or supraclavicular adenopathy or thyromegaly.  Lungs are clear, good air entry, no wheezes, rhonchi or rales. S1 and S2 normal, no murmurs, regular rate and rhythm.  Abdomen soft without tenderness, guarding, mass or organomegaly.  Neurological is normal, no focal findings.  BREAST EXAM: breasts appear normal, no suspicious masses, no skin or nipple changes or axillary nodes  PELVIC EXAM: VULVA: normal appearing vulva with no masses, tenderness or lesions, atrophic changes, VAGINA: normal appearing vagina with normal color and discharge, no lesions, CERVIX: normal appearing cervix without discharge or lesions, UTERUS: uterus is normal size, shape, consistency and nontender, ADNEXA: normal adnexa in size, nontender and no masses  Chaperone: [name] present during the examination  ASSESSMENT:  59 y.o. G2P2 here for annual gynecologic exam  PLAN:   1. Postmenopausal/HRT.  Continues on estradiol 0.5 mg and Prometrium 100 mg at bedtime.  Doing well without any bleeding, control of hot flashes and night  sweats.  She had forgotten to take it for a weeks in the past and had severe vasomotor symptoms return so she would like to continue.  Thrombotic risks of continued hormone use are reviewed to include heart attack, stroke, DVT, PE, also the breast cancer and endometrial cancer issues.  Refill x1 year is provided. 2. Pap smear/HPV 03/2017.  No significant history of abnormal Pap smears.  Next Pap smear due 2024 following the current guidelines recommending the 5 year interval. 3. Mammogram 12/2018.  Normal breast exam today.  She is reminded to schedule an annual mammogram this year when due. 4. Colonoscopy 2019.  Recommended that she follow up at the recommended interval.   5. DEXA never.  DEXA recommended at age 58. 50. Health maintenance.  No labs today as she normally has these completed with her primary care provider.   Return annually or sooner, prn.  Joseph Pierini MD 07/26/19

## 2019-07-27 ENCOUNTER — Other Ambulatory Visit: Payer: Self-pay

## 2019-07-27 ENCOUNTER — Emergency Department (HOSPITAL_COMMUNITY): Payer: Medicare Other

## 2019-07-27 ENCOUNTER — Emergency Department (HOSPITAL_COMMUNITY)
Admission: EM | Admit: 2019-07-27 | Discharge: 2019-07-27 | Disposition: A | Discharge status: Home / Self Care | Payer: Medicare Other | Attending: Emergency Medicine | Admitting: Emergency Medicine

## 2019-07-27 ENCOUNTER — Ambulatory Visit: Payer: Self-pay

## 2019-07-27 ENCOUNTER — Encounter (HOSPITAL_COMMUNITY): Payer: Self-pay | Admitting: Emergency Medicine

## 2019-07-27 ENCOUNTER — Telehealth: Payer: Self-pay | Admitting: Internal Medicine

## 2019-07-27 DIAGNOSIS — S39012A Strain of muscle, fascia and tendon of lower back, initial encounter: Secondary | ICD-10-CM

## 2019-07-27 DIAGNOSIS — Y939 Activity, unspecified: Secondary | ICD-10-CM | POA: Insufficient documentation

## 2019-07-27 DIAGNOSIS — J449 Chronic obstructive pulmonary disease, unspecified: Secondary | ICD-10-CM | POA: Diagnosis not present

## 2019-07-27 DIAGNOSIS — Z79899 Other long term (current) drug therapy: Secondary | ICD-10-CM | POA: Diagnosis not present

## 2019-07-27 DIAGNOSIS — E119 Type 2 diabetes mellitus without complications: Secondary | ICD-10-CM | POA: Insufficient documentation

## 2019-07-27 DIAGNOSIS — S3992XA Unspecified injury of lower back, initial encounter: Secondary | ICD-10-CM | POA: Diagnosis present

## 2019-07-27 DIAGNOSIS — F17210 Nicotine dependence, cigarettes, uncomplicated: Secondary | ICD-10-CM | POA: Diagnosis not present

## 2019-07-27 DIAGNOSIS — I10 Essential (primary) hypertension: Secondary | ICD-10-CM | POA: Diagnosis not present

## 2019-07-27 DIAGNOSIS — Z7984 Long term (current) use of oral hypoglycemic drugs: Secondary | ICD-10-CM | POA: Insufficient documentation

## 2019-07-27 DIAGNOSIS — R079 Chest pain, unspecified: Secondary | ICD-10-CM | POA: Diagnosis not present

## 2019-07-27 DIAGNOSIS — R0789 Other chest pain: Secondary | ICD-10-CM | POA: Insufficient documentation

## 2019-07-27 DIAGNOSIS — M62830 Muscle spasm of back: Secondary | ICD-10-CM | POA: Insufficient documentation

## 2019-07-27 DIAGNOSIS — J984 Other disorders of lung: Secondary | ICD-10-CM | POA: Diagnosis not present

## 2019-07-27 DIAGNOSIS — Y929 Unspecified place or not applicable: Secondary | ICD-10-CM | POA: Diagnosis not present

## 2019-07-27 DIAGNOSIS — X58XXXA Exposure to other specified factors, initial encounter: Secondary | ICD-10-CM | POA: Insufficient documentation

## 2019-07-27 DIAGNOSIS — Y998 Other external cause status: Secondary | ICD-10-CM | POA: Diagnosis not present

## 2019-07-27 MED ORDER — CYCLOBENZAPRINE HCL 10 MG PO TABS
10.0000 mg | ORAL_TABLET | Freq: Two times a day (BID) | ORAL | 0 refills | Status: DC | PRN
Start: 2019-07-27 — End: 2019-10-16

## 2019-07-27 MED ORDER — ONDANSETRON HCL 4 MG PO TABS: 4.0000 mg | ORAL_TABLET | Freq: Three times a day (TID) | ORAL | 0 refills | Status: DC | PRN

## 2019-07-27 MED ORDER — FENTANYL CITRATE (PF) 100 MCG/2ML IJ SOLN
50.0000 ug | Freq: Once | INTRAMUSCULAR | Status: DC
  Administered 2019-07-27: 50 ug via INTRAVENOUS
  Filled 2019-07-27: qty 2

## 2019-07-27 MED ORDER — SODIUM CHLORIDE 0.9% FLUSH: 3.0000 mL | Freq: Once | INTRAVENOUS | Status: DC

## 2019-07-27 MED ORDER — OXYCODONE HCL 5 MG PO TABS: 5.0000 mg | ORAL_TABLET | Freq: Once | ORAL | Status: DC

## 2019-07-27 MED ORDER — CYCLOBENZAPRINE HCL 10 MG PO TABS
5.0000 mg | ORAL_TABLET | Freq: Once | ORAL | Status: AC
  Administered 2019-07-27: 5 mg via ORAL
  Filled 2019-07-27: qty 1
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
## 2019-07-27 NOTE — Telephone Encounter (Signed)
    Patient called back, stated she returned to ED. Obtained nausea medication

## 2019-07-27 NOTE — Telephone Encounter (Signed)
   Patient calling very upset, crying and screaming. Patient states she was seen in ED today. Since being discharged she has developed nausea and vomiting. Patient requesting Dr Jenny Reichmann prescribe her something for nausea. Patient states she has tried to contact the ED several times, but no one will provide her assistance. Patient states she is afraid to drive back to ED because she was given IV FLEXERIL

## 2019-07-27 NOTE — Telephone Encounter (Signed)
Chart indicates pt received oral flexeril 5 mg only (no IV flexeril)  Etiology of n/v not clear as she also has 10 mg prn on med list  Ok for zofran prn, but consider repeat ED visit for persistent or worsening symptoms

## 2019-07-27 NOTE — Telephone Encounter (Signed)
Pt. Called to ask for medication from her PCP. Warm tranfer to [facility] in the practice.

## 2019-07-27 NOTE — ED Triage Notes (Signed)
Patient states she was walking her dog yesterday and he pulled her. Patient states that she didn't notice any pain until she laid down. Patient is complaining of mid chest pain and lower back pain.

## 2019-07-27 NOTE — Telephone Encounter (Signed)
LVM of Dr. Gwynn Burly note which states pt already has rx for Nausea at the pharmacy. Also lvm stating if pt was to feel worse to please report to ED asap.

## 2019-08-01 ENCOUNTER — Ambulatory Visit (INDEPENDENT_AMBULATORY_CARE_PROVIDER_SITE_OTHER): Payer: Medicare Other | Admitting: Internal Medicine

## 2019-08-01 ENCOUNTER — Ambulatory Visit (HOSPITAL_BASED_OUTPATIENT_CLINIC_OR_DEPARTMENT_OTHER)
Admission: RE | Admit: 2019-08-01 | Discharge: 2019-08-01 | Disposition: A | Discharge status: Home / Self Care | Payer: Medicare Other | Source: Ambulatory Visit | Attending: Internal Medicine | Admitting: Internal Medicine

## 2019-08-01 ENCOUNTER — Other Ambulatory Visit: Payer: Self-pay

## 2019-08-01 ENCOUNTER — Encounter: Payer: Self-pay | Admitting: Internal Medicine

## 2019-08-01 VITALS — BP 120/80 | HR 75 | Temp 98.5°F | Ht 65.0 in | Wt 117.0 lb

## 2019-08-01 DIAGNOSIS — R1032 Left lower quadrant pain: Secondary | ICD-10-CM | POA: Insufficient documentation

## 2019-08-01 DIAGNOSIS — K219 Gastro-esophageal reflux disease without esophagitis: Secondary | ICD-10-CM

## 2019-08-01 DIAGNOSIS — E119 Type 2 diabetes mellitus without complications: Secondary | ICD-10-CM

## 2019-08-01 DIAGNOSIS — M5416 Radiculopathy, lumbar region: Secondary | ICD-10-CM

## 2019-08-01 DIAGNOSIS — K5900 Constipation, unspecified: Secondary | ICD-10-CM | POA: Diagnosis not present

## 2019-08-01 DIAGNOSIS — I10 Essential (primary) hypertension: Secondary | ICD-10-CM

## 2019-08-01 DIAGNOSIS — F419 Anxiety disorder, unspecified: Secondary | ICD-10-CM | POA: Diagnosis not present

## 2019-08-01 HISTORY — DX: Left lower quadrant pain: R10.32

## 2019-08-01 LAB — BASIC METABOLIC PANEL
BUN: 13 mg/dL (ref 6–23)
CO2: 30 mEq/L (ref 19–32)
Calcium: 9.5 mg/dL (ref 8.4–10.5)
Chloride: 105 mEq/L (ref 96–112)
Creatinine, Ser: 1 mg/dL (ref 0.40–1.20)
GFR: 69.27 mL/min (ref >60.00)
Glucose, Bld: 88 mg/dL (ref 70–99)
Potassium: 4.9 mEq/L (ref 3.5–5.1)
Sodium: 139 mEq/L (ref 135–145)

## 2019-08-01 LAB — HEPATIC FUNCTION PANEL
ALT: 14 U/L (ref 0–35)
AST: 24 U/L (ref 0–37)
Albumin: 4.2 g/dL (ref 3.5–5.2)
Alkaline Phosphatase: 97 U/L (ref 39–117)
Bilirubin, Direct: 0.1 mg/dL (ref 0.0–0.3)
Total Bilirubin: 0.4 mg/dL (ref 0.2–1.2)
Total Protein: 7.3 g/dL (ref 6.0–8.3)

## 2019-08-01 LAB — URINALYSIS, ROUTINE W REFLEX MICROSCOPIC
Bilirubin Urine: NEGATIVE
Ketones, ur: NEGATIVE
Leukocytes,Ua: NEGATIVE
Nitrite: NEGATIVE
Specific Gravity, Urine: 1.015 (ref 1.000–1.030)
Total Protein, Urine: NEGATIVE
Urine Glucose: NEGATIVE
Urobilinogen, UA: 0.2 (ref 0.0–1.0)
pH: 6.5 (ref 5.0–8.0)

## 2019-08-01 LAB — CBC WITH DIFFERENTIAL/PLATELET
Basophils Absolute: 0.1 10*3/uL (ref 0.0–0.1)
Basophils Relative: 1.1 % (ref 0.0–3.0)
Eosinophils Absolute: 0.3 10*3/uL (ref 0.0–0.7)
Eosinophils Relative: 5.3 % — ABNORMAL HIGH (ref 0.0–5.0)
HCT: 35.3 % — ABNORMAL LOW (ref 36.0–46.0)
Hemoglobin: 11.7 g/dL — ABNORMAL LOW (ref 12.0–15.0)
Lymphocytes Relative: 31.3 % (ref 12.0–46.0)
Lymphs Abs: 1.9 10*3/uL (ref 0.7–4.0)
MCHC: 33.1 g/dL (ref 30.0–36.0)
MCV: 96.1 fl (ref 78.0–100.0)
Monocytes Absolute: 0.4 10*3/uL (ref 0.1–1.0)
Monocytes Relative: 6.1 % (ref 3.0–12.0)
Neutro Abs: 3.4 10*3/uL (ref 1.4–7.7)
Neutrophils Relative %: 56.2 % (ref 43.0–77.0)
Platelets: 237 10*3/uL (ref 150.0–400.0)
RBC: 3.67 Mil/uL — ABNORMAL LOW (ref 3.87–5.11)
RDW: 13.9 % (ref 11.5–15.5)
WBC: 6 10*3/uL (ref 4.0–10.5)

## 2019-08-01 LAB — LIPASE: Lipase: 21 U/L (ref 11.0–59.0)

## 2019-08-01 MED ORDER — IOHEXOL 300 MG/ML  SOLN
100.0000 mL | Freq: Once | INTRAMUSCULAR | Status: AC | PRN
  Administered 2019-08-01: 75 mL via INTRAVENOUS

## 2019-08-01 NOTE — Patient Instructions (Signed)
Please continue all other medications as before, and refills have been done if requested.  Please have the pharmacy call with any other refills you may need.  Please continue your efforts at being more active, low cholesterol diet, and weight control.  You are otherwise up to date with prevention measures today.  Please keep your appointments with your specialists as you may have planned  You will be contacted regarding the referral for: CT scan (now for the abdominal), and MRI for the lower back (later)  Please go to the LAB at the blood drawing area for the tests to be done  You will be contacted by phone if any changes need to be made immediately.  Otherwise, you will receive a letter about your results with an explanation, but please check with MyChart first.  Please remember to sign up for MyChart if you have not done so, as this will be important to you in the future with finding out test results, communicating by private email, and scheduling acute appointments online when needed.
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
## 2019-08-02 ENCOUNTER — Telehealth: Payer: Self-pay | Admitting: *Deleted

## 2019-08-02 NOTE — Telephone Encounter (Signed)
PA done via cover my meds for progesterone 100 mg capsules, medication was denied by [facility] patient will need to try and fail medroxyprogesterone acetate oral tablets 2.5 mg, 10mg , or 5 mg tablet before progesterone 100 mg tablets can be approved.   Please advise

## 2019-08-02 NOTE — Telephone Encounter (Signed)
I would prefer that she use Prometrium over medroxyprogesterone for safety reasons particularly related to lipid effects and other side effects with medroxyprogesterone, but if we must try that first then I would recommend that she take 5 mg medroxyprogesterone for the first 12 calendar days of each month only to provide endometrial protection

## 2019-08-03 NOTE — Telephone Encounter (Signed)
Left message for patient to call to discuss.  

## 2019-08-06 ENCOUNTER — Encounter: Payer: Self-pay | Admitting: Internal Medicine

## 2019-08-06 NOTE — Assessment & Plan Note (Signed)
stable overall by history and exam, recent data reviewed with pt, and pt to continue medical treatment as before,  to f/u any worsening symptoms or concerns  

## 2019-08-06 NOTE — Assessment & Plan Note (Addendum)
Etiology unclear, differential includes diverticulitis without fever, utI, ovarian etiology, constipation, or even radicular pain from left spinal; for labs abd cT Abd/pelvis now  I spent 41 minutes in preparing to see the patient by review of recent labs, imaging and procedures, obtaining and reviewing separately obtained history, communicating with the patient and family or caregiver, ordering medications, tests or procedures, and documenting clinical information in the EHR including the differential Dx, treatment, and any further evaluation and other management of abd pain llq, gerd, dm, htn, left lumbar radiculopathry, anxiety

## 2019-08-06 NOTE — Assessment & Plan Note (Signed)
For pain control, mRI ls spine, and refer ortho Dr Tonita Cong

## 2019-08-09 ENCOUNTER — Emergency Department (HOSPITAL_COMMUNITY)
Admission: EM | Admit: 2019-08-09 | Discharge: 2019-08-09 | Disposition: A | Discharge status: Home / Self Care | Payer: Medicare Other | Attending: Emergency Medicine | Admitting: Emergency Medicine

## 2019-08-09 ENCOUNTER — Encounter (HOSPITAL_COMMUNITY): Payer: Self-pay | Admitting: Emergency Medicine

## 2019-08-09 ENCOUNTER — Emergency Department (HOSPITAL_COMMUNITY): Payer: Medicare Other

## 2019-08-09 ENCOUNTER — Other Ambulatory Visit: Payer: Self-pay

## 2019-08-09 DIAGNOSIS — Z5321 Procedure and treatment not carried out due to patient leaving prior to being seen by health care provider: Secondary | ICD-10-CM | POA: Diagnosis not present

## 2019-08-09 DIAGNOSIS — M79605 Pain in left leg: Secondary | ICD-10-CM | POA: Diagnosis not present

## 2019-08-09 DIAGNOSIS — M25552 Pain in left hip: Secondary | ICD-10-CM | POA: Diagnosis present

## 2019-08-09 NOTE — ED Triage Notes (Signed)
Patient here from home reporting left hip pain radiating down into leg. Hx of same.

## 2019-08-15 ENCOUNTER — Other Ambulatory Visit: Payer: Medicare Other

## 2019-08-15 NOTE — Telephone Encounter (Signed)
I informed patient with the below note, patient said she picked up progesterone 100 mg and no issues with cost. Patient will continue taking progesterone 100 mg cap.

## 2019-08-16 ENCOUNTER — Telehealth: Payer: Self-pay | Admitting: Cardiology

## 2019-08-16 NOTE — Telephone Encounter (Signed)
Follow Up  Patient returning call. Please give patient a call back.  

## 2019-08-16 NOTE — Telephone Encounter (Addendum)
Attempted to call patient. No answer and voicemail box was full, unable to leave message.

## 2019-08-16 NOTE — Telephone Encounter (Signed)
New message   Patient states that she is having PVC's. Would like a call to discuss and see if she can be worked into schedule before 10/16/19. Please advise.

## 2019-08-16 NOTE — Telephone Encounter (Signed)
Tried calling patient. No answer and voicemail is full so I can not leave a message at this time.  

## 2019-08-16 NOTE — Telephone Encounter (Signed)
Called patient back scheduled her with Dr. Agustin Cree in [place] on August 2nd 2021

## 2019-08-17 ENCOUNTER — Other Ambulatory Visit: Payer: Self-pay

## 2019-08-17 ENCOUNTER — Encounter: Payer: Self-pay | Admitting: Podiatry

## 2019-08-17 ENCOUNTER — Ambulatory Visit (INDEPENDENT_AMBULATORY_CARE_PROVIDER_SITE_OTHER): Payer: Medicare Other

## 2019-08-17 ENCOUNTER — Ambulatory Visit (INDEPENDENT_AMBULATORY_CARE_PROVIDER_SITE_OTHER): Payer: Medicare Other | Admitting: Podiatry

## 2019-08-17 ENCOUNTER — Other Ambulatory Visit: Payer: Self-pay | Admitting: Podiatry

## 2019-08-17 DIAGNOSIS — T847XXA Infection and inflammatory reaction due to other internal orthopedic prosthetic devices, implants and grafts, initial encounter: Secondary | ICD-10-CM

## 2019-08-17 DIAGNOSIS — B351 Tinea unguium: Secondary | ICD-10-CM

## 2019-08-17 DIAGNOSIS — M778 Other enthesopathies, not elsewhere classified: Secondary | ICD-10-CM

## 2019-08-17 DIAGNOSIS — M205X2 Other deformities of toe(s) (acquired), left foot: Secondary | ICD-10-CM | POA: Diagnosis not present

## 2019-08-17 DIAGNOSIS — M79676 Pain in unspecified toe(s): Secondary | ICD-10-CM | POA: Diagnosis not present

## 2019-08-17 NOTE — Progress Notes (Signed)
She presents today complaining of painful hallux left foot.  We did surgery on it for a few years back and she states that is been just sore and achy ever since remarking that her previous surgeon had to remove the internal fixation from the right foot before it was pain-free.  She is also complaining of pain to the second toe left foot.  ROS: Denies fever chills nausea vomiting muscle aches pains calf pain back pain chest pain shortness of breath.  Vital signs are stable she is alert and oriented x3 pulses are palpable.  She has mild edema and erythema overlying the distal aspect of the proximal phalanx hallux.  Mildly tender on palpation.  Radiographs taken today do demonstrate a very rectus of the first metatarsophalangeal joint with internal fixation first metatarsal and proximal phalanx.  These appear to be Stryker screws.  She also has an elongated second toe with a reactive hyperkeratotic lesion distally and thick toenail.  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  Plan: Discussed etiology pathology conservative surgical therapies at this point went ahead and performed a consent form with her today removing internal fixation from the first metatarsal in the proximal phalanx of the hallux.  She understands the possible side effects and complications associated with this I also debrided her reactive hyperkeratotic lesion debrided the nails for her today.  Stating that already felt better.  We will follow-up with her in the near future for surgical intervention.

## 2019-08-28 ENCOUNTER — Other Ambulatory Visit: Payer: Self-pay

## 2019-08-28 ENCOUNTER — Ambulatory Visit
Admission: RE | Admit: 2019-08-28 | Discharge: 2019-08-28 | Disposition: A | Discharge status: Home / Self Care | Payer: Medicare Other | Source: Ambulatory Visit | Attending: Internal Medicine | Admitting: Internal Medicine

## 2019-08-28 DIAGNOSIS — M5416 Radiculopathy, lumbar region: Secondary | ICD-10-CM

## 2019-08-31 ENCOUNTER — Encounter: Payer: Self-pay | Admitting: Internal Medicine

## 2019-08-31 DIAGNOSIS — Z7984 Long term (current) use of oral hypoglycemic drugs: Secondary | ICD-10-CM | POA: Diagnosis not present

## 2019-08-31 DIAGNOSIS — M545 Low back pain: Secondary | ICD-10-CM | POA: Diagnosis not present

## 2019-08-31 DIAGNOSIS — Z885 Allergy status to narcotic agent status: Secondary | ICD-10-CM | POA: Diagnosis not present

## 2019-08-31 DIAGNOSIS — F17210 Nicotine dependence, cigarettes, uncomplicated: Secondary | ICD-10-CM | POA: Diagnosis not present

## 2019-08-31 DIAGNOSIS — Z888 Allergy status to other drugs, medicaments and biological substances status: Secondary | ICD-10-CM | POA: Diagnosis not present

## 2019-08-31 DIAGNOSIS — E119 Type 2 diabetes mellitus without complications: Secondary | ICD-10-CM | POA: Diagnosis not present

## 2019-08-31 DIAGNOSIS — I10 Essential (primary) hypertension: Secondary | ICD-10-CM | POA: Diagnosis not present

## 2019-08-31 DIAGNOSIS — Z886 Allergy status to analgesic agent status: Secondary | ICD-10-CM | POA: Diagnosis not present

## 2019-08-31 DIAGNOSIS — Z79899 Other long term (current) drug therapy: Secondary | ICD-10-CM | POA: Diagnosis not present

## 2019-08-31 DIAGNOSIS — M5416 Radiculopathy, lumbar region: Secondary | ICD-10-CM | POA: Diagnosis not present

## 2019-08-31 DIAGNOSIS — M79605 Pain in left leg: Secondary | ICD-10-CM | POA: Diagnosis not present

## 2019-09-02 DIAGNOSIS — M5030 Other cervical disc degeneration, unspecified cervical region: Secondary | ICD-10-CM | POA: Diagnosis not present

## 2019-09-02 DIAGNOSIS — M545 Low back pain: Secondary | ICD-10-CM | POA: Diagnosis not present

## 2019-09-04 ENCOUNTER — Other Ambulatory Visit: Payer: Medicare Other

## 2019-09-06 DIAGNOSIS — M545 Low back pain: Secondary | ICD-10-CM | POA: Diagnosis not present

## 2019-09-06 DIAGNOSIS — M5136 Other intervertebral disc degeneration, lumbar region: Secondary | ICD-10-CM | POA: Diagnosis not present

## 2019-09-07 ENCOUNTER — Other Ambulatory Visit: Payer: Self-pay | Admitting: Podiatry

## 2019-09-07 DIAGNOSIS — G894 Chronic pain syndrome: Secondary | ICD-10-CM | POA: Diagnosis not present

## 2019-09-07 DIAGNOSIS — M5030 Other cervical disc degeneration, unspecified cervical region: Secondary | ICD-10-CM | POA: Diagnosis not present

## 2019-09-07 DIAGNOSIS — M519 Unspecified thoracic, thoracolumbar and lumbosacral intervertebral disc disorder: Secondary | ICD-10-CM | POA: Diagnosis not present

## 2019-09-07 DIAGNOSIS — M47812 Spondylosis without myelopathy or radiculopathy, cervical region: Secondary | ICD-10-CM | POA: Diagnosis not present

## 2019-09-07 DIAGNOSIS — M5412 Radiculopathy, cervical region: Secondary | ICD-10-CM | POA: Diagnosis not present

## 2019-09-07 DIAGNOSIS — M5416 Radiculopathy, lumbar region: Secondary | ICD-10-CM | POA: Diagnosis not present

## 2019-09-07 MED ORDER — CEPHALEXIN 500 MG PO CAPS
500.0000 mg | ORAL_CAPSULE | Freq: Three times a day (TID) | ORAL | 0 refills | Status: DC
Start: 2019-09-07 — End: 2019-10-16

## 2019-09-07 MED ORDER — ONDANSETRON HCL 4 MG PO TABS: 4.0000 mg | ORAL_TABLET | Freq: Three times a day (TID) | ORAL | 0 refills | Status: DC | PRN

## 2019-09-07 MED ORDER — TRAMADOL HCL 50 MG PO TABS: 50.0000 mg | ORAL_TABLET | Freq: Four times a day (QID) | ORAL | 0 refills | Status: AC | PRN

## 2019-09-08 DIAGNOSIS — M205X2 Other deformities of toe(s) (acquired), left foot: Secondary | ICD-10-CM | POA: Diagnosis not present

## 2019-09-08 DIAGNOSIS — I10 Essential (primary) hypertension: Secondary | ICD-10-CM | POA: Diagnosis not present

## 2019-09-08 DIAGNOSIS — T8484XA Pain due to internal orthopedic prosthetic devices, implants and grafts, initial encounter: Secondary | ICD-10-CM | POA: Diagnosis not present

## 2019-09-08 DIAGNOSIS — Z4889 Encounter for other specified surgical aftercare: Secondary | ICD-10-CM | POA: Diagnosis not present

## 2019-09-08 DIAGNOSIS — M24575 Contracture, left foot: Secondary | ICD-10-CM | POA: Diagnosis not present

## 2019-09-14 ENCOUNTER — Encounter: Payer: Self-pay | Admitting: Podiatry

## 2019-09-14 ENCOUNTER — Ambulatory Visit (INDEPENDENT_AMBULATORY_CARE_PROVIDER_SITE_OTHER): Payer: Medicare Other | Admitting: Podiatry

## 2019-09-14 ENCOUNTER — Other Ambulatory Visit: Payer: Self-pay

## 2019-09-14 ENCOUNTER — Ambulatory Visit (INDEPENDENT_AMBULATORY_CARE_PROVIDER_SITE_OTHER): Payer: Medicare Other

## 2019-09-14 DIAGNOSIS — T847XXA Infection and inflammatory reaction due to other internal orthopedic prosthetic devices, implants and grafts, initial encounter: Secondary | ICD-10-CM | POA: Diagnosis not present

## 2019-09-14 DIAGNOSIS — Z9889 Other specified postprocedural states: Secondary | ICD-10-CM

## 2019-09-14 NOTE — Progress Notes (Signed)
She presents today for her first postop visit date of surgery 09/08/2019 removal painful internal fixation first metatarsal and hallux left.  Flexor tenotomy second digit left.  States that is stinging and I could not take the pain meds anymore because it made me itch.  She denies fever chills nausea vomiting muscle aches pains calf pain back pain chest pain shortness of breath.  Objective: Vital signs are stable she is alert and oriented x3 dry sterile dressing intact to the left foot was removed demonstrates sutures are intact the dorsal aspect of the foot medial aspect of the hallux as well as the plantar aspect of the second toe.  Does not demonstrate any type of infection.  She has good range of motion passively actively she is still terrified to move her toes.  Assessment: Well-healing surgical foot.  Plan: Redressed today dressed a compressive dressing follow-up with me in 1 week for suture removal.

## 2019-09-17 ENCOUNTER — Other Ambulatory Visit: Payer: Medicare Other

## 2019-09-18 ENCOUNTER — Ambulatory Visit: Payer: Medicare Other | Admitting: Cardiology

## 2019-09-19 DIAGNOSIS — M5136 Other intervertebral disc degeneration, lumbar region: Secondary | ICD-10-CM | POA: Diagnosis not present

## 2019-09-19 DIAGNOSIS — M5030 Other cervical disc degeneration, unspecified cervical region: Secondary | ICD-10-CM | POA: Diagnosis not present

## 2019-09-21 ENCOUNTER — Other Ambulatory Visit: Payer: Self-pay

## 2019-09-21 ENCOUNTER — Ambulatory Visit (INDEPENDENT_AMBULATORY_CARE_PROVIDER_SITE_OTHER): Payer: Medicare Other | Admitting: Podiatry

## 2019-09-21 ENCOUNTER — Encounter: Payer: Self-pay | Admitting: Podiatry

## 2019-09-21 DIAGNOSIS — T847XXA Infection and inflammatory reaction due to other internal orthopedic prosthetic devices, implants and grafts, initial encounter: Secondary | ICD-10-CM

## 2019-09-21 DIAGNOSIS — Z9889 Other specified postprocedural states: Secondary | ICD-10-CM

## 2019-09-22 NOTE — Progress Notes (Signed)
She presents today for her second postop visit regarding painful internal fixation and is removal to the hallux and the first metatarsophalangeal joint.  States is feeling much better.  She also had a tenotomy performed on the flexor aspect of the second digit left foot she states that is doing much better looks a lot better than it did still bit tender but all in all it is fine.  Objective: Vitals are stable she alert oriented times 3 sutures are intact margins well coapted sutures were removed margins remain well coapted she had a great range of motion of the first metatarsophalangeal joint no pain on palpation of the incision sites.  No purulence no malodor no signs of infection.  Assessment: Well-healing surgical toes.  Plan: Follow-up with me on an as-needed basis.

## 2019-10-03 DIAGNOSIS — G894 Chronic pain syndrome: Secondary | ICD-10-CM | POA: Diagnosis not present

## 2019-10-03 DIAGNOSIS — M25562 Pain in left knee: Secondary | ICD-10-CM | POA: Diagnosis not present

## 2019-10-03 DIAGNOSIS — G8921 Chronic pain due to trauma: Secondary | ICD-10-CM | POA: Diagnosis not present

## 2019-10-03 DIAGNOSIS — M5030 Other cervical disc degeneration, unspecified cervical region: Secondary | ICD-10-CM | POA: Diagnosis not present

## 2019-10-03 DIAGNOSIS — M47812 Spondylosis without myelopathy or radiculopathy, cervical region: Secondary | ICD-10-CM | POA: Diagnosis not present

## 2019-10-10 ENCOUNTER — Encounter: Payer: Medicare Other | Admitting: Podiatry

## 2019-10-12 ENCOUNTER — Encounter: Payer: Medicare Other | Admitting: Podiatry

## 2019-10-16 ENCOUNTER — Ambulatory Visit (INDEPENDENT_AMBULATORY_CARE_PROVIDER_SITE_OTHER): Payer: Medicare Other | Admitting: Cardiology

## 2019-10-16 ENCOUNTER — Other Ambulatory Visit: Payer: Self-pay | Admitting: Cardiology

## 2019-10-16 ENCOUNTER — Encounter: Payer: Self-pay | Admitting: Cardiology

## 2019-10-16 ENCOUNTER — Other Ambulatory Visit: Payer: Self-pay

## 2019-10-16 VITALS — BP 150/90 | HR 82 | Ht 65.0 in | Wt 125.0 lb

## 2019-10-16 DIAGNOSIS — I10 Essential (primary) hypertension: Secondary | ICD-10-CM | POA: Diagnosis not present

## 2019-10-16 DIAGNOSIS — K219 Gastro-esophageal reflux disease without esophagitis: Secondary | ICD-10-CM | POA: Diagnosis not present

## 2019-10-16 DIAGNOSIS — R072 Precordial pain: Secondary | ICD-10-CM

## 2019-10-16 DIAGNOSIS — IMO0001 Reserved for inherently not codable concepts without codable children: Secondary | ICD-10-CM

## 2019-10-16 DIAGNOSIS — F17200 Nicotine dependence, unspecified, uncomplicated: Secondary | ICD-10-CM | POA: Diagnosis not present

## 2019-10-16 HISTORY — DX: Precordial pain: R07.2

## 2019-10-16 MED ORDER — CLOPIDOGREL BISULFATE 75 MG PO TABS
75.0000 mg | ORAL_TABLET | Freq: Every day | ORAL | 1 refills | Status: DC
Start: 2019-10-16 — End: 2020-05-10

## 2019-10-16 MED ORDER — NITROGLYCERIN 0.4 MG SL SUBL
0.4000 mg | SUBLINGUAL_TABLET | SUBLINGUAL | 11 refills | Status: DC | PRN
Start: 2019-10-16 — End: 2020-03-15

## 2019-10-16 MED ORDER — METOPROLOL TARTRATE 25 MG PO TABS
25.0000 mg | ORAL_TABLET | Freq: Two times a day (BID) | ORAL | 1 refills | Status: DC
Start: 2019-10-16 — End: 2020-03-15

## 2019-10-16 MED FILL — NITROGLYCERIN 0.4 MG TAB SL: 0.4 | 7 days supply | Qty: 25 | Fill #0

## 2019-10-16 MED FILL — CLOPIDOGREL 75 MG TABLET: 75 | 90 days supply | Qty: 90 | Fill #0

## 2019-10-16 MED FILL — METOPROLOL TARTRATE 25 MG T: 25 | 90 days supply | Qty: 180 | Fill #0
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
## 2019-10-16 NOTE — Patient Instructions (Signed)
Medication Instructions:  Your physician has recommended you make the following change in your medication:   START: Plavix 75 mg daily   START: Metoprolol tartrate 25 mg twice daily   TAKE AS NEEDED FOR CHEST PAIN: Nitroglycerin 0.4 mg sublingual (under your tongue) as needed for chest pain. If experiencing chest pain, stop what you are doing and sit down. Take 1 nitroglycerin and wait 5 minutes. If chest pain continues, take another nitroglycerin and wait 5 minutes. If chest pain does not subside, take 1 more nitroglycerin and dial 911. You make take a total of 3 nitroglycerin in a 15 minute time frame.  *If you need a refill on your cardiac medications before your next appointment, please call your pharmacy*   Lab Work: Your physician recommends that you return for lab work 3-7 days before ct: bmp   If you have labs (blood work) drawn today and your tests are completely normal, you will receive your results only by: Marland Kitchen MyChart Message (if you have MyChart) OR . A paper copy in the mail If you have any lab test that is abnormal or we need to change your treatment, we will call you to review the results.   Testing/Procedures: Your cardiac CT will be scheduled at one of the below locations:   [facility] [address] [place], [place] [address] [phone]  [place] [address] [place],  [address] [phone]  If scheduled at [facility], please arrive at the [facility] main entrance of [facility] 30 minutes prior to test start time. Proceed to the [facility] Radiology Department (first floor) to check-in and test prep.  If scheduled at [facility], please arrive 15 mins early for check-in and test prep.  Please follow these instructions carefully (unless otherwise directed):    On the Night Before the Test: . Be sure to Drink plenty of water. . Do  not consume any caffeinated/decaffeinated beverages or chocolate 12 hours prior to your test. . Do not take any antihistamines 12 hours prior to your test.  On the Day of the Test: . Drink plenty of water. Do not drink any water within one hour of the test. . Do not eat any food 4 hours prior to the test. . You may take your regular medications prior to the test.  . Take metoprolol (Lopressor) two hours prior to test. . HOLD triamterene-hydrochlorothiazide (DYAZIDE) 37.5-25 MG capsule the day of the test.  . FEMALES- please wear underwire-free bra if available . Hold metformin the day of test          After the Test: . Drink plenty of water. . After receiving IV contrast, you may experience a mild flushed feeling. This is normal. . On occasion, you may experience a mild rash up to 24 hours after the test. This is not dangerous. If this occurs, you can take Benadryl 25 mg and increase your fluid intake. . If you experience trouble breathing, this can be serious. If it is severe call 911 IMMEDIATELY. If it is mild, please call our office. . If you take any of these medications: Glipizide/Metformin, Avandament, Glucavance, please do not take 48 hours after completing test unless otherwise instructed.   Once we have confirmed authorization from your insurance company, we will call you to set up a date and time for your test. Based on how quickly your insurance processes prior authorizations requests, please allow  up to 4 weeks to be contacted for scheduling your Cardiac CT appointment. Be advised that routine Cardiac CT appointments could be scheduled as many as 8 weeks after your provider has ordered it.  For non-scheduling related questions, please contact the cardiac imaging nurse navigator should you have any questions/concerns: Marchia Bond, Cardiac Imaging Nurse Navigator Burley Saver, Interim Cardiac Imaging Nurse [facility] Direct Office Dial:  [phone]   For scheduling needs, including cancellations and rescheduling, please call Vivien Rota at [phone], option [phone].     Follow-Up: At [facility], you and your health needs are our priority.  As part of our continuing mission to provide you with exceptional heart care, we have created designated Provider Care Teams.  These Care Teams include your primary Cardiologist (physician) and Advanced Practice Providers (APPs -  Physician Assistants and Nurse Practitioners) who all work together to provide you with the care you need, when you need it.  We recommend signing up for the patient portal called "MyChart".  Sign up information is provided on this After Visit Summary.  MyChart is used to connect with patients for Virtual Visits (Telemedicine).  Patients are able to view lab/test results, encounter notes, upcoming appointments, etc.  Non-urgent messages can be sent to your provider as well.   To learn more about what you can do with MyChart, go to [email or web address].    Your next appointment:   2 month(s)  The format for your next appointment:   In Person  Provider:   Jenne Campus, MD   Other Instructions  Clopidogrel tablets What is this medicine? CLOPIDOGREL (kloh PID oh grel) helps to prevent blood clots. This medicine is used to prevent heart attack, stroke, or other vascular events in people who are at high risk. This medicine may be used for other purposes; ask your health care provider or pharmacist if you have questions. COMMON BRAND NAME(S): Plavix What should I tell my health care provider before I take this medicine? They need to know if you have any of the following conditions:  bleeding disorders  bleeding in the brain  having surgery  history of stomach bleeding  an unusual or allergic reaction to clopidogrel, other medicines, foods, dyes, or preservatives  pregnant or trying to get pregnant  breast-feeding How should I use this  medicine? Take this medicine by mouth with a glass of water. Follow the directions on the prescription label. You may take this medicine with or without food. If it upsets your stomach, take it with food. Take your medicine at regular intervals. Do not take it more often than directed. Do not stop taking except on your doctor's advice. A special MedGuide will be given to you by the pharmacist with each prescription and refill. Be sure to read this information carefully each time. Talk to your pediatrician regarding the use of this medicine in children. Special care may be needed. Overdosage: If you think you have taken too much of this medicine contact a poison control center or emergency room at once. NOTE: This medicine is only for you. Do not share this medicine with others. What if I miss a dose? If you miss a dose, take it as soon as you can. If it is almost time for your next dose, take only that dose. Do not take double or extra doses. What may interact with this medicine? Do not take this medicine with the following medications:  dasabuvir; ombitasvir; paritaprevir; ritonavir  defibrotide  selexipag This  medicine may also interact with the following medications:  certain medicines that treat or prevent blood clots like warfarin  narcotic medicines for pain  NSAIDs, medicines for pain and inflammation, like ibuprofen or naproxen  repaglinide  SNRIs, medicines for depression, like desvenlafaxine, duloxetine, levomilnacipran, venlafaxine  SSRIs, medicines for depression, like citalopram, escitalopram, fluoxetine, fluvoxamine, paroxetine, sertraline  stomach acid blockers like cimetidine, esomeprazole, omeprazole This list may not describe all possible interactions. Give your health care provider a list of all the medicines, herbs, non-prescription drugs, or dietary supplements you use. Also tell them if you smoke, drink alcohol, or use illegal drugs. Some items may interact with  your medicine. What should I watch for while using this medicine? Visit your doctor or health care professional for regular check-ups. Do not stop taking your medicine unless your doctor tells you to. Notify your doctor or health care professional and seek emergency treatment if you develop breathing problems; changes in vision; chest pain; severe, sudden headache; pain, swelling, warmth in the leg; trouble speaking; sudden numbness or weakness of the face, arm or leg. These can be signs that your condition has gotten worse. If you are going to have surgery or dental work, tell your doctor or health care professional that you are taking this medicine. Certain genetic factors may reduce the effect of this medicine. Your doctor may use genetic tests to determine treatment. Only take aspirin if you are instructed to. Low doses of aspirin are used with this medicine to treat some conditions. Taking aspirin with this medicine can increase your risk of bleeding so you must be careful. Talk to your doctor or pharmacist if you have questions. What side effects may I notice from receiving this medicine? Side effects that you should report to your doctor or health care professional as soon as possible:  allergic reactions like skin rash, itching or hives, swelling of the face, lips, or tongue  signs and symptoms of bleeding such as bloody or black, tarry stools; red or dark-brown urine; spitting up blood or brown material that looks like coffee grounds; red spots on the skin; unusual bruising or bleeding from the eye, gums, or nose  signs and symptoms of a blood clot such as breathing problems; changes in vision; chest pain; severe, sudden headache; pain, swelling, warmth in the leg; trouble speaking; sudden numbness or weakness of the face, arm or leg  signs and symptoms of low blood sugar such as feeling anxious; confusion; dizziness; increased hunger; unusually weak or tired; increased sweating; shakiness;  cold, clammy skin; irritable; headache; blurred vision; fast heartbeat; loss of consciousness Side effects that usually do not require medical attention (report to your doctor or health care professional if they continue or are bothersome):  constipation  diarrhea  headache  upset stomach This list may not describe all possible side effects. Call your doctor for medical advice about side effects. You may report side effects to FDA at 1-800-FDA-1088. Where should I keep my medicine? Keep out of the reach of children. Store at room temperature of 59 to 86 degrees F (15 to 30 degrees C). Throw away any unused medicine after the expiration date. NOTE: This sheet is a summary. It may not cover all possible information. If you have questions about this medicine, talk to your doctor, pharmacist, or health care provider.  2020 Elsevier/Gold Standard (2017-07-05 15:03:38)  Metoprolol Tablets What is this medicine? METOPROLOL (me TOE proe lole) is a beta blocker. It decreases the amount of work your  heart has to do and helps your heart beat regularly. It is used to treat high blood pressure and/or prevent chest pain (also called angina). It is also used after a heart attack to prevent a second one. This medicine may be used for other purposes; ask your health care provider or pharmacist if you have questions. COMMON BRAND NAME(S): Lopressor What should I tell my health care provider before I take this medicine? They need to know if you have any of these conditions:  diabetes  heart or vessel disease like slow heart rate, worsening heart failure, heart block, sick sinus syndrome or Raynaud's disease  kidney disease  liver disease  lung or breathing disease, like asthma or emphysema  pheochromocytoma  thyroid disease  an unusual or allergic reaction to metoprolol, other beta-blockers, medicines, foods, dyes, or preservatives  pregnant or trying to get pregnant  breast-feeding How  should I use this medicine? Take this drug by mouth with water. Take it as directed on the prescription label at the same time every day. You can take it with or without food. You should always take it the same way. Keep taking it unless your health care provider tells you to stop. Talk to your health care provider about the use of this drug in children. Special care may be needed. Overdosage: If you think you have taken too much of this medicine contact a poison control center or emergency room at once. NOTE: This medicine is only for you. Do not share this medicine with others. What if I miss a dose? If you miss a dose, take it as soon as you can. If it is almost time for your next dose, take only that dose. Do not take double or extra doses. What may interact with this medicine? This medicine may interact with the following medications:  certain medicines for blood pressure, heart disease, irregular heart beat  certain medicines for depression like monoamine oxidase (MAO) inhibitors, fluoxetine, or paroxetine  clonidine  dobutamine  epinephrine  isoproterenol  reserpine This list may not describe all possible interactions. Give your health care provider a list of all the medicines, herbs, non-prescription drugs, or dietary supplements you use. Also tell them if you smoke, drink alcohol, or use illegal drugs. Some items may interact with your medicine. What should I watch for while using this medicine? Visit your doctor or health care professional for regular check ups. Contact your doctor right away if your symptoms worsen. Check your blood pressure and pulse rate regularly. Ask your health care professional what your blood pressure and pulse rate should be, and when you should contact them. You may get drowsy or dizzy. Do not drive, use machinery, or do anything that needs mental alertness until you know how this medicine affects you. Do not sit or stand up quickly, especially if you  are an older patient. This reduces the risk of dizzy or fainting spells. Contact your doctor if these symptoms continue. Alcohol may interfere with the effect of this medicine. Avoid alcoholic drinks. This medicine may increase blood sugar. Ask your healthcare provider if changes in diet or medicines are needed if you have diabetes. What side effects may I notice from receiving this medicine? Side effects that you should report to your doctor or health care professional as soon as possible:  allergic reactions like skin rash, itching or hives  cold or numb hands or feet  depression  difficulty breathing  faint  fever with sore throat  irregular heartbeat,  chest pain  rapid weight gain   signs and symptoms of high blood sugar such as being more thirsty or hungry or having to urinate more than normal. You may also feel very tired or have blurry vision.  swollen legs or ankles Side effects that usually do not require medical attention (report to your doctor or health care professional if they continue or are bothersome):  anxiety or nervousness  change in sex drive or performance  dry skin  headache  nightmares or trouble sleeping  short term memory loss  stomach upset or diarrhea This list may not describe all possible side effects. Call your doctor for medical advice about side effects. You may report side effects to FDA at 1-800-FDA-1088. Where should I keep my medicine? Keep out of the reach of children and pets. Store at room temperature between 15 and 30 degrees C (59 and 86 degrees F). Protect from moisture. Keep the container tightly closed. Throw away any unused drug after the expiration date. NOTE: This sheet is a summary. It may not cover all possible information. If you have questions about this medicine, talk to your doctor, pharmacist, or health care provider.  2020 Elsevier/Gold Standard (2018-09-15 17:21:17)  Nitroglycerin sublingual tablets What is this  medicine? NITROGLYCERIN (nye troe GLI ser in) is a type of vasodilator. It relaxes blood vessels, increasing the blood and oxygen supply to your heart. This medicine is used to relieve chest pain caused by angina. It is also used to prevent chest pain before activities like climbing stairs, going outdoors in cold weather, or sexual activity. This medicine may be used for other purposes; ask your health care provider or pharmacist if you have questions. COMMON BRAND NAME(S): Nitroquick, Nitrostat, Nitrotab What should I tell my health care provider before I take this medicine? They need to know if you have any of these conditions:  anemia  head injury, recent stroke, or bleeding in the brain  liver disease  previous heart attack  an unusual or allergic reaction to nitroglycerin, other medicines, foods, dyes, or preservatives  pregnant or trying to get pregnant  breast-feeding How should I use this medicine? Take this medicine by mouth as needed. At the first sign of an angina attack (chest pain or tightness) place one tablet under your tongue. You can also take this medicine 5 to 10 minutes before an event likely to produce chest pain. Follow the directions on the prescription label. Let the tablet dissolve under the tongue. Do not swallow whole. Replace the dose if you accidentally swallow it. It will help if your mouth is not dry. Saliva around the tablet will help it to dissolve more quickly. Do not eat or drink, smoke or chew tobacco while a tablet is dissolving. If you are not better within 5 minutes after taking ONE dose of nitroglycerin, call 9-1-1 immediately to seek emergency medical care. Do not take more than 3 nitroglycerin tablets over 15 minutes. If you take this medicine often to relieve symptoms of angina, your doctor or health care professional may provide you with different instructions to manage your symptoms. If symptoms do not go away after following these instructions, it  is important to call 9-1-1 immediately. Do not take more than 3 nitroglycerin tablets over 15 minutes. Talk to your pediatrician regarding the use of this medicine in children. Special care may be needed. Overdosage: If you think you have taken too much of this medicine contact a poison control center or emergency room at once.  NOTE: This medicine is only for you. Do not share this medicine with others. What if I miss a dose? This does not apply. This medicine is only used as needed. What may interact with this medicine? Do not take this medicine with any of the following medications:  certain migraine medicines like ergotamine and dihydroergotamine (DHE)  medicines used to treat erectile dysfunction like sildenafil, tadalafil, and vardenafil  riociguat This medicine may also interact with the following medications:  alteplase  aspirin  heparin  medicines for high blood pressure  medicines for mental depression  other medicines used to treat angina  phenothiazines like chlorpromazine, mesoridazine, prochlorperazine, thioridazine This list may not describe all possible interactions. Give your health care provider a list of all the medicines, herbs, non-prescription drugs, or dietary supplements you use. Also tell them if you smoke, drink alcohol, or use illegal drugs. Some items may interact with your medicine. What should I watch for while using this medicine? Tell your doctor or health care professional if you feel your medicine is no longer working. Keep this medicine with you at all times. Sit or lie down when you take your medicine to prevent falling if you feel dizzy or faint after using it. Try to remain calm. This will help you to feel better faster. If you feel dizzy, take several deep breaths and lie down with your feet propped up, or bend forward with your head resting between your knees. You may get drowsy or dizzy. Do not drive, use machinery, or do anything that needs  mental alertness until you know how this drug affects you. Do not stand or sit up quickly, especially if you are an older patient. This reduces the risk of dizzy or fainting spells. Alcohol can make you more drowsy and dizzy. Avoid alcoholic drinks. Do not treat yourself for coughs, colds, or pain while you are taking this medicine without asking your doctor or health care professional for advice. Some ingredients may increase your blood pressure. What side effects may I notice from receiving this medicine? Side effects that you should report to your doctor or health care professional as soon as possible:  blurred vision  dry mouth  skin rash  sweating  the feeling of extreme pressure in the head  unusually weak or tired Side effects that usually do not require medical attention (report to your doctor or health care professional if they continue or are bothersome):  flushing of the face or neck  headache  irregular heartbeat, palpitations  nausea, vomiting This list may not describe all possible side effects. Call your doctor for medical advice about side effects. You may report side effects to FDA at 1-800-FDA-1088. Where should I keep my medicine? Keep out of the reach of children. Store at room temperature between 20 and 25 degrees C (68 and 77 degrees F). Store in [facility] [employer]. Protect from light and moisture. Keep tightly closed. Throw away any unused medicine after the expiration date. NOTE: This sheet is a summary. It may not cover all possible information. If you have questions about this medicine, talk to your doctor, pharmacist, or health care provider.  2020 Elsevier/Gold Standard (2012-12-01 17:57:36)   Cardiac CT Angiogram A cardiac CT angiogram is a procedure to look at the heart and the area around the heart. It may be done to help find the cause of chest pains or other symptoms of heart disease. During this procedure, a substance called contrast dye is  injected into the blood vessels  in the area to be checked. A large X-ray machine, called a CT scanner, then takes detailed pictures of the heart and the surrounding area. The procedure is also sometimes called a coronary CT angiogram, coronary artery scanning, or CTA. A cardiac CT angiogram allows the health care provider to see how well blood is flowing to and from the heart. The health care provider will be able to see if there are any problems, such as:  Blockage or narrowing of the coronary arteries in the heart.  Fluid around the heart.  Signs of weakness or disease in the muscles, valves, and tissues of the heart. Tell a health care provider about:  Any allergies you have. This is especially important if you have had a previous allergic reaction to contrast dye.  All medicines you are taking, including vitamins, herbs, eye drops, creams, and over-the-counter medicines.  Any blood disorders you have.  Any surgeries you have had.  Any medical conditions you have.  Whether you are pregnant or may be pregnant.  Any anxiety disorders, chronic pain, or other conditions you have that may increase your stress or prevent you from lying still. What are the risks? Generally, this is a safe procedure. However, problems may occur, including:  Bleeding.  Infection.  Allergic reactions to medicines or dyes.  Damage to other structures or organs.  Kidney damage from the contrast dye that is used.  Increased risk of cancer from radiation exposure. This risk is low. Talk with your health care provider about: ? The risks and benefits of testing. ? How you can receive the lowest dose of radiation. What happens before the procedure?  Wear comfortable clothing and remove any jewelry, glasses, dentures, and hearing aids.  Follow instructions from your health care provider about eating and drinking. This may include: ? For 12 hours before the procedure -- avoid caffeine. This includes tea,  coffee, soda, energy drinks, and diet pills. Drink plenty of water or other fluids that do not have caffeine in them. Being well hydrated can prevent complications. ? For 4-6 hours before the procedure -- stop eating and drinking. The contrast dye can cause nausea, but this is less likely if your stomach is empty.  Ask your health care provider about changing or stopping your regular medicines. This is especially important if you are taking diabetes medicines, blood thinners, or medicines to treat problems with erections (erectile dysfunction). What happens during the procedure?   Hair on your chest may need to be removed so that small sticky patches called electrodes can be placed on your chest. These will transmit information that helps to monitor your heart during the procedure.  An IV will be inserted into one of your veins.  You might be given a medicine to control your heart rate during the procedure. This will help to ensure that good images are obtained.  You will be asked to lie on an exam table. This table will slide in and out of the CT machine during the procedure.  Contrast dye will be injected into the IV. You might feel warm, or you may get a metallic taste in your mouth.  You will be given a medicine called nitroglycerin. This will relax or dilate the arteries in your heart.  The table that you are lying on will move into the CT machine tunnel for the scan.  The person running the machine will give you instructions while the scans are being done. You may be asked to: ? Keep your  arms above your head. ? Hold your breath. ? Stay very still, even if the table is moving.  When the scanning is complete, you will be moved out of the machine.  The IV will be removed. The procedure may vary among health care providers and hospitals. What can I expect after the procedure? After your procedure, it is common to have:  A metallic taste in your mouth from the contrast dye.  A  feeling of warmth.  A headache from the nitroglycerin. Follow these instructions at home:  Take over-the-counter and prescription medicines only as told by your health care provider.  If you are told, drink enough fluid to keep your urine pale yellow. This will help to flush the contrast dye out of your body.  Most people can return to their normal activities right after the procedure. Ask your health care provider what activities are safe for you.  It is up to you to get the results of your procedure. Ask your health care provider, or the department that is doing the procedure, when your results will be ready.  Keep all follow-up visits as told by your health care provider. This is important. Contact a health care provider if:  You have any symptoms of allergy to the contrast dye. These include: ? Shortness of breath. ? Rash or hives. ? A racing heartbeat. Summary  A cardiac CT angiogram is a procedure to look at the heart and the area around the heart. It may be done to help find the cause of chest pains or other symptoms of heart disease.  During this procedure, a large X-ray machine, called a CT scanner, takes detailed pictures of the heart and the surrounding area after a contrast dye has been injected into blood vessels in the area.  Ask your health care provider about changing or stopping your regular medicines before the procedure. This is especially important if you are taking diabetes medicines, blood thinners, or medicines to treat erectile dysfunction.  If you are told, drink enough fluid to keep your urine pale yellow. This will help to flush the contrast dye out of your body. This information is not intended to replace advice given to you by your health care provider. Make sure you discuss any questions you have with your health care provider. Document Revised: 09/28/2018 Document Reviewed: 09/28/2018 Elsevier Patient Education  [place].

## 2019-10-24 ENCOUNTER — Ambulatory Visit (INDEPENDENT_AMBULATORY_CARE_PROVIDER_SITE_OTHER): Payer: Medicare Other | Admitting: Podiatry

## 2019-10-24 ENCOUNTER — Encounter: Payer: Self-pay | Admitting: Podiatry

## 2019-10-24 ENCOUNTER — Other Ambulatory Visit: Payer: Self-pay

## 2019-10-24 DIAGNOSIS — T8189XA Other complications of procedures, not elsewhere classified, initial encounter: Secondary | ICD-10-CM

## 2019-10-24 DIAGNOSIS — R600 Localized edema: Secondary | ICD-10-CM

## 2019-10-24 MED ORDER — LIDOCAINE-PRILOCAINE 2.5-2.5 % EX CREA
1.0000 | TOPICAL_CREAM | CUTANEOUS | 2 refills | Status: DC | PRN
Start: 2019-10-24 — End: 2020-03-18

## 2019-10-25 NOTE — Progress Notes (Signed)
  Subjective:  Patient ID: [name], female    DOB: Sep 07, 1960,  MRN: [id number]  Chief Complaint  Patient presents with  . Routine Post Op    POV #4 DOS 09/08/2019 REMOVAL PAINFUL FIXATION HALLUX 1ST METATARSAL LT FOOT, FLEXOR TENOTOMY 2ND TOE LT,patient is concerned about swelling in surgery rt great toe, denies any pain today    59 y.o. female presents with the above complaint. History confirmed with patient.  Her chief concern is swelling about the surgical site that has been persistent.  There is a small lump that she feels.  Objective:  Physical Exam: warm, good capillary refill, no trophic changes or ulcerative lesions, normal DP and PT pulses and normal sensory exam. Left Foot: Incision is well-healed, there is a central small hard area that is warm.  No signs of infection or fluctuance. Assessment:   1. Suture reaction, initial encounter   2. Edema of left foot      Plan:  Patient was evaluated and treated and all questions answered.   -Discussed with her that her edema is likely postoperative in nature and she may be having a small suture reaction this will be self-limiting and should resolve.  I encouraged her to follow-up with Dr. Milinda Pointer to reevaluate this in 1 month.  Return in about 1 month (around 11/23/2019) for with Dr Milinda Pointer.

## 2019-10-27 ENCOUNTER — Telehealth: Payer: Self-pay | Admitting: *Deleted

## 2019-10-27 NOTE — Telephone Encounter (Signed)
Patient called and left message in triage voicemail regarding refill on Rx? I called patient back to discuss however her voicemail is full.

## 2019-10-30 ENCOUNTER — Other Ambulatory Visit: Payer: Self-pay | Admitting: Internal Medicine

## 2019-10-30 ENCOUNTER — Other Ambulatory Visit: Payer: Self-pay | Admitting: Podiatry

## 2019-10-30 NOTE — Telephone Encounter (Signed)
Please change to OTC Vitamin D3 at 2000 units per day, indefinitely.  

## 2019-10-31 ENCOUNTER — Other Ambulatory Visit: Payer: Self-pay | Admitting: Internal Medicine

## 2019-10-31 ENCOUNTER — Telehealth: Payer: Self-pay | Admitting: Internal Medicine

## 2019-10-31 NOTE — Telephone Encounter (Signed)
zolpidem (AMBIEN CR) 12.5 MG CR tablet  [place], [place] Phone:  [phone]  Fax:  [phone]      Insurance will not cover medication, needs something else prescribed, doesn't have new insurance

## 2019-10-31 NOTE — Telephone Encounter (Signed)
Please change to OTC Vitamin D3 at 2000 units per day, indefinitely.  

## 2019-11-02 MED ORDER — ZOLPIDEM TARTRATE ER 12.5 MG PO TBCR: 12.5000 mg | EXTENDED_RELEASE_TABLET | Freq: Every evening | ORAL | 1 refills | Status: DC | PRN

## 2019-11-02 NOTE — Telephone Encounter (Signed)
Done erx 

## 2019-11-30 ENCOUNTER — Encounter: Payer: Medicare Other | Admitting: Podiatry

## 2019-12-04 ENCOUNTER — Other Ambulatory Visit (HOSPITAL_COMMUNITY): Payer: Self-pay | Admitting: Emergency Medicine

## 2019-12-04 ENCOUNTER — Encounter (HOSPITAL_COMMUNITY): Payer: Self-pay

## 2019-12-04 DIAGNOSIS — E119 Type 2 diabetes mellitus without complications: Secondary | ICD-10-CM

## 2019-12-04 DIAGNOSIS — R072 Precordial pain: Secondary | ICD-10-CM

## 2019-12-06 ENCOUNTER — Telehealth: Payer: Self-pay

## 2019-12-06 NOTE — Telephone Encounter (Signed)
12/06/19  Pt had called with a complaint regarding the scheduling of the CTA that was ordered 10/16/19 and the length of time it took to schedule and the fact of the information given.  The test was scheduled within the 6-8 wk Time frame as stated on the AVS.  Pt was told that lab work would be needed 3-4 days  Prior to test date and it was a walk-in appt.  AVS was also printed and given to the patient with same instructions. When the test was scheduled, Rhys Martini repeated the instructions for labs as well and Marchia Bond sent a message thru [place].  Pt. Stated that we had "dropped the ball" in her care and cancelled the test that was scheduled on 12-07-19.  The patient was called on 12/06/19 to follow up on the complaint/concerns and asked if she would like to reschedule?   She stated that she would be finding another Cardiology.  In her initial complaint by Voice Mail on 12/05/19 she stated that if not called in a timely manner she would be speaking with news media.  This information was sent to the office of patient experience at [facility].

## 2019-12-07 ENCOUNTER — Ambulatory Visit (HOSPITAL_COMMUNITY): Payer: Medicare Other

## 2019-12-11 DIAGNOSIS — K5792 Diverticulitis of intestine, part unspecified, without perforation or abscess without bleeding: Secondary | ICD-10-CM | POA: Insufficient documentation

## 2019-12-11 DIAGNOSIS — F509 Eating disorder, unspecified: Secondary | ICD-10-CM | POA: Insufficient documentation

## 2019-12-11 DIAGNOSIS — I209 Angina pectoris, unspecified: Secondary | ICD-10-CM | POA: Insufficient documentation

## 2019-12-11 DIAGNOSIS — E119 Type 2 diabetes mellitus without complications: Secondary | ICD-10-CM | POA: Insufficient documentation

## 2019-12-11 DIAGNOSIS — F32A Depression, unspecified: Secondary | ICD-10-CM | POA: Insufficient documentation

## 2019-12-11 DIAGNOSIS — Z9189 Other specified personal risk factors, not elsewhere classified: Secondary | ICD-10-CM | POA: Insufficient documentation

## 2019-12-11 DIAGNOSIS — A388 Scarlet fever with other complications: Secondary | ICD-10-CM | POA: Insufficient documentation

## 2019-12-11 DIAGNOSIS — Z9889 Other specified postprocedural states: Secondary | ICD-10-CM | POA: Insufficient documentation

## 2019-12-11 DIAGNOSIS — K759 Inflammatory liver disease, unspecified: Secondary | ICD-10-CM | POA: Insufficient documentation

## 2019-12-11 DIAGNOSIS — K219 Gastro-esophageal reflux disease without esophagitis: Secondary | ICD-10-CM | POA: Insufficient documentation

## 2019-12-11 DIAGNOSIS — R519 Headache, unspecified: Secondary | ICD-10-CM | POA: Insufficient documentation

## 2019-12-11 DIAGNOSIS — I498 Other specified cardiac arrhythmias: Secondary | ICD-10-CM | POA: Insufficient documentation

## 2019-12-11 DIAGNOSIS — R112 Nausea with vomiting, unspecified: Secondary | ICD-10-CM | POA: Insufficient documentation

## 2019-12-11 DIAGNOSIS — T7491XA Unspecified adult maltreatment, confirmed, initial encounter: Secondary | ICD-10-CM | POA: Insufficient documentation

## 2019-12-12 ENCOUNTER — Ambulatory Visit: Payer: Medicaid Other | Admitting: Cardiology

## 2019-12-27 ENCOUNTER — Telehealth: Payer: Self-pay | Admitting: Internal Medicine

## 2019-12-27 DIAGNOSIS — E119 Type 2 diabetes mellitus without complications: Secondary | ICD-10-CM

## 2019-12-27 DIAGNOSIS — G629 Polyneuropathy, unspecified: Secondary | ICD-10-CM

## 2019-12-27 NOTE — Telephone Encounter (Signed)
gabapentin (NEURONTIN) 300 MG capsule LORazepam (ATIVAN) 0.5 MG tablet [place], [place] Phone:  [phone]  Fax:  [phone]     Requesting a refill

## 2019-12-31 MED ORDER — GABAPENTIN 300 MG PO CAPS: 300.0000 mg | ORAL_CAPSULE | Freq: Every day | ORAL | 1 refills | Status: DC | PRN

## 2019-12-31 NOTE — Telephone Encounter (Signed)
Done erx 

## 2020-01-03 ENCOUNTER — Other Ambulatory Visit: Payer: Self-pay

## 2020-01-04 ENCOUNTER — Encounter: Payer: Self-pay | Admitting: Internal Medicine

## 2020-01-04 ENCOUNTER — Ambulatory Visit (INDEPENDENT_AMBULATORY_CARE_PROVIDER_SITE_OTHER): Payer: Medicare Other | Admitting: Internal Medicine

## 2020-01-04 VITALS — BP 140/80 | HR 61 | Temp 98.7°F | Ht 65.0 in | Wt 125.0 lb

## 2020-01-04 DIAGNOSIS — Z0000 Encounter for general adult medical examination without abnormal findings: Secondary | ICD-10-CM | POA: Diagnosis not present

## 2020-01-04 DIAGNOSIS — F5101 Primary insomnia: Secondary | ICD-10-CM

## 2020-01-04 DIAGNOSIS — Z23 Encounter for immunization: Secondary | ICD-10-CM

## 2020-01-04 DIAGNOSIS — E1165 Type 2 diabetes mellitus with hyperglycemia: Secondary | ICD-10-CM

## 2020-01-04 DIAGNOSIS — E559 Vitamin D deficiency, unspecified: Secondary | ICD-10-CM

## 2020-01-04 DIAGNOSIS — E538 Deficiency of other specified B group vitamins: Secondary | ICD-10-CM | POA: Diagnosis not present

## 2020-01-04 DIAGNOSIS — I700 Atherosclerosis of aorta: Secondary | ICD-10-CM

## 2020-01-04 DIAGNOSIS — I209 Angina pectoris, unspecified: Secondary | ICD-10-CM | POA: Diagnosis not present

## 2020-01-04 DIAGNOSIS — E781 Pure hyperglyceridemia: Secondary | ICD-10-CM

## 2020-01-04 DIAGNOSIS — F419 Anxiety disorder, unspecified: Secondary | ICD-10-CM

## 2020-01-04 DIAGNOSIS — R079 Chest pain, unspecified: Secondary | ICD-10-CM

## 2020-01-04 DIAGNOSIS — I10 Essential (primary) hypertension: Secondary | ICD-10-CM

## 2020-01-04 DIAGNOSIS — Z0001 Encounter for general adult medical examination with abnormal findings: Secondary | ICD-10-CM

## 2020-01-04 LAB — HEPATIC FUNCTION PANEL
ALT: 11 U/L (ref 0–35)
AST: 19 U/L (ref 0–37)
Albumin: 4.3 g/dL (ref 3.5–5.2)
Alkaline Phosphatase: 81 U/L (ref 39–117)
Bilirubin, Direct: 0.1 mg/dL (ref 0.0–0.3)
Total Bilirubin: 0.5 mg/dL (ref 0.2–1.2)
Total Protein: 7.5 g/dL (ref 6.0–8.3)

## 2020-01-04 LAB — URINALYSIS, ROUTINE W REFLEX MICROSCOPIC
Bilirubin Urine: NEGATIVE
Ketones, ur: NEGATIVE
Leukocytes,Ua: NEGATIVE
Nitrite: POSITIVE — AB
Specific Gravity, Urine: >=1.03 — AB (ref 1.000–1.030)
Urine Glucose: NEGATIVE
Urobilinogen, UA: 0.2 (ref 0.0–1.0)
pH: 6 (ref 5.0–8.0)

## 2020-01-04 LAB — CBC WITH DIFFERENTIAL/PLATELET
Basophils Absolute: 0 10*3/uL (ref 0.0–0.1)
Basophils Relative: 0.8 % (ref 0.0–3.0)
Eosinophils Absolute: 0.4 10*3/uL (ref 0.0–0.7)
Eosinophils Relative: 8.2 % — ABNORMAL HIGH (ref 0.0–5.0)
HCT: 36.6 % (ref 36.0–46.0)
Hemoglobin: 12 g/dL (ref 12.0–15.0)
Lymphocytes Relative: 33.8 % (ref 12.0–46.0)
Lymphs Abs: 1.6 10*3/uL (ref 0.7–4.0)
MCHC: 32.7 g/dL (ref 30.0–36.0)
MCV: 93.6 fl (ref 78.0–100.0)
Monocytes Absolute: 0.5 10*3/uL (ref 0.1–1.0)
Monocytes Relative: 11.3 % (ref 3.0–12.0)
Neutro Abs: 2.2 10*3/uL (ref 1.4–7.7)
Neutrophils Relative %: 45.9 % (ref 43.0–77.0)
Platelets: 217 10*3/uL (ref 150.0–400.0)
RBC: 3.91 Mil/uL (ref 3.87–5.11)
RDW: 14.3 % (ref 11.5–15.5)
WBC: 4.7 10*3/uL (ref 4.0–10.5)

## 2020-01-04 LAB — BASIC METABOLIC PANEL
BUN: 12 mg/dL (ref 6–23)
CO2: 29 mEq/L (ref 19–32)
Calcium: 9.9 mg/dL (ref 8.4–10.5)
Chloride: 105 mEq/L (ref 96–112)
Creatinine, Ser: 1.01 mg/dL (ref 0.40–1.20)
GFR: 62.05 mL/min (ref >60.00)
Glucose, Bld: 100 mg/dL — ABNORMAL HIGH (ref 70–99)
Potassium: 3.6 mEq/L (ref 3.5–5.1)
Sodium: 141 mEq/L (ref 135–145)

## 2020-01-04 LAB — HEMOGLOBIN A1C: Hgb A1c MFr Bld: 6.3 % (ref 4.6–6.5)

## 2020-01-04 LAB — LIPID PANEL
Cholesterol: 190 mg/dL (ref 0–200)
HDL: 62.7 mg/dL (ref >39.00)
LDL Cholesterol: 109 mg/dL — ABNORMAL HIGH (ref 0–99)
NonHDL: 126.98
Total CHOL/HDL Ratio: 3
Triglycerides: 92 mg/dL (ref 0.0–149.0)
VLDL: 18.4 mg/dL (ref 0.0–40.0)

## 2020-01-04 LAB — VITAMIN B12: Vitamin B-12: 522 pg/mL (ref 211–911)

## 2020-01-04 LAB — MICROALBUMIN / CREATININE URINE RATIO
Creatinine,U: 239.2 mg/dL
Microalb Creat Ratio: 3.5 mg/g (ref 0.0–30.0)
Microalb, Ur: 8.3 mg/dL — ABNORMAL HIGH (ref 0.0–1.9)

## 2020-01-04 LAB — VITAMIN D 25 HYDROXY (VIT D DEFICIENCY, FRACTURES): VITD: 27.97 ng/mL — ABNORMAL LOW (ref 30.00–100.00)

## 2020-01-04 LAB — TSH: TSH: 0.77 u[IU]/mL (ref 0.35–4.50)

## 2020-01-04 MED ORDER — ONDANSETRON HCL 4 MG PO TABS
4.0000 mg | ORAL_TABLET | Freq: Three times a day (TID) | ORAL | 1 refills | Status: DC | PRN
Start: 2020-01-04 — End: 2020-03-15

## 2020-01-04 MED ORDER — VITAMIN D (ERGOCALCIFEROL) 1.25 MG (50000 UNIT) PO CAPS
50000.0000 [IU] | ORAL_CAPSULE | ORAL | 0 refills | Status: DC
Start: 2020-01-04 — End: 2020-01-07

## 2020-01-04 MED ORDER — DIAZEPAM 5 MG PO TABS: 5.0000 mg | ORAL_TABLET | Freq: Every day | ORAL | 2 refills | Status: DC | PRN
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
## 2020-01-04 NOTE — Patient Instructions (Addendum)
Please take Vitamin D 50000 units weekly for 12 weeks, then plan to change to OTC Vitamin D3 at 2000 units per day, indefinitely.  You had the flu shot today  Ok to change the ativan and ambien to the valium  Please continue all other medications as before, and refills have been done if requested - the zofran  Please have the pharmacy call with any other refills you may need.  Please continue your efforts at being more active, low cholesterol diet, and weight control.  You are otherwise up to date with prevention measures today.  Please keep your appointments with your specialists as you may have planned  You will be contacted regarding the referral for: cardiology  Please go to the LAB at the blood drawing area for the tests to be done, including the VItamin D test  You will be contacted by phone if any changes need to be made immediately.  Otherwise, you will receive a letter about your results with an explanation, but please check with MyChart first.  Please remember to sign up for MyChart if you have not done so, as this will be important to you in the future with finding out test results, communicating by private email, and scheduling acute appointments online when needed.  Please make an Appointment to return in 6 months, or sooner if needed

## 2020-01-05 ENCOUNTER — Telehealth: Payer: Self-pay | Admitting: Internal Medicine

## 2020-01-05 ENCOUNTER — Other Ambulatory Visit: Payer: Self-pay

## 2020-01-05 DIAGNOSIS — I10 Essential (primary) hypertension: Secondary | ICD-10-CM

## 2020-01-05 DIAGNOSIS — F32A Depression, unspecified: Secondary | ICD-10-CM

## 2020-01-05 DIAGNOSIS — K219 Gastro-esophageal reflux disease without esophagitis: Secondary | ICD-10-CM

## 2020-01-05 MED ORDER — AMLODIPINE BESYLATE 10 MG PO TABS: 10.0000 mg | ORAL_TABLET | Freq: Every day | ORAL | 3 refills | Status: DC

## 2020-01-05 MED ORDER — BUPROPION HCL 75 MG PO TABS: 75.0000 mg | ORAL_TABLET | Freq: Two times a day (BID) | ORAL | 3 refills | Status: DC

## 2020-01-05 MED ORDER — ALBUTEROL SULFATE HFA 108 (90 BASE) MCG/ACT IN AERS: 2.0000 | INHALATION_SPRAY | Freq: Four times a day (QID) | RESPIRATORY_TRACT | 2 refills | Status: DC | PRN

## 2020-01-05 MED ORDER — ESOMEPRAZOLE MAGNESIUM 20 MG PO CPDR: 20.0000 mg | DELAYED_RELEASE_CAPSULE | Freq: Every day | ORAL | 3 refills | Status: DC

## 2020-01-05 MED ORDER — ATORVASTATIN CALCIUM 20 MG PO TABS
20.0000 mg | ORAL_TABLET | Freq: Every day | ORAL | 3 refills | Status: DC
Start: 2020-01-05 — End: 2020-03-15

## 2020-01-05 MED FILL — METOPROLOL TARTRATE 25 MG T: 25 | 90 days supply | Qty: 180 | Fill #1

## 2020-01-05 NOTE — Telephone Encounter (Signed)
albuterol (PROVENTIL HFA;VENTOLIN HFA) 108 (90 Base) MCG/ACT inhaler [place] [address] [place] Phone:  [phone]  Fax:  [phone]     Requesting a refill

## 2020-01-06 ENCOUNTER — Encounter: Payer: Self-pay | Admitting: Internal Medicine

## 2020-01-06 NOTE — Assessment & Plan Note (Addendum)
For cardiology referral per pt reqeust  I spent 31 minutes in addition to time for CPX wellness examination in preparing to see the patient by review of recent labs, imaging and procedures, obtaining and reviewing separately obtained history, communicating with the patient and family or caregiver, ordering medications, tests or procedures, and documenting clinical information in the EHR including the differential Dx, treatment, and any further evaluation and other management of angina, aortic aotherosclerosis, hld, vit d def, dm, htn, anxiety, insomnia

## 2020-01-06 NOTE — Assessment & Plan Note (Signed)
To cont lipitor,low chol diet

## 2020-01-06 NOTE — Assessment & Plan Note (Signed)
stable overall by history and exam, recent data reviewed with pt, and pt to continue medical treatment as before,  to f/u any worsening symptoms or concerns  

## 2020-01-06 NOTE — Assessment & Plan Note (Signed)
[place] for change xanax/ambien to valium 5 qd prn,  to f/u any worsening symptoms or concerns

## 2020-01-06 NOTE — Assessment & Plan Note (Signed)
Ok d/c [place] per pt request

## 2020-01-06 NOTE — Assessment & Plan Note (Signed)

## 2020-01-06 NOTE — Assessment & Plan Note (Signed)
Cont oral vit d 2000 u qd

## 2020-01-07 ENCOUNTER — Other Ambulatory Visit: Payer: Self-pay | Admitting: Internal Medicine

## 2020-01-07 ENCOUNTER — Encounter: Payer: Self-pay | Admitting: Internal Medicine

## 2020-01-07 MED ORDER — VITAMIN D (ERGOCALCIFEROL) 1.25 MG (50000 UNIT) PO CAPS
50000.0000 [IU] | ORAL_CAPSULE | ORAL | 0 refills | Status: DC
Start: 2020-01-07 — End: 2021-01-14

## 2020-01-09 NOTE — Telephone Encounter (Signed)
° ° °  Please return call to patient to discuss lab results °

## 2020-01-18 NOTE — Telephone Encounter (Signed)
Tried to call pt to inform her that Dr. Jenny Reichmann has stated her Labs are good and she only showed a low Vitamin D level which Dr. Jenny Reichmann has sent over a rx for her of the Vit D to take 1x weekly. Pts phone had no room in her VM to LVM at this time.

## 2020-01-19 ENCOUNTER — Ambulatory Visit: Payer: Medicare Other

## 2020-01-24 ENCOUNTER — Ambulatory Visit: Payer: Medicare Other | Admitting: Cardiology

## 2020-02-01 ENCOUNTER — Ambulatory Visit: Payer: Medicare Other

## 2020-02-05 DIAGNOSIS — G894 Chronic pain syndrome: Secondary | ICD-10-CM | POA: Diagnosis not present

## 2020-02-05 DIAGNOSIS — M62830 Muscle spasm of back: Secondary | ICD-10-CM | POA: Diagnosis not present

## 2020-02-05 DIAGNOSIS — M5416 Radiculopathy, lumbar region: Secondary | ICD-10-CM | POA: Diagnosis not present

## 2020-02-15 ENCOUNTER — Ambulatory Visit: Payer: Medicare Other | Admitting: Cardiology

## 2020-02-15 ENCOUNTER — Encounter: Payer: Self-pay | Admitting: Cardiology

## 2020-02-15 ENCOUNTER — Other Ambulatory Visit: Payer: Self-pay

## 2020-02-15 VITALS — BP 140/82 | HR 63 | Ht 65.0 in | Wt 130.0 lb

## 2020-02-15 DIAGNOSIS — I10 Essential (primary) hypertension: Secondary | ICD-10-CM

## 2020-02-15 DIAGNOSIS — J449 Chronic obstructive pulmonary disease, unspecified: Secondary | ICD-10-CM | POA: Diagnosis not present

## 2020-02-15 DIAGNOSIS — R072 Precordial pain: Secondary | ICD-10-CM | POA: Diagnosis not present

## 2020-02-15 DIAGNOSIS — E119 Type 2 diabetes mellitus without complications: Secondary | ICD-10-CM | POA: Diagnosis not present

## 2020-02-15 DIAGNOSIS — F17210 Nicotine dependence, cigarettes, uncomplicated: Secondary | ICD-10-CM | POA: Diagnosis not present

## 2020-02-15 DIAGNOSIS — F17200 Nicotine dependence, unspecified, uncomplicated: Secondary | ICD-10-CM
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
## 2020-02-28 DIAGNOSIS — R072 Precordial pain: Secondary | ICD-10-CM | POA: Diagnosis not present

## 2020-02-29 LAB — BASIC METABOLIC PANEL
BUN/Creatinine Ratio: 17 (ref 9–23)
BUN: 18 mg/dL (ref 6–24)
CO2: 22 mmol/L (ref 20–29)
Calcium: 10 mg/dL (ref 8.7–10.2)
Chloride: 100 mmol/L (ref 96–106)
Creatinine, Ser: 1.05 mg/dL — ABNORMAL HIGH (ref 0.57–1.00)
GFR calc Af Amer: 68 mL/min/{1.73_m2} (ref >59)
GFR calc non Af Amer: 59 mL/min/{1.73_m2} — ABNORMAL LOW (ref >59)
Glucose: 102 mg/dL — ABNORMAL HIGH (ref 65–99)
Potassium: 3.9 mmol/L (ref 3.5–5.2)
Sodium: 135 mmol/L (ref 134–144)

## 2020-03-01 ENCOUNTER — Telehealth (HOSPITAL_COMMUNITY): Payer: Self-pay | Admitting: Emergency Medicine

## 2020-03-01 NOTE — Telephone Encounter (Signed)
Reaching out to patient to offer assistance regarding upcoming cardiac imaging study; pt verbalizes understanding of appt date/time, parking situation and where to check in, pre-test NPO status and medications ordered, and verified current allergies; name and call back number provided for further questions should they arise Zamara Cozad RN Navigator Cardiac Imaging [facility] [phone] office [phone] cell 

## 2020-03-05 ENCOUNTER — Ambulatory Visit (HOSPITAL_COMMUNITY): Admission: RE | Admit: 2020-03-05 | Payer: Medicare Other | Source: Ambulatory Visit

## 2020-03-08 ENCOUNTER — Telehealth (HOSPITAL_COMMUNITY): Payer: Self-pay | Admitting: *Deleted

## 2020-03-08 NOTE — Telephone Encounter (Signed)
Opened in error

## 2020-03-08 NOTE — Telephone Encounter (Signed)
Reaching out to patient to offer assistance regarding upcoming cardiac imaging study; pt states she still understand the instructions from previous calls.  Pt has call back number should any questions arise.  Gordy Clement RN Navigator Cardiac Imaging [facility] [phone] office [phone] cell

## 2020-03-11 ENCOUNTER — Ambulatory Visit (HOSPITAL_COMMUNITY)
Admission: RE | Admit: 2020-03-11 | Discharge: 2020-03-11 | Disposition: A | Discharge status: Home / Self Care | Payer: Medicare Other | Source: Ambulatory Visit | Attending: Cardiology | Admitting: Cardiology

## 2020-03-11 ENCOUNTER — Other Ambulatory Visit: Payer: Self-pay

## 2020-03-11 ENCOUNTER — Encounter (HOSPITAL_COMMUNITY): Payer: Self-pay

## 2020-03-11 DIAGNOSIS — I209 Angina pectoris, unspecified: Secondary | ICD-10-CM | POA: Diagnosis not present

## 2020-03-11 DIAGNOSIS — R072 Precordial pain: Secondary | ICD-10-CM | POA: Insufficient documentation

## 2020-03-11 DIAGNOSIS — I2510 Atherosclerotic heart disease of native coronary artery without angina pectoris: Secondary | ICD-10-CM | POA: Diagnosis not present

## 2020-03-11 MED ORDER — IOHEXOL 350 MG/ML SOLN
80.0000 mL | Freq: Once | INTRAVENOUS | Status: AC | PRN
  Administered 2020-03-11: 80 mL via INTRAVENOUS

## 2020-03-11 MED ORDER — NITROGLYCERIN 0.4 MG SL SUBL
SUBLINGUAL_TABLET | SUBLINGUAL | Status: AC
  Administered 2020-03-11: 0.8 mg via SUBLINGUAL
  Filled 2020-03-11: qty 2

## 2020-03-11 MED ORDER — METOPROLOL TARTRATE 5 MG/5ML IV SOLN
INTRAVENOUS | Status: AC
  Filled 2020-03-11: qty 20

## 2020-03-11 MED ORDER — METOPROLOL TARTRATE 5 MG/5ML IV SOLN
5.0000 mg | INTRAVENOUS | Status: DC | PRN
  Administered 2020-03-11: 5 mg via INTRAVENOUS

## 2020-03-11 MED ORDER — NITROGLYCERIN 0.4 MG SL SUBL: 0.8000 mg | SUBLINGUAL_TABLET | Freq: Once | SUBLINGUAL | Status: AC

## 2020-03-13 ENCOUNTER — Ambulatory Visit (HOSPITAL_COMMUNITY)
Admission: RE | Admit: 2020-03-13 | Discharge: 2020-03-13 | Disposition: A | Discharge status: Home / Self Care | Payer: Medicare Other | Source: Ambulatory Visit | Attending: Cardiology | Admitting: Cardiology

## 2020-03-13 DIAGNOSIS — E785 Hyperlipidemia, unspecified: Secondary | ICD-10-CM | POA: Insufficient documentation

## 2020-03-13 DIAGNOSIS — E119 Type 2 diabetes mellitus without complications: Secondary | ICD-10-CM | POA: Diagnosis not present

## 2020-03-13 DIAGNOSIS — R072 Precordial pain: Secondary | ICD-10-CM | POA: Diagnosis not present

## 2020-03-13 DIAGNOSIS — J449 Chronic obstructive pulmonary disease, unspecified: Secondary | ICD-10-CM | POA: Diagnosis not present

## 2020-03-13 DIAGNOSIS — I10 Essential (primary) hypertension: Secondary | ICD-10-CM | POA: Insufficient documentation

## 2020-03-15 ENCOUNTER — Other Ambulatory Visit: Payer: Self-pay

## 2020-03-15 ENCOUNTER — Ambulatory Visit: Payer: Medicare Other | Admitting: Cardiology

## 2020-03-15 ENCOUNTER — Encounter: Payer: Self-pay | Admitting: Cardiology

## 2020-03-15 VITALS — BP 121/74 | HR 73 | Temp 98.0°F | Resp 16 | Ht 65.0 in | Wt 131.4 lb

## 2020-03-15 DIAGNOSIS — Z712 Person consulting for explanation of examination or test findings: Secondary | ICD-10-CM

## 2020-03-15 DIAGNOSIS — E119 Type 2 diabetes mellitus without complications: Secondary | ICD-10-CM | POA: Diagnosis not present

## 2020-03-15 DIAGNOSIS — F17200 Nicotine dependence, unspecified, uncomplicated: Secondary | ICD-10-CM | POA: Diagnosis not present

## 2020-03-15 DIAGNOSIS — E782 Mixed hyperlipidemia: Secondary | ICD-10-CM

## 2020-03-15 DIAGNOSIS — I2510 Atherosclerotic heart disease of native coronary artery without angina pectoris: Secondary | ICD-10-CM

## 2020-03-15 DIAGNOSIS — I2584 Coronary atherosclerosis due to calcified coronary lesion: Secondary | ICD-10-CM | POA: Diagnosis not present

## 2020-03-15 DIAGNOSIS — I10 Essential (primary) hypertension: Secondary | ICD-10-CM | POA: Diagnosis not present

## 2020-03-15 DIAGNOSIS — I209 Angina pectoris, unspecified: Secondary | ICD-10-CM | POA: Diagnosis not present

## 2020-03-15 DIAGNOSIS — IMO0001 Reserved for inherently not codable concepts without codable children: Secondary | ICD-10-CM

## 2020-03-15 MED ORDER — ASPIRIN EC 81 MG PO TBEC: 81.0000 mg | DELAYED_RELEASE_TABLET | Freq: Every day | ORAL | 3 refills | Status: AC

## 2020-03-15 MED ORDER — ATORVASTATIN CALCIUM 40 MG PO TABS: 40.0000 mg | ORAL_TABLET | Freq: Every day | ORAL | 0 refills | Status: DC

## 2020-03-15 MED ORDER — NITROGLYCERIN 0.4 MG SL SUBL: 0.4000 mg | SUBLINGUAL_TABLET | SUBLINGUAL | 1 refills | Status: DC | PRN

## 2020-03-15 MED ORDER — METOPROLOL SUCCINATE ER 50 MG PO TB24: 50.0000 mg | ORAL_TABLET | Freq: Every morning | ORAL | 0 refills | Status: DC
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
## 2020-03-18 ENCOUNTER — Encounter: Payer: Self-pay | Admitting: Cardiology

## 2020-04-02 ENCOUNTER — Other Ambulatory Visit: Payer: Self-pay | Admitting: Internal Medicine

## 2020-04-02 ENCOUNTER — Encounter: Payer: Self-pay | Admitting: Cardiology

## 2020-04-02 ENCOUNTER — Ambulatory Visit: Payer: Medicare Other | Admitting: Cardiology

## 2020-04-02 ENCOUNTER — Telehealth: Payer: Self-pay | Admitting: Internal Medicine

## 2020-04-02 ENCOUNTER — Other Ambulatory Visit: Payer: Self-pay

## 2020-04-02 VITALS — BP 148/81 | HR 99 | Temp 98.3°F | Resp 16 | Ht 65.0 in | Wt 130.8 lb

## 2020-04-02 DIAGNOSIS — F17210 Nicotine dependence, cigarettes, uncomplicated: Secondary | ICD-10-CM | POA: Diagnosis not present

## 2020-04-02 DIAGNOSIS — I209 Angina pectoris, unspecified: Secondary | ICD-10-CM | POA: Diagnosis not present

## 2020-04-02 DIAGNOSIS — F17200 Nicotine dependence, unspecified, uncomplicated: Secondary | ICD-10-CM

## 2020-04-02 DIAGNOSIS — I10 Essential (primary) hypertension: Secondary | ICD-10-CM | POA: Diagnosis not present

## 2020-04-02 DIAGNOSIS — I2584 Coronary atherosclerosis due to calcified coronary lesion: Secondary | ICD-10-CM | POA: Diagnosis not present

## 2020-04-02 DIAGNOSIS — J449 Chronic obstructive pulmonary disease, unspecified: Secondary | ICD-10-CM

## 2020-04-02 DIAGNOSIS — I2510 Atherosclerotic heart disease of native coronary artery without angina pectoris: Secondary | ICD-10-CM

## 2020-04-02 DIAGNOSIS — E782 Mixed hyperlipidemia: Secondary | ICD-10-CM

## 2020-04-02 DIAGNOSIS — E119 Type 2 diabetes mellitus without complications: Secondary | ICD-10-CM

## 2020-04-02 MED ORDER — METOPROLOL SUCCINATE ER 100 MG PO TB24: 100.0000 mg | ORAL_TABLET | Freq: Every day | ORAL | 0 refills | Status: DC
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
## 2020-04-02 NOTE — Telephone Encounter (Signed)
Patients cardiologist wanted her to see a pulmonologist about her copd so he can eventually do a heart stint on her. She previously has seen a pulmonologist but cant remember who and was wondering if she needed to come in or if we could do a referral for her.    diazepam (VALIUM) 5 MG tablet Patient also requesting a refill

## 2020-04-03 ENCOUNTER — Other Ambulatory Visit: Payer: Self-pay

## 2020-04-03 DIAGNOSIS — I209 Angina pectoris, unspecified: Secondary | ICD-10-CM

## 2020-04-03 NOTE — Telephone Encounter (Signed)
See below

## 2020-04-03 NOTE — Telephone Encounter (Signed)
By the chart, you have seen Dr Elsworth Soho in June 2019  I will refer back  [facility] for refill - done erx already today

## 2020-04-04 LAB — CMP14+EGFR
ALT: 13 IU/L (ref 0–32)
AST: 24 IU/L (ref 0–40)
Albumin/Globulin Ratio: 1.7 (ref 1.2–2.2)
Albumin: 4.7 g/dL (ref 3.8–4.9)
Alkaline Phosphatase: 105 IU/L (ref 44–121)
BUN/Creatinine Ratio: 18 (ref 9–23)
BUN: 19 mg/dL (ref 6–24)
Bilirubin Total: 0.5 mg/dL (ref 0.0–1.2)
CO2: 18 mmol/L — ABNORMAL LOW (ref 20–29)
Calcium: 9.8 mg/dL (ref 8.7–10.2)
Chloride: 103 mmol/L (ref 96–106)
Creatinine, Ser: 1.03 mg/dL — ABNORMAL HIGH (ref 0.57–1.00)
GFR calc Af Amer: 70 mL/min/{1.73_m2} (ref >59)
GFR calc non Af Amer: 60 mL/min/{1.73_m2} (ref >59)
Globulin, Total: 2.8 g/dL (ref 1.5–4.5)
Glucose: 92 mg/dL (ref 65–99)
Potassium: 4.6 mmol/L (ref 3.5–5.2)
Sodium: 140 mmol/L (ref 134–144)
Total Protein: 7.5 g/dL (ref 6.0–8.5)

## 2020-04-04 LAB — CBC
Hematocrit: 36.4 % (ref 34.0–46.6)
Hemoglobin: 12.2 g/dL (ref 11.1–15.9)
MCH: 30.7 pg (ref 26.6–33.0)
MCHC: 33.5 g/dL (ref 31.5–35.7)
MCV: 92 fL (ref 79–97)
Platelets: 296 10*3/uL (ref 150–450)
RBC: 3.97 x10E6/uL (ref 3.77–5.28)
RDW: 14.7 % (ref 11.7–15.4)
WBC: 5.6 10*3/uL (ref 3.4–10.8)

## 2020-04-04 LAB — LIPID PANEL W/O CHOL/HDL RATIO
Cholesterol, Total: 192 mg/dL (ref 100–199)
HDL: 72 mg/dL (ref >39)
LDL Chol Calc (NIH): 103 mg/dL — ABNORMAL HIGH (ref 0–99)
Triglycerides: 97 mg/dL (ref 0–149)
VLDL Cholesterol Cal: 17 mg/dL (ref 5–40)

## 2020-04-04 LAB — LDL CHOLESTEROL, DIRECT: LDL Direct: 110 mg/dL — ABNORMAL HIGH (ref 0–99)

## 2020-04-04 NOTE — Telephone Encounter (Signed)
Spoke with patient today and info given. 

## 2020-04-05 ENCOUNTER — Ambulatory Visit (INDEPENDENT_AMBULATORY_CARE_PROVIDER_SITE_OTHER): Payer: Medicare Other

## 2020-04-05 ENCOUNTER — Other Ambulatory Visit: Payer: Self-pay

## 2020-04-05 VITALS — BP 130/80 | HR 70 | Temp 98.2°F | Ht 65.0 in | Wt 129.6 lb

## 2020-04-05 DIAGNOSIS — Z0000 Encounter for general adult medical examination without abnormal findings: Secondary | ICD-10-CM | POA: Diagnosis not present
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
## 2020-04-05 NOTE — Patient Instructions (Signed)
[name] , Thank you for taking time to come for your Medicare Wellness Visit. I appreciate your ongoing commitment to your health goals. Please review the following plan we discussed and let me know if I can assist you in the future.   Screening recommendations/referrals: Colonoscopy: 09/14/2017; due every 3 years Mammogram: 01/09/2019 Bone Density: never done Recommended yearly ophthalmology/optometry visit for glaucoma screening and checkup Recommended yearly dental visit for hygiene and checkup  Vaccinations: Influenza vaccine: 01/04/2020 Pneumococcal vaccine: 08/15/2013, 01/30/2017 Tdap vaccine: 04/09/2016; due very 10 years Shingles vaccine: never done  Covid-19: declined  Advanced directives: Advance directive discussed with you today. Even though you declined this today please call our office should you change your mind and we can give you the proper paperwork for you to fill out.  Conditions/risks identified: Yes; Reviewed health maintenance screenings with patient today and relevant education, vaccines, and/or referrals were provided. Please continue to do your personal lifestyle choices by: daily care of teeth and gums, regular physical activity (goal should be 5 days a week for 30 minutes), eat a healthy diet, avoid tobacco and drug use, limiting any alcohol intake, taking a low-dose aspirin (if not allergic or have been advised by your provider otherwise) and taking vitamins and minerals as recommended by your provider. Continue doing brain stimulating activities (puzzles, reading, adult coloring books, staying active) to keep memory sharp. Continue to eat heart healthy diet (full of fruits, vegetables, whole grains, lean protein, water--limit salt, fat, and sugar intake) and increase physical activity as tolerated.  Next appointment: Please schedule your next Medicare Wellness Visit with your Nurse Health Advisor in 1 year by calling [phone].  Preventive Care 40-64 Years,  Female Preventive care refers to lifestyle choices and visits with your health care provider that can promote health and wellness. What does preventive care include?  A yearly physical exam. This is also called an annual well check.  Dental exams once or twice a year.  Routine eye exams. Ask your health care provider how often you should have your eyes checked.  Personal lifestyle choices, including:  Daily care of your teeth and gums.  Regular physical activity.  Eating a healthy diet.  Avoiding tobacco and drug use.  Limiting alcohol use.  Practicing safe sex.  Taking low-dose aspirin daily starting at age 30.  Taking vitamin and mineral supplements as recommended by your health care provider. What happens during an annual well check? The services and screenings done by your health care provider during your annual well check will depend on your age, overall health, lifestyle risk factors, and family history of disease. Counseling  Your health care provider may ask you questions about your:  Alcohol use.  Tobacco use.  Drug use.  Emotional well-being.  Home and relationship well-being.  Sexual activity.  Eating habits.  Work and work Astron[employer].  Method of birth control.  Menstrual cycle.  Pregnancy history. Screening  You may have the following tests or measurements:  Height, weight, and BMI.  Blood pressure.  Lipid and cholesterol levels. These may be checked every 5 years, or more frequently if you are over 59 years old.  Skin check.  Lung cancer screening. You may have this screening every year starting at age 43 if you have a 30-pack-year history of smoking and currently smoke or have quit within the past 15 years.  Fecal occult blood test (FOBT) of the stool. You may have this test every year starting at age 81.  Flexible sigmoidoscopy or  colonoscopy. You may have a sigmoidoscopy every 5 years or a colonoscopy every 10 years starting at age  12.  Hepatitis C blood test.  Hepatitis B blood test.  Sexually transmitted disease (STD) testing.  Diabetes screening. This is done by checking your blood sugar (glucose) after you have not eaten for a while (fasting). You may have this done every 1-3 years.  Mammogram. This may be done every 1-2 years. Talk to your health care provider about when you should start having regular mammograms. This may depend on whether you have a family history of breast cancer.  BRCA-related cancer screening. This may be done if you have a family history of breast, ovarian, tubal, or peritoneal cancers.  Pelvic exam and Pap test. This may be done every 3 years starting at age 34. Starting at age 21, this may be done every 5 years if you have a Pap test in combination with an HPV test.  Bone density scan. This is done to screen for osteoporosis. You may have this scan if you are at high risk for osteoporosis. Discuss your test results, treatment options, and if necessary, the need for more tests with your health care provider. Vaccines  Your health care provider may recommend certain vaccines, such as:  Influenza vaccine. This is recommended every year.  Tetanus, diphtheria, and acellular pertussis (Tdap, Td) vaccine. You may need a Td booster every 10 years.  Zoster vaccine. You may need this after age 75.  Pneumococcal 13-valent conjugate (PCV13) vaccine. You may need this if you have certain conditions and were not previously vaccinated.  Pneumococcal polysaccharide (PPSV23) vaccine. You may need one or two doses if you smoke cigarettes or if you have certain conditions. Talk to your health care provider about which screenings and vaccines you need and how often you need them. This information is not intended to replace advice given to you by your health care provider. Make sure you discuss any questions you have with your health care provider. Document Released: 03/01/2015 Document Revised:  10/23/2015 Document Reviewed: 12/04/2014 Elsevier Interactive Patient Education  2017 [employer] Prevention in the Home Falls can cause injuries. They can happen to people of all ages. There are many things you can do to make your home safe and to help prevent falls. What can I do on the outside of my home?  Regularly fix the edges of walkways and driveways and fix any cracks.  Remove anything that might make you trip as you walk through a door, such as a raised step or threshold.  Trim any bushes or trees on the path to your home.  Use bright outdoor lighting.  Clear any walking paths of anything that might make someone trip, such as rocks or tools.  Regularly check to see if handrails are loose or broken. Make sure that both sides of any steps have handrails.  Any raised decks and porches should have guardrails on the edges.  Have any leaves, snow, or ice cleared regularly.  Use sand or salt on walking paths during winter.  Clean up any spills in your garage right away. This includes oil or grease spills. What can I do in the bathroom?  Use night lights.  Install grab bars by the toilet and in the tub and shower. Do not use towel bars as grab bars.  Use non-skid mats or decals in the tub or shower.  If you need to sit down in the shower, use a plastic,  non-slip stool.  Keep the floor dry. Clean up any water that spills on the floor as soon as it happens.  Remove soap buildup in the tub or shower regularly.  Attach bath mats securely with double-sided non-slip rug tape.  Do not have throw rugs and other things on the floor that can make you trip. What can I do in the bedroom?  Use night lights.  Make sure that you have a light by your bed that is easy to reach.  Do not use any sheets or blankets that are too big for your bed. They should not hang down onto the floor.  Have a firm chair that has side arms. You can use this for support while you get  dressed.  Do not have throw rugs and other things on the floor that can make you trip. What can I do in the kitchen?  Clean up any spills right away.  Avoid walking on wet floors.  Keep items that you use a lot in easy-to-reach places.  If you need to reach something above you, use a strong step stool that has a grab bar.  Keep electrical cords out of the way.  Do not use floor polish or wax that makes floors slippery. If you must use wax, use non-skid floor wax.  Do not have throw rugs and other things on the floor that can make you trip. What can I do with my stairs?  Do not leave any items on the stairs.  Make sure that there are handrails on both sides of the stairs and use them. Fix handrails that are broken or loose. Make sure that handrails are as long as the stairways.  Check any carpeting to make sure that it is firmly attached to the stairs. Fix any carpet that is loose or worn.  Avoid having throw rugs at the top or bottom of the stairs. If you do have throw rugs, attach them to the floor with carpet tape.  Make sure that you have a light switch at the top of the stairs and the bottom of the stairs. If you do not have them, ask someone to add them for you. What else can I do to help prevent falls?  Wear shoes that:  Do not have high heels.  Have rubber bottoms.  Are comfortable and fit you well.  Are closed at the toe. Do not wear sandals.  If you use a stepladder:  Make sure that it is fully opened. Do not climb a closed stepladder.  Make sure that both sides of the stepladder are locked into place.  Ask someone to hold it for you, if possible.  Clearly mark and make sure that you can see:  Any grab bars or handrails.  First and last steps.  Where the edge of each step is.  Use tools that help you move around (mobility aids) if they are needed. These include:  Canes.  Walkers.  Scooters.  Crutches.  Turn on the lights when you go into a  dark area. Replace any light bulbs as soon as they burn out.  Set up your furniture so you have a clear path. Avoid moving your furniture around.  If any of your floors are uneven, fix them.  If there are any pets around you, be aware of where they are.  Review your medicines with your doctor. Some medicines can make you feel dizzy. This can increase your chance of falling. Ask your doctor what other things  that you can do to help prevent falls. This information is not intended to replace advice given to you by your health care provider. Make sure you discuss any questions you have with your health care provider. Document Released: 11/29/2008 Document Revised: 07/11/2015 Document Reviewed: 03/09/2014 Elsevier Interactive Patient Education  2017 [employer].

## 2020-04-06 ENCOUNTER — Other Ambulatory Visit: Payer: Self-pay

## 2020-04-06 ENCOUNTER — Emergency Department (HOSPITAL_COMMUNITY)
Admission: EM | Admit: 2020-04-06 | Discharge: 2020-04-06 | Disposition: A | Discharge status: Home / Self Care | Payer: Medicare Other | Attending: Emergency Medicine | Admitting: Emergency Medicine

## 2020-04-06 ENCOUNTER — Inpatient Hospital Stay (HOSPITAL_COMMUNITY): Admission: RE | Admit: 2020-04-06 | Payer: Self-pay | Source: Ambulatory Visit

## 2020-04-06 DIAGNOSIS — Z041 Encounter for examination and observation following transport accident: Secondary | ICD-10-CM | POA: Diagnosis not present

## 2020-04-06 DIAGNOSIS — Z885 Allergy status to narcotic agent status: Secondary | ICD-10-CM | POA: Diagnosis not present

## 2020-04-06 DIAGNOSIS — Z5321 Procedure and treatment not carried out due to patient leaving prior to being seen by health care provider: Secondary | ICD-10-CM | POA: Diagnosis not present

## 2020-04-06 DIAGNOSIS — M545 Low back pain, unspecified: Secondary | ICD-10-CM | POA: Diagnosis not present

## 2020-04-06 DIAGNOSIS — E119 Type 2 diabetes mellitus without complications: Secondary | ICD-10-CM | POA: Diagnosis not present

## 2020-04-06 DIAGNOSIS — T07XXXA Unspecified multiple injuries, initial encounter: Secondary | ICD-10-CM | POA: Diagnosis not present

## 2020-04-06 DIAGNOSIS — R918 Other nonspecific abnormal finding of lung field: Secondary | ICD-10-CM | POA: Diagnosis not present

## 2020-04-06 DIAGNOSIS — T148XXA Other injury of unspecified body region, initial encounter: Secondary | ICD-10-CM | POA: Diagnosis not present

## 2020-04-06 DIAGNOSIS — R0789 Other chest pain: Secondary | ICD-10-CM | POA: Diagnosis not present

## 2020-04-06 DIAGNOSIS — J9811 Atelectasis: Secondary | ICD-10-CM | POA: Diagnosis not present

## 2020-04-06 DIAGNOSIS — Z20822 Contact with and (suspected) exposure to covid-19: Secondary | ICD-10-CM | POA: Diagnosis not present

## 2020-04-06 DIAGNOSIS — Z79899 Other long term (current) drug therapy: Secondary | ICD-10-CM | POA: Diagnosis not present

## 2020-04-06 DIAGNOSIS — Z7984 Long term (current) use of oral hypoglycemic drugs: Secondary | ICD-10-CM | POA: Diagnosis not present

## 2020-04-06 DIAGNOSIS — Z886 Allergy status to analgesic agent status: Secondary | ICD-10-CM | POA: Diagnosis not present

## 2020-04-06 DIAGNOSIS — I10 Essential (primary) hypertension: Secondary | ICD-10-CM | POA: Diagnosis not present

## 2020-04-06 DIAGNOSIS — Y92410 Unspecified street and highway as the place of occurrence of the external cause: Secondary | ICD-10-CM | POA: Insufficient documentation

## 2020-04-06 DIAGNOSIS — M47814 Spondylosis without myelopathy or radiculopathy, thoracic region: Secondary | ICD-10-CM | POA: Diagnosis not present

## 2020-04-06 DIAGNOSIS — F17210 Nicotine dependence, cigarettes, uncomplicated: Secondary | ICD-10-CM | POA: Diagnosis not present

## 2020-04-06 DIAGNOSIS — R079 Chest pain, unspecified: Secondary | ICD-10-CM | POA: Diagnosis not present

## 2020-04-06 DIAGNOSIS — M542 Cervicalgia: Secondary | ICD-10-CM | POA: Diagnosis not present

## 2020-04-06 DIAGNOSIS — Z888 Allergy status to other drugs, medicaments and biological substances status: Secondary | ICD-10-CM | POA: Diagnosis not present
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
## 2020-04-08 ENCOUNTER — Other Ambulatory Visit (HOSPITAL_COMMUNITY)
Admission: RE | Admit: 2020-04-08 | Discharge: 2020-04-08 | Disposition: A | Discharge status: Home / Self Care | Payer: Medicare Other | Source: Ambulatory Visit | Attending: Cardiology | Admitting: Cardiology

## 2020-04-08 DIAGNOSIS — Z20822 Contact with and (suspected) exposure to covid-19: Secondary | ICD-10-CM | POA: Diagnosis not present

## 2020-04-08 DIAGNOSIS — Z01812 Encounter for preprocedural laboratory examination: Secondary | ICD-10-CM | POA: Diagnosis not present

## 2020-04-08 DIAGNOSIS — I2510 Atherosclerotic heart disease of native coronary artery without angina pectoris: Secondary | ICD-10-CM | POA: Diagnosis present

## 2020-04-09 ENCOUNTER — Ambulatory Visit (HOSPITAL_COMMUNITY)
Admission: RE | Admit: 2020-04-09 | Discharge: 2020-04-09 | Disposition: A | Discharge status: Home / Self Care | Payer: Medicare Other | Attending: Cardiology | Admitting: Cardiology

## 2020-04-09 ENCOUNTER — Encounter (HOSPITAL_COMMUNITY): Payer: Self-pay

## 2020-04-09 ENCOUNTER — Telehealth: Payer: Self-pay

## 2020-04-09 ENCOUNTER — Ambulatory Visit (HOSPITAL_COMMUNITY)
Admission: RE | Disposition: A | Discharge status: Home / Self Care | Payer: Self-pay | Source: Home / Self Care | Attending: Cardiology

## 2020-04-09 ENCOUNTER — Other Ambulatory Visit: Payer: Self-pay

## 2020-04-09 ENCOUNTER — Encounter (HOSPITAL_COMMUNITY): Payer: Self-pay | Admitting: Cardiology

## 2020-04-09 DIAGNOSIS — F17210 Nicotine dependence, cigarettes, uncomplicated: Secondary | ICD-10-CM | POA: Insufficient documentation

## 2020-04-09 DIAGNOSIS — E782 Mixed hyperlipidemia: Secondary | ICD-10-CM | POA: Insufficient documentation

## 2020-04-09 DIAGNOSIS — I25118 Atherosclerotic heart disease of native coronary artery with other forms of angina pectoris: Secondary | ICD-10-CM | POA: Diagnosis not present

## 2020-04-09 DIAGNOSIS — Z7902 Long term (current) use of antithrombotics/antiplatelets: Secondary | ICD-10-CM | POA: Diagnosis not present

## 2020-04-09 DIAGNOSIS — I2584 Coronary atherosclerosis due to calcified coronary lesion: Secondary | ICD-10-CM | POA: Diagnosis not present

## 2020-04-09 DIAGNOSIS — I10 Essential (primary) hypertension: Secondary | ICD-10-CM | POA: Insufficient documentation

## 2020-04-09 DIAGNOSIS — Z79899 Other long term (current) drug therapy: Secondary | ICD-10-CM | POA: Diagnosis not present

## 2020-04-09 DIAGNOSIS — E119 Type 2 diabetes mellitus without complications: Secondary | ICD-10-CM | POA: Diagnosis not present

## 2020-04-09 DIAGNOSIS — I2510 Atherosclerotic heart disease of native coronary artery without angina pectoris: Secondary | ICD-10-CM | POA: Diagnosis present

## 2020-04-09 DIAGNOSIS — Z7982 Long term (current) use of aspirin: Secondary | ICD-10-CM | POA: Insufficient documentation

## 2020-04-09 DIAGNOSIS — Z7984 Long term (current) use of oral hypoglycemic drugs: Secondary | ICD-10-CM | POA: Diagnosis not present

## 2020-04-09 DIAGNOSIS — I209 Angina pectoris, unspecified: Secondary | ICD-10-CM

## 2020-04-09 HISTORY — PX: LEFT HEART CATH AND CORONARY ANGIOGRAPHY: CATH118249

## 2020-04-09 LAB — SARS CORONAVIRUS 2 (TAT 6-24 HRS): SARS Coronavirus 2: NEGATIVE

## 2020-04-09 LAB — GLUCOSE, CAPILLARY: Glucose-Capillary: 83 mg/dL (ref 70–99)

## 2020-04-09 SURGERY — LEFT HEART CATH AND CORONARY ANGIOGRAPHY
Anesthesia: LOCAL

## 2020-04-09 MED ORDER — SODIUM CHLORIDE 0.9% FLUSH: 3.0000 mL | Freq: Two times a day (BID) | INTRAVENOUS | Status: DC

## 2020-04-09 MED ORDER — SODIUM CHLORIDE 0.9 % IV SOLN: 250.0000 mL | INTRAVENOUS | Status: DC | PRN

## 2020-04-09 MED ORDER — HEPARIN SODIUM (PORCINE) 1000 UNIT/ML IJ SOLN
INTRAMUSCULAR | Status: AC
  Filled 2020-04-09: qty 1

## 2020-04-09 MED ORDER — SODIUM CHLORIDE 0.9% FLUSH: 3.0000 mL | INTRAVENOUS | Status: DC | PRN

## 2020-04-09 MED ORDER — LIDOCAINE HCL (PF) 1 % IJ SOLN
INTRAMUSCULAR | Status: DC | PRN
  Administered 2020-04-09: 2 mL

## 2020-04-09 MED ORDER — SODIUM CHLORIDE 0.9 % WEIGHT BASED INFUSION
3.0000 mL/kg/h | INTRAVENOUS | Status: AC
  Administered 2020-04-09: 3 mL/kg/h via INTRAVENOUS

## 2020-04-09 MED ORDER — SODIUM CHLORIDE 0.9 % WEIGHT BASED INFUSION: 1.0000 mL/kg/h | INTRAVENOUS | Status: DC

## 2020-04-09 MED ORDER — ASPIRIN 81 MG PO CHEW: 81.0000 mg | CHEWABLE_TABLET | ORAL | Status: AC

## 2020-04-09 MED ORDER — IOHEXOL 350 MG/ML SOLN
INTRAVENOUS | Status: DC | PRN
  Administered 2020-04-09: 55 mL

## 2020-04-09 MED ORDER — CLOPIDOGREL BISULFATE 75 MG PO TABS: 75.0000 mg | ORAL_TABLET | Freq: Once | ORAL | Status: AC

## 2020-04-09 MED ORDER — CLOPIDOGREL BISULFATE 75 MG PO TABS
ORAL_TABLET | ORAL | Status: AC
  Administered 2020-04-09: 75 mg via ORAL
  Filled 2020-04-09: qty 1

## 2020-04-09 MED ORDER — MIDAZOLAM HCL 2 MG/2ML IJ SOLN
INTRAMUSCULAR | Status: DC | PRN
  Administered 2020-04-09: 2 mg via INTRAVENOUS

## 2020-04-09 MED ORDER — SODIUM CHLORIDE 0.9 % WEIGHT BASED INFUSION: 3.0000 mL/kg/h | INTRAVENOUS | Status: DC

## 2020-04-09 MED ORDER — FENTANYL CITRATE (PF) 100 MCG/2ML IJ SOLN
INTRAMUSCULAR | Status: DC | PRN
  Administered 2020-04-09: 25 ug via INTRAVENOUS

## 2020-04-09 MED ORDER — FENTANYL CITRATE (PF) 100 MCG/2ML IJ SOLN
INTRAMUSCULAR | Status: AC
  Filled 2020-04-09: qty 2

## 2020-04-09 MED ORDER — ISOSORBIDE MONONITRATE ER 30 MG PO TB24
30.0000 mg | ORAL_TABLET | Freq: Every day | ORAL | 2 refills | Status: DC
Start: 2020-04-09 — End: 2020-11-14

## 2020-04-09 MED ORDER — MIDAZOLAM HCL 2 MG/2ML IJ SOLN
INTRAMUSCULAR | Status: AC
  Filled 2020-04-09: qty 2

## 2020-04-09 MED ORDER — LIDOCAINE HCL (PF) 1 % IJ SOLN
INTRAMUSCULAR | Status: AC
  Filled 2020-04-09: qty 30

## 2020-04-09 MED ORDER — VERAPAMIL HCL 2.5 MG/ML IV SOLN
INTRAVENOUS | Status: AC
  Filled 2020-04-09: qty 2

## 2020-04-09 MED ORDER — ASPIRIN 81 MG PO CHEW
CHEWABLE_TABLET | ORAL | Status: AC
  Administered 2020-04-09: 81 mg via ORAL
  Filled 2020-04-09: qty 1

## 2020-04-09 MED ORDER — ONDANSETRON HCL 4 MG/2ML IJ SOLN: 4.0000 mg | Freq: Four times a day (QID) | INTRAMUSCULAR | Status: DC | PRN

## 2020-04-09 MED ORDER — ACETAMINOPHEN 325 MG PO TABS: 650.0000 mg | ORAL_TABLET | ORAL | Status: DC | PRN

## 2020-04-09 MED ORDER — HEPARIN SODIUM (PORCINE) 1000 UNIT/ML IJ SOLN
INTRAMUSCULAR | Status: DC | PRN
  Administered 2020-04-09: 3000 [IU] via INTRAVENOUS

## 2020-04-09 MED ORDER — VERAPAMIL HCL 2.5 MG/ML IV SOLN
INTRAVENOUS | Status: DC | PRN
  Administered 2020-04-09: 5 mL via INTRA_ARTERIAL

## 2020-04-09 MED ORDER — HEPARIN (PORCINE) IN NACL 1000-0.9 UT/500ML-% IV SOLN
INTRAVENOUS | Status: AC
  Filled 2020-04-09: qty 1000

## 2020-04-09 SURGICAL SUPPLY — 10 items
CATH OPTITORQUE TIG 4.0 5F (CATHETERS) ×2 IMPLANT
DEVICE RAD COMP TR BAND LRG (VASCULAR PRODUCTS) ×2 IMPLANT
DEVICE RAD TR BAND REGULAR (VASCULAR PRODUCTS) ×2 IMPLANT
GLIDESHEATH SLEND A-KIT 6F 22G (SHEATH) ×2 IMPLANT
GUIDEWIRE INQWIRE 1.5J.035X260 (WIRE) ×1 IMPLANT
INQWIRE 1.5J .035X260CM (WIRE) ×2
KIT HEART LEFT (KITS) ×2 IMPLANT
PACK CARDIAC CATHETERIZATION (CUSTOM PROCEDURE TRAY) ×2 IMPLANT
TRANSDUCER W/STOPCOCK (MISCELLANEOUS) ×2 IMPLANT
TUBING CIL FLEX 10 FLL-RA (TUBING) ×2 IMPLANT

## 2020-04-09 NOTE — Discharge Instructions (Signed)
 HOLD METFORMIN FOR A FULL 48 HOURS AFTER DISCHARGE.  Radial Site Care  This sheet gives you information about how to care for yourself after your procedure. Your health care provider may also give you more specific instructions. If you have problems or questions, contact your health care provider. What can I expect after the procedure? After the procedure, it is common to have:  Bruising and tenderness at the catheter insertion area. Follow these instructions at home: Medicines  Take over-the-counter and prescription medicines only as told by your health care provider. Insertion site care 1. Follow instructions from your health care provider about how to take care of your insertion site. Make sure you: ? Wash your hands with soap and water before you remove your bandage (dressing). If soap and water are not available, use hand sanitizer. ? May remove dressing in 24 hours. 2. Check your insertion site every day for signs of infection. Check for: ? Redness, swelling, or pain. ? Fluid or blood. ? Pus or a bad smell. ? Warmth. 3. Do no take baths, swim, or use a hot tub for 5 days. 4. You may shower 24-48 hours after the procedure. ? Remove the dressing and gently wash the site with plain soap and water. ? Pat the area dry with a clean towel. ? Do not rub the site. That could cause bleeding. 5. Do not apply powder or lotion to the site. Activity  1. For 24 hours after the procedure, or as directed by your health care provider: ? Do not flex or bend the affected arm. ? Do not push or pull heavy objects with the affected arm. ? Do not drive yourself home from the hospital or clinic. You may drive 24 hours after the procedure. ? Do not operate machinery or power tools. ? KEEP ARM ELEVATED THE REMAINDER OF THE DAY. 2. Do not push, pull or lift anything that is heavier than 10 lb for 5 days. 3. Ask your health care provider when it is okay to: ? Return to work or school. ? Resume  usual physical activities or sports. ? Resume sexual activity. General instructions  If the catheter site starts to bleed, raise your arm and put firm pressure on the site. If the bleeding does not stop, get help right away. This is a medical emergency.  DRINK PLENTY OF FLUIDS FOR THE NEXT 2-3 DAYS.  No alcohol consumption for 24 hours after receiving sedation.  If you went home on the same day as your procedure, a responsible adult should be with you for the first 24 hours after you arrive home.  Keep all follow-up visits as told by your health care provider. This is important. Contact a health care provider if:  You have a fever.  You have redness, swelling, or yellow drainage around your insertion site. Get help right away if:  You have unusual pain at the radial site.  The catheter insertion area swells very fast.  The insertion area is bleeding, and the bleeding does not stop when you hold steady pressure on the area.  Your arm or hand becomes pale, cool, tingly, or numb. These symptoms may represent a serious problem that is an emergency. Do not wait to see if the symptoms will go away. Get medical help right away. Call your local emergency services (911 in the [place] Do not drive yourself to the hospital. Summary  After the procedure, it is common to have bruising and tenderness at the site.  Follow   instructions from your health care provider about how to take care of your radial site wound. Check the wound every day for signs of infection.  This information is not intended to replace advice given to you by your health care provider. Make sure you discuss any questions you have with your health care provider. Document Revised: 03/10/2017 Document Reviewed: 03/10/2017 Elsevier Patient Education  2020 [employer] 

## 2020-04-09 NOTE — Telephone Encounter (Signed)
Angina pectoris ([place]) - Plan: isosorbide mononitrate (IMDUR) 30 MG 24 hr tablet  Coronary atherosclerosis due to calcified coronary lesion - Plan: isosorbide mononitrate (IMDUR) 30 MG 24 hr tablet  Meds ordered this encounter  Medications  . isosorbide mononitrate (IMDUR) 30 MG 24 hr tablet    Sig: Take 1 tablet (30 mg total) by mouth daily.    Dispense:  30 tablet    Refill:  2     [name], MD, [facility] 04/09/2020, 7:55 PM Office: [phone] Pager: [phone]

## 2020-04-09 NOTE — Telephone Encounter (Signed)
Pt called and stated that her medication never got sent in after meeting with you today (04/09/2020). I was unable to figure out which medication it was and she in not sure either.

## 2020-04-09 NOTE — Interval H&P Note (Signed)
History and Physical Interval Note:  04/09/2020 7:42 AM  [name]  has presented today for surgery, with the diagnosis of chest pain.  The various methods of treatment have been discussed with the patient and family. After consideration of risks, benefits and other options for treatment, the patient has consented to  Procedure(s): LEFT HEART CATH AND CORONARY ANGIOGRAPHY (N/A) and coronary angioplasty as a surgical intervention.  The patient's history has been reviewed, patient examined, no change in status, stable for surgery.  I have reviewed the patient's chart and labs.  Questions were answered to the patient's satisfaction.   Cath Lab Visit (complete for each Cath Lab visit)  Clinical Evaluation Leading to the Procedure:   ACS: Yes.    Non-ACS:    Anginal Classification: CCS III  Anti-ischemic medical therapy: Minimal Therapy (1 class of medications)  Non-Invasive Test Results: Intermediate-risk stress test findings: cardiac mortality 1-3%/year  Prior CABG: No previous CABG   [name]

## 2020-04-10 MED FILL — Heparin Sod (Porcine)-NaCl IV Soln 1000 Unit/500ML-0.9%: INTRAVENOUS | Qty: 1000 | Status: AC

## 2020-04-18 ENCOUNTER — Ambulatory Visit: Payer: Medicare Other | Admitting: Cardiology

## 2020-04-23 ENCOUNTER — Encounter: Payer: Self-pay | Admitting: Cardiology

## 2020-04-23 ENCOUNTER — Ambulatory Visit: Payer: Medicare Other | Admitting: Cardiology

## 2020-04-23 ENCOUNTER — Other Ambulatory Visit: Payer: Self-pay

## 2020-04-23 VITALS — BP 128/85 | HR 82 | Temp 98.3°F | Resp 17 | Ht 65.0 in | Wt 126.4 lb

## 2020-04-23 DIAGNOSIS — I2510 Atherosclerotic heart disease of native coronary artery without angina pectoris: Secondary | ICD-10-CM

## 2020-04-23 DIAGNOSIS — E782 Mixed hyperlipidemia: Secondary | ICD-10-CM | POA: Diagnosis not present

## 2020-04-23 DIAGNOSIS — F17200 Nicotine dependence, unspecified, uncomplicated: Secondary | ICD-10-CM | POA: Diagnosis not present

## 2020-04-23 DIAGNOSIS — I2584 Coronary atherosclerosis due to calcified coronary lesion: Secondary | ICD-10-CM

## 2020-04-23 DIAGNOSIS — E119 Type 2 diabetes mellitus without complications: Secondary | ICD-10-CM

## 2020-04-23 DIAGNOSIS — I10 Essential (primary) hypertension: Secondary | ICD-10-CM | POA: Diagnosis not present
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
## 2020-04-24 ENCOUNTER — Telehealth: Payer: Self-pay

## 2020-04-24 DIAGNOSIS — M5416 Radiculopathy, lumbar region: Secondary | ICD-10-CM | POA: Diagnosis not present

## 2020-04-24 DIAGNOSIS — M62830 Muscle spasm of back: Secondary | ICD-10-CM | POA: Diagnosis not present

## 2020-04-24 DIAGNOSIS — G894 Chronic pain syndrome: Secondary | ICD-10-CM | POA: Diagnosis not present

## 2020-04-24 NOTE — Telephone Encounter (Signed)
Tried calling patient to go over his medications patient didn't have her medication in front of her patient states she will call back

## 2020-04-30 ENCOUNTER — Ambulatory Visit: Payer: Medicare Other | Admitting: Nurse Practitioner

## 2020-05-02 ENCOUNTER — Ambulatory Visit: Payer: Medicare Other | Admitting: Nurse Practitioner

## 2020-05-10 ENCOUNTER — Other Ambulatory Visit: Payer: Self-pay

## 2020-05-10 ENCOUNTER — Telehealth: Payer: Self-pay

## 2020-05-10 MED ORDER — CLOPIDOGREL BISULFATE 75 MG PO TABS: 75.0000 mg | ORAL_TABLET | Freq: Every day | ORAL | 1 refills | Status: DC

## 2020-05-10 NOTE — Telephone Encounter (Signed)
Yes

## 2020-05-10 NOTE — Telephone Encounter (Signed)
Patient called for refill of plavix. Can I send this, it was prescribed by another MD

## 2020-05-14 ENCOUNTER — Institutional Professional Consult (permissible substitution): Payer: Medicare Other | Admitting: Pulmonary Disease

## 2020-05-20 ENCOUNTER — Ambulatory Visit: Payer: Medicare Other | Admitting: Nurse Practitioner

## 2020-06-03 ENCOUNTER — Other Ambulatory Visit: Payer: Self-pay | Admitting: Internal Medicine

## 2020-06-03 DIAGNOSIS — E119 Type 2 diabetes mellitus without complications: Secondary | ICD-10-CM

## 2020-06-03 DIAGNOSIS — G629 Polyneuropathy, unspecified: Secondary | ICD-10-CM

## 2020-06-11 ENCOUNTER — Institutional Professional Consult (permissible substitution): Payer: Medicare Other | Admitting: Pulmonary Disease

## 2020-07-03 ENCOUNTER — Other Ambulatory Visit: Payer: Self-pay | Admitting: Cardiology

## 2020-07-03 ENCOUNTER — Telehealth: Payer: Self-pay | Admitting: Internal Medicine

## 2020-07-03 DIAGNOSIS — I2510 Atherosclerotic heart disease of native coronary artery without angina pectoris: Secondary | ICD-10-CM

## 2020-07-03 DIAGNOSIS — I209 Angina pectoris, unspecified: Secondary | ICD-10-CM

## 2020-07-03 DIAGNOSIS — E782 Mixed hyperlipidemia: Secondary | ICD-10-CM

## 2020-07-03 MED ORDER — DIAZEPAM 5 MG PO TABS: ORAL_TABLET | ORAL | 2 refills | Status: DC

## 2020-07-03 NOTE — Telephone Encounter (Signed)
Patient requesting refill for diazepam (VALIUM) 5 MG tablet  [place], [place]  Next appointment 07/04/20

## 2020-07-04 ENCOUNTER — Encounter: Payer: Self-pay | Admitting: Internal Medicine

## 2020-07-04 ENCOUNTER — Other Ambulatory Visit: Payer: Self-pay

## 2020-07-04 ENCOUNTER — Ambulatory Visit (INDEPENDENT_AMBULATORY_CARE_PROVIDER_SITE_OTHER): Payer: Medicare Other | Admitting: Internal Medicine

## 2020-07-04 VITALS — BP 142/68 | HR 56 | Temp 98.1°F | Ht 65.0 in | Wt 126.0 lb

## 2020-07-04 DIAGNOSIS — E538 Deficiency of other specified B group vitamins: Secondary | ICD-10-CM

## 2020-07-04 DIAGNOSIS — F17200 Nicotine dependence, unspecified, uncomplicated: Secondary | ICD-10-CM | POA: Diagnosis not present

## 2020-07-04 DIAGNOSIS — I700 Atherosclerosis of aorta: Secondary | ICD-10-CM

## 2020-07-04 DIAGNOSIS — I10 Essential (primary) hypertension: Secondary | ICD-10-CM | POA: Diagnosis not present

## 2020-07-04 DIAGNOSIS — E1165 Type 2 diabetes mellitus with hyperglycemia: Secondary | ICD-10-CM | POA: Diagnosis not present

## 2020-07-04 DIAGNOSIS — E559 Vitamin D deficiency, unspecified: Secondary | ICD-10-CM

## 2020-07-04 DIAGNOSIS — F419 Anxiety disorder, unspecified: Secondary | ICD-10-CM

## 2020-07-04 DIAGNOSIS — Z0001 Encounter for general adult medical examination with abnormal findings: Secondary | ICD-10-CM | POA: Diagnosis not present

## 2020-07-04 DIAGNOSIS — F32A Depression, unspecified: Secondary | ICD-10-CM

## 2020-07-04 DIAGNOSIS — E782 Mixed hyperlipidemia: Secondary | ICD-10-CM | POA: Diagnosis not present

## 2020-07-04 LAB — HEPATIC FUNCTION PANEL
ALT: 13 U/L (ref 0–35)
AST: 19 U/L (ref 0–37)
Albumin: 4.4 g/dL (ref 3.5–5.2)
Alkaline Phosphatase: 102 U/L (ref 39–117)
Bilirubin, Direct: 0.1 mg/dL (ref 0.0–0.3)
Total Bilirubin: 0.6 mg/dL (ref 0.2–1.2)
Total Protein: 7.4 g/dL (ref 6.0–8.3)

## 2020-07-04 LAB — CBC WITH DIFFERENTIAL/PLATELET
Basophils Absolute: 0 10*3/uL (ref 0.0–0.1)
Basophils Relative: 0.9 % (ref 0.0–3.0)
Eosinophils Absolute: 0.3 10*3/uL (ref 0.0–0.7)
Eosinophils Relative: 5.3 % — ABNORMAL HIGH (ref 0.0–5.0)
HCT: 35.8 % — ABNORMAL LOW (ref 36.0–46.0)
Hemoglobin: 12.2 g/dL (ref 12.0–15.0)
Lymphocytes Relative: 29 % (ref 12.0–46.0)
Lymphs Abs: 1.6 10*3/uL (ref 0.7–4.0)
MCHC: 34 g/dL (ref 30.0–36.0)
MCV: 92.4 fl (ref 78.0–100.0)
Monocytes Absolute: 0.4 10*3/uL (ref 0.1–1.0)
Monocytes Relative: 7.1 % (ref 3.0–12.0)
Neutro Abs: 3.3 10*3/uL (ref 1.4–7.7)
Neutrophils Relative %: 57.7 % (ref 43.0–77.0)
Platelets: 273 10*3/uL (ref 150.0–400.0)
RBC: 3.87 Mil/uL (ref 3.87–5.11)
RDW: 14.5 % (ref 11.5–15.5)
WBC: 5.7 10*3/uL (ref 4.0–10.5)

## 2020-07-04 LAB — LIPID PANEL
Cholesterol: 176 mg/dL (ref 0–200)
HDL: 50.5 mg/dL (ref >39.00)
LDL Cholesterol: 96 mg/dL (ref 0–99)
NonHDL: 125.36
Total CHOL/HDL Ratio: 3
Triglycerides: 147 mg/dL (ref 0.0–149.0)
VLDL: 29.4 mg/dL (ref 0.0–40.0)

## 2020-07-04 LAB — VITAMIN B12: Vitamin B-12: 560 pg/mL (ref 211–911)

## 2020-07-04 LAB — BASIC METABOLIC PANEL
BUN: 15 mg/dL (ref 6–23)
CO2: 27 mEq/L (ref 19–32)
Calcium: 9.8 mg/dL (ref 8.4–10.5)
Chloride: 105 mEq/L (ref 96–112)
Creatinine, Ser: 1.07 mg/dL (ref 0.40–1.20)
GFR: 57.69 mL/min — ABNORMAL LOW (ref >60.00)
Glucose, Bld: 109 mg/dL — ABNORMAL HIGH (ref 70–99)
Potassium: 3.5 mEq/L (ref 3.5–5.1)
Sodium: 142 mEq/L (ref 135–145)

## 2020-07-04 LAB — URINALYSIS, ROUTINE W REFLEX MICROSCOPIC
Bilirubin Urine: NEGATIVE
Ketones, ur: NEGATIVE
Nitrite: POSITIVE — AB
Specific Gravity, Urine: >=1.03 — AB (ref 1.000–1.030)
Urine Glucose: NEGATIVE
Urobilinogen, UA: 0.2 (ref 0.0–1.0)
pH: 6 (ref 5.0–8.0)

## 2020-07-04 LAB — MICROALBUMIN / CREATININE URINE RATIO
Creatinine,U: 272.7 mg/dL
Microalb Creat Ratio: 3.1 mg/g (ref 0.0–30.0)
Microalb, Ur: 8.4 mg/dL — ABNORMAL HIGH (ref 0.0–1.9)

## 2020-07-04 LAB — TSH: TSH: 0.85 u[IU]/mL (ref 0.35–4.50)

## 2020-07-04 LAB — HEMOGLOBIN A1C: Hgb A1c MFr Bld: 6.3 % (ref 4.6–6.5)

## 2020-07-04 LAB — VITAMIN D 25 HYDROXY (VIT D DEFICIENCY, FRACTURES): VITD: 27.91 ng/mL — ABNORMAL LOW (ref 30.00–100.00)

## 2020-07-04 MED ORDER — SERTRALINE HCL 100 MG PO TABS: 100.0000 mg | ORAL_TABLET | Freq: Every day | ORAL | 3 refills | Status: DC

## 2020-07-04 MED ORDER — LOSARTAN POTASSIUM 100 MG PO TABS: 100.0000 mg | ORAL_TABLET | Freq: Every day | ORAL | 3 refills | Status: DC

## 2020-07-04 NOTE — Patient Instructions (Signed)
Please stop smoking  Please take OTC Vitamin D3 at 2000 units per day, indefinitely  Please continue all other medications as before, and refills have been done if requested.  Please have the pharmacy call with any other refills you may need.  Please continue your efforts at being more active, low cholesterol diet, and weight control.  You are otherwise up to date with prevention measures today.  Please keep your appointments with your specialists as you may have planned  Please go to the LAB at the blood drawing area for the tests to be done  You will be contacted by phone if any changes need to be made immediately.  Otherwise, you will receive a letter about your results with an explanation, but please check with MyChart first.  Please remember to sign up for MyChart if you have not done so, as this will be important to you in the future with finding out test results, communicating by private email, and scheduling acute appointments online when needed.  Please make an Appointment to return in 6 months, or sooner if needed
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
## 2020-07-07 ENCOUNTER — Encounter: Payer: Self-pay | Admitting: Internal Medicine

## 2020-07-07 NOTE — Assessment & Plan Note (Signed)
BP Readings from Last 3 Encounters:  07/04/20 (!) 142/68  04/23/20 128/85  04/09/20 (!) 144/60   Stable overall it seems, pt to continue medical treatment as declines any change today, to f/u cardiology next mo

## 2020-07-07 NOTE — Assessment & Plan Note (Signed)
Age and sex appropriate education and counseling updated with regular exercise and diet Referrals for preventative services - pt to call for GYN appt, for colonoscopy later this yr Immunizations addressed - declines covid vax Smoking counseling  - counseled to quit pt not ready Evidence for depression or other mood disorder - none significant Most recent labs reviewed. I have personally reviewed and have noted: 1) the patient's medical and social history 2) The patient's current medications and supplements 3) The patient's height, weight, and BMI have been recorded in the chart

## 2020-07-07 NOTE — Assessment & Plan Note (Signed)
Pt to continue low chol diet, exercise and statin lipitor 40

## 2020-07-07 NOTE — Assessment & Plan Note (Signed)
Last vitamin D Lab Results  Component Value Date   VD25OH 27.91 (L) 07/04/2020   Low, to start oral replacement

## 2020-07-07 NOTE — Assessment & Plan Note (Signed)
Counseled to quit, pt not ready 

## 2020-07-07 NOTE — Assessment & Plan Note (Signed)
Lab Results  Component Value Date   HGBA1C 6.3 07/04/2020   Stable, pt to continue current medical treatment metformin

## 2020-07-07 NOTE — Assessment & Plan Note (Signed)
  Goal ldl < 70 pt to continue current lipitor 40, to check lipids

## 2020-07-24 DIAGNOSIS — M5412 Radiculopathy, cervical region: Secondary | ICD-10-CM | POA: Diagnosis not present

## 2020-07-24 DIAGNOSIS — M5416 Radiculopathy, lumbar region: Secondary | ICD-10-CM | POA: Diagnosis not present

## 2020-07-24 DIAGNOSIS — S134XXD Sprain of ligaments of cervical spine, subsequent encounter: Secondary | ICD-10-CM | POA: Diagnosis not present

## 2020-07-24 DIAGNOSIS — M519 Unspecified thoracic, thoracolumbar and lumbosacral intervertebral disc disorder: Secondary | ICD-10-CM | POA: Diagnosis not present

## 2020-07-25 ENCOUNTER — Ambulatory Visit: Payer: Medicare Other | Admitting: Cardiology

## 2020-08-02 ENCOUNTER — Encounter: Payer: Self-pay | Admitting: Cardiology

## 2020-08-02 ENCOUNTER — Other Ambulatory Visit: Payer: Self-pay

## 2020-08-02 ENCOUNTER — Ambulatory Visit: Payer: Medicare Other | Admitting: Cardiology

## 2020-08-02 DIAGNOSIS — F17210 Nicotine dependence, cigarettes, uncomplicated: Secondary | ICD-10-CM | POA: Diagnosis not present

## 2020-08-02 DIAGNOSIS — F17200 Nicotine dependence, unspecified, uncomplicated: Secondary | ICD-10-CM
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
## 2020-09-13 ENCOUNTER — Telehealth: Payer: Self-pay

## 2020-09-13 NOTE — Telephone Encounter (Signed)
pt is asking for a rx refill of her diazepam (VALIUM) 5 MG tablet be sent in.

## 2020-09-14 ENCOUNTER — Other Ambulatory Visit: Payer: Self-pay | Admitting: Cardiology

## 2020-09-14 DIAGNOSIS — E782 Mixed hyperlipidemia: Secondary | ICD-10-CM

## 2020-09-14 DIAGNOSIS — I209 Angina pectoris, unspecified: Secondary | ICD-10-CM

## 2020-09-14 DIAGNOSIS — I2510 Atherosclerotic heart disease of native coronary artery without angina pectoris: Secondary | ICD-10-CM

## 2020-09-14 DIAGNOSIS — I2584 Coronary atherosclerosis due to calcified coronary lesion: Secondary | ICD-10-CM

## 2020-09-17 MED ORDER — DIAZEPAM 5 MG PO TABS: ORAL_TABLET | ORAL | 2 refills | Status: DC

## 2020-09-17 NOTE — Telephone Encounter (Signed)
Last RF per controlled substance database: 08/16/20 Last OV: 07/04/20 Next OV: 01/07/21

## 2020-09-17 NOTE — Addendum Note (Signed)
Addended by: Biagio Borg on: 09/17/2020 05:07 PM   Modules accepted: Orders

## 2020-09-24 DIAGNOSIS — M5416 Radiculopathy, lumbar region: Secondary | ICD-10-CM | POA: Diagnosis not present

## 2020-10-16 DIAGNOSIS — M25511 Pain in right shoulder: Secondary | ICD-10-CM | POA: Diagnosis not present

## 2020-10-16 DIAGNOSIS — M5459 Other low back pain: Secondary | ICD-10-CM | POA: Diagnosis not present

## 2020-10-16 DIAGNOSIS — G894 Chronic pain syndrome: Secondary | ICD-10-CM | POA: Diagnosis not present

## 2020-10-28 ENCOUNTER — Other Ambulatory Visit: Payer: Self-pay | Admitting: Internal Medicine

## 2020-10-28 DIAGNOSIS — E119 Type 2 diabetes mellitus without complications: Secondary | ICD-10-CM

## 2020-10-28 DIAGNOSIS — G629 Polyneuropathy, unspecified: Secondary | ICD-10-CM

## 2020-10-30 DIAGNOSIS — M545 Low back pain, unspecified: Secondary | ICD-10-CM | POA: Diagnosis not present

## 2020-10-30 DIAGNOSIS — M25511 Pain in right shoulder: Secondary | ICD-10-CM | POA: Diagnosis not present

## 2020-10-31 DIAGNOSIS — M75120 Complete rotator cuff tear or rupture of unspecified shoulder, not specified as traumatic: Secondary | ICD-10-CM | POA: Insufficient documentation

## 2020-11-12 ENCOUNTER — Telehealth: Payer: Self-pay

## 2020-11-12 ENCOUNTER — Telehealth: Payer: Self-pay | Admitting: Internal Medicine

## 2020-11-12 DIAGNOSIS — M25511 Pain in right shoulder: Secondary | ICD-10-CM | POA: Diagnosis not present

## 2020-11-12 DIAGNOSIS — M75120 Complete rotator cuff tear or rupture of unspecified shoulder, not specified as traumatic: Secondary | ICD-10-CM | POA: Diagnosis not present

## 2020-11-12 NOTE — Telephone Encounter (Signed)
Patient calling in requesting last results of A1C test be faxed over to her Ortho Provider  [facility] [address] Esmond Plants [phone]

## 2020-11-12 NOTE — Telephone Encounter (Signed)
Please have her come in and see if she can bring her next of kin.   ST

## 2020-11-13 NOTE — Telephone Encounter (Signed)
Lab results faxed.

## 2020-11-14 ENCOUNTER — Other Ambulatory Visit: Payer: Self-pay | Admitting: Cardiology

## 2020-11-14 ENCOUNTER — Other Ambulatory Visit: Payer: Self-pay | Admitting: Internal Medicine

## 2020-11-14 DIAGNOSIS — I10 Essential (primary) hypertension: Secondary | ICD-10-CM

## 2020-11-14 DIAGNOSIS — K219 Gastro-esophageal reflux disease without esophagitis: Secondary | ICD-10-CM

## 2020-11-14 DIAGNOSIS — I2510 Atherosclerotic heart disease of native coronary artery without angina pectoris: Secondary | ICD-10-CM

## 2020-11-14 DIAGNOSIS — I209 Angina pectoris, unspecified: Secondary | ICD-10-CM

## 2020-11-14 DIAGNOSIS — E782 Mixed hyperlipidemia: Secondary | ICD-10-CM

## 2020-11-14 DIAGNOSIS — I2584 Coronary atherosclerosis due to calcified coronary lesion: Secondary | ICD-10-CM

## 2020-11-14 NOTE — Telephone Encounter (Signed)
Spoke to patient she is scheduled

## 2020-11-14 NOTE — Telephone Encounter (Signed)
Please refill as per office routine med refill policy (all routine meds to be refilled for 3 mo or monthly (per pt preference) up to one year from last visit, then month to month grace period for 3 mo, then further med refills will have to be denied) ? ?

## 2020-11-17 ENCOUNTER — Encounter: Payer: Self-pay | Admitting: Gastroenterology

## 2020-11-19 ENCOUNTER — Other Ambulatory Visit: Payer: Self-pay

## 2020-11-19 ENCOUNTER — Ambulatory Visit: Payer: Medicare Other | Admitting: Cardiology

## 2020-11-19 ENCOUNTER — Telehealth: Payer: Self-pay | Admitting: Internal Medicine

## 2020-11-19 ENCOUNTER — Encounter: Payer: Self-pay | Admitting: Cardiology

## 2020-11-19 VITALS — BP 125/94 | HR 94 | Temp 98.0°F | Resp 16 | Ht 65.0 in | Wt 134.0 lb

## 2020-11-19 DIAGNOSIS — E782 Mixed hyperlipidemia: Secondary | ICD-10-CM

## 2020-11-19 DIAGNOSIS — I2584 Coronary atherosclerosis due to calcified coronary lesion: Secondary | ICD-10-CM | POA: Diagnosis not present

## 2020-11-19 DIAGNOSIS — I10 Essential (primary) hypertension: Secondary | ICD-10-CM

## 2020-11-19 DIAGNOSIS — Z01818 Encounter for other preprocedural examination: Secondary | ICD-10-CM | POA: Diagnosis not present

## 2020-11-19 DIAGNOSIS — E119 Type 2 diabetes mellitus without complications: Secondary | ICD-10-CM

## 2020-11-19 DIAGNOSIS — F17200 Nicotine dependence, unspecified, uncomplicated: Secondary | ICD-10-CM

## 2020-11-19 DIAGNOSIS — I2510 Atherosclerotic heart disease of native coronary artery without angina pectoris: Secondary | ICD-10-CM | POA: Diagnosis not present

## 2020-11-19 NOTE — Telephone Encounter (Signed)
1.Medication Requested: metFORMIN (GLUCOPHAGE) 500 MG tablet 2. Pharmacy (Name, Street, [place]): [facility], [place] Phone:  [phone]  Fax:  [phone]     3. On Med List: Y  4. Last Visit with PCP: 07/04/20  5. Next visit date with PCP: 01/07/21   Agent: Please be advised that RX refills may take up to 3 business days. We ask that you follow-up with your pharmacy.
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
## 2020-11-21 MED ORDER — METFORMIN HCL 500 MG PO TABS: 500.0000 mg | ORAL_TABLET | Freq: Every day | ORAL | 0 refills | Status: DC

## 2020-11-28 DIAGNOSIS — Z9889 Other specified postprocedural states: Secondary | ICD-10-CM | POA: Diagnosis not present

## 2020-11-28 DIAGNOSIS — M75120 Complete rotator cuff tear or rupture of unspecified shoulder, not specified as traumatic: Secondary | ICD-10-CM | POA: Diagnosis not present

## 2020-11-28 DIAGNOSIS — M5412 Radiculopathy, cervical region: Secondary | ICD-10-CM | POA: Diagnosis not present

## 2020-11-28 DIAGNOSIS — M5136 Other intervertebral disc degeneration, lumbar region: Secondary | ICD-10-CM | POA: Diagnosis not present

## 2020-11-28 DIAGNOSIS — M5416 Radiculopathy, lumbar region: Secondary | ICD-10-CM | POA: Diagnosis not present

## 2020-11-28 DIAGNOSIS — G894 Chronic pain syndrome: Secondary | ICD-10-CM | POA: Diagnosis not present

## 2020-11-28 DIAGNOSIS — M5030 Other cervical disc degeneration, unspecified cervical region: Secondary | ICD-10-CM | POA: Diagnosis not present

## 2020-12-24 ENCOUNTER — Other Ambulatory Visit: Payer: Self-pay

## 2020-12-24 DIAGNOSIS — I209 Angina pectoris, unspecified: Secondary | ICD-10-CM

## 2020-12-24 DIAGNOSIS — I2510 Atherosclerotic heart disease of native coronary artery without angina pectoris: Secondary | ICD-10-CM

## 2020-12-24 MED ORDER — NITROGLYCERIN 0.4 MG SL SUBL: 0.4000 mg | SUBLINGUAL_TABLET | SUBLINGUAL | 1 refills | Status: DC | PRN

## 2020-12-26 ENCOUNTER — Telehealth: Payer: Self-pay | Admitting: Internal Medicine

## 2020-12-26 MED ORDER — DIAZEPAM 5 MG PO TABS: ORAL_TABLET | ORAL | 2 refills | Status: DC

## 2020-12-26 NOTE — Telephone Encounter (Signed)
1.Medication Requested: diazepam (VALIUM) 5 MG tablet  2. Pharmacy (Name, [facility], [place]): [facility]  3. On Med List: yes  4. Last Visit with PCP: 07-04-2020  5. Next visit date with PCP: 01-07-2021   Agent: Please be advised that RX refills may take up to 3 business days. We ask that you follow-up with your pharmacy.

## 2021-01-07 ENCOUNTER — Ambulatory Visit: Payer: Medicare Other | Admitting: Internal Medicine

## 2021-01-13 NOTE — Progress Notes (Signed)
Orders requested via Epic.

## 2021-01-14 ENCOUNTER — Other Ambulatory Visit: Payer: Self-pay

## 2021-01-14 ENCOUNTER — Encounter: Payer: Self-pay | Admitting: Internal Medicine

## 2021-01-14 ENCOUNTER — Ambulatory Visit (INDEPENDENT_AMBULATORY_CARE_PROVIDER_SITE_OTHER): Payer: Medicare Other | Admitting: Internal Medicine

## 2021-01-14 VITALS — BP 132/72 | HR 60 | Temp 98.2°F | Ht 65.0 in | Wt 137.0 lb

## 2021-01-14 DIAGNOSIS — G629 Polyneuropathy, unspecified: Secondary | ICD-10-CM

## 2021-01-14 DIAGNOSIS — E119 Type 2 diabetes mellitus without complications: Secondary | ICD-10-CM | POA: Diagnosis not present

## 2021-01-14 DIAGNOSIS — I10 Essential (primary) hypertension: Secondary | ICD-10-CM

## 2021-01-14 DIAGNOSIS — J449 Chronic obstructive pulmonary disease, unspecified: Secondary | ICD-10-CM

## 2021-01-14 DIAGNOSIS — E538 Deficiency of other specified B group vitamins: Secondary | ICD-10-CM | POA: Diagnosis not present

## 2021-01-14 DIAGNOSIS — E559 Vitamin D deficiency, unspecified: Secondary | ICD-10-CM

## 2021-01-14 DIAGNOSIS — E782 Mixed hyperlipidemia: Secondary | ICD-10-CM | POA: Diagnosis not present

## 2021-01-14 DIAGNOSIS — Z23 Encounter for immunization: Secondary | ICD-10-CM | POA: Diagnosis not present

## 2021-01-14 LAB — BASIC METABOLIC PANEL
BUN: 17 mg/dL (ref 6–23)
CO2: 27 mEq/L (ref 19–32)
Calcium: 10.1 mg/dL (ref 8.4–10.5)
Chloride: 106 mEq/L (ref 96–112)
Creatinine, Ser: 0.93 mg/dL (ref 0.40–1.20)
GFR: 68.01 mL/min (ref >60.00)
Glucose, Bld: 102 mg/dL — ABNORMAL HIGH (ref 70–99)
Potassium: 3.8 mEq/L (ref 3.5–5.1)
Sodium: 140 mEq/L (ref 135–145)

## 2021-01-14 LAB — LIPID PANEL
Cholesterol: 210 mg/dL — ABNORMAL HIGH (ref 0–200)
HDL: 59.1 mg/dL (ref >39.00)
LDL Cholesterol: 139 mg/dL — ABNORMAL HIGH (ref 0–99)
NonHDL: 151.01
Total CHOL/HDL Ratio: 4
Triglycerides: 60 mg/dL (ref 0.0–149.0)
VLDL: 12 mg/dL (ref 0.0–40.0)

## 2021-01-14 LAB — HEPATIC FUNCTION PANEL
ALT: 21 U/L (ref 0–35)
AST: 20 U/L (ref 0–37)
Albumin: 4.4 g/dL (ref 3.5–5.2)
Alkaline Phosphatase: 126 U/L — ABNORMAL HIGH (ref 39–117)
Bilirubin, Direct: 0 mg/dL (ref 0.0–0.3)
Total Bilirubin: 0.3 mg/dL (ref 0.2–1.2)
Total Protein: 7.6 g/dL (ref 6.0–8.3)

## 2021-01-14 LAB — VITAMIN D 25 HYDROXY (VIT D DEFICIENCY, FRACTURES): VITD: 31.57 ng/mL (ref 30.00–100.00)

## 2021-01-14 LAB — HEMOGLOBIN A1C: Hgb A1c MFr Bld: 6.7 % — ABNORMAL HIGH (ref 4.6–6.5)

## 2021-01-14 MED ORDER — ALBUTEROL SULFATE HFA 108 (90 BASE) MCG/ACT IN AERS: 2.0000 | INHALATION_SPRAY | Freq: Four times a day (QID) | RESPIRATORY_TRACT | 5 refills | Status: DC | PRN

## 2021-01-14 MED ORDER — GABAPENTIN 300 MG PO CAPS: 300.0000 mg | ORAL_CAPSULE | Freq: Three times a day (TID) | ORAL | 5 refills | Status: DC

## 2021-01-14 MED ORDER — CHOLECALCIFEROL 50 MCG (2000 UT) PO TABS: ORAL_TABLET | ORAL | 99 refills | Status: AC

## 2021-01-14 MED ORDER — KETOCONAZOLE 2 % EX CREA: 1.0000 "application " | TOPICAL_CREAM | Freq: Every day | CUTANEOUS | 2 refills | Status: DC

## 2021-01-14 MED ORDER — ATORVASTATIN CALCIUM 80 MG PO TABS
80.0000 mg | ORAL_TABLET | Freq: Every day | ORAL | 3 refills | Status: DC
Start: 2021-01-14 — End: 2021-08-04

## 2021-01-14 NOTE — Assessment & Plan Note (Signed)
With mild worsening sob - for albuterol hfa prn restart

## 2021-01-14 NOTE — Addendum Note (Signed)
Addended by: Biagio Borg on: 01/14/2021 08:59 PM   Modules accepted: Orders

## 2021-01-14 NOTE — Assessment & Plan Note (Signed)
BP Readings from Last 3 Encounters:  01/14/21 132/72  11/19/20 (!) 125/94  08/02/20 135/72   Stable, pt to continue medical treatment norvasc, losartan, toprol

## 2021-01-14 NOTE — Assessment & Plan Note (Signed)
Last vitamin D Lab Results  Component Value Date   VD25OH 31.57 01/14/2021   Low, to start oral replacement

## 2021-01-14 NOTE — Assessment & Plan Note (Signed)
Lab Results  Component Value Date   HGBA1C 6.7 (H) 01/14/2021   Mild uncontrolled, pt to continue current medical treatment metformin as declines change, goal a1c < 7
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
## 2021-01-14 NOTE — Patient Instructions (Signed)
You had the flu shot today. And the Pneumovax today  Please take all new medication as prescribed - the gabapentin restarting at 300 mg three times per day  Ok to increase the lipitor to 80 mg per day  Please take all new medication as prescribed - the cream for the feet  Please take OTC Vitamin D3 at 2000 units per day, indefinitely  Please continue all other medications as before, and refills have been done if requested - the albuterol  Please have the pharmacy call with any other refills you may need.  Please continue your efforts at being more active, low cholesterol diabetic diet, and weight control.  Please keep your appointments with your specialists as you may have planned - right shoulder surgury soon  You are given the handicapped parking application signed today  Please go to the LAB at the blood drawing area for the tests to be done  You will be contacted by phone if any changes need to be made immediately.  Otherwise, you will receive a letter about your results with an explanation, but please check with MyChart first.  Please remember to sign up for MyChart if you have not done so, as this will be important to you in the future with finding out test results, communicating by private email, and scheduling acute appointments online when needed.  Please make an Appointment to return in 6 months, or sooner if needed, also with Lab Appointment for testing done 3-5 days before at the [facility] (so this is for TWO appointments - please see the scheduling desk as you leave)  Due to the ongoing Covid 19 pandemic, our lab now requires an appointment for any labs done at our office.  If you need labs done and do not have an appointment, please call our office ahead of time to schedule before presenting to the lab for your testing.
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
## 2021-01-14 NOTE — Assessment & Plan Note (Addendum)
Lab Results  Component Value Date   LDLCALC 139 (H) 01/14/2021   Mild uncontrolled, goal ldl < 70, pt to increase lipitor to 80 qd

## 2021-01-14 NOTE — Assessment & Plan Note (Signed)
Uncontrolled, Ok for gabapentin restart 300 tid.  to f/u any worsening symptoms or concerns

## 2021-01-23 ENCOUNTER — Telehealth: Payer: Self-pay | Admitting: Internal Medicine

## 2021-01-23 NOTE — Telephone Encounter (Signed)
Spoke with patient and she states that she has a severe cough and congestion. Observed that patient has SOB while talking with me. Explained to patient that she would need an appointment to appropriately treat her. Patient states that if Dr. Jenny Reichmann did not have an appt for today, she does not wish to see anyone else. Offered an appt with Dr. Maudie Mercury virtually and patient declined. Patient states that she has plans to go out of town tomorrow so she is unable to come in. Per patient will take OTC cough medicine. Advised patient to at least take a home COVID test. Initially, patient declined saying that she has had her vaccinations, then stated that she would take one.

## 2021-01-23 NOTE — Telephone Encounter (Signed)
Patient requesting tessalon rx for cough  Encouraged patient to schedule an ov, patient declined  Patient requesting a call back

## 2021-01-29 NOTE — Progress Notes (Addendum)
Anesthesia Review:  PCP:  DR Cathlean Cower- LOv 01/14/21 Cardiologist : Dr Terri Skains- preop eval on 12/06/20  Chest x-ray : EKG :11/19/20  and 12/16 22  Echo : 2019  Stress test: Cardiac Cath : 03/2020  05/15/20- CT [facility]  Activity level: can do a flight of stairs without difficulty  Sleep Study/ CPAP : none  Fasting Blood Sugar :      / Checks Blood Sugar -- times a day:   Blood Thinner/ Instructions /Last Dose: ASA / Instructions/ Last Dose :   Plavix - stop 5 days prior to surgery per pt  81 mg Aspirin  DM- type 2 checks glucose once daily per pt  Hgba1c- 01/14/21-6.7  On 01/23/2021 pt had a cough per tlelephone encounter to DR Cathlean Cower.  PT did not have covid test done nor did home test as was advised in the telephone encounter.  Called pt today to check on her status and she still has cough.  Denies no fever nor any other symptoms.  Instructed pt to call office of DR Veverly Fells and let them be aware.  Preop nurse also called office of DR Veverly Fells and [facility] for [place] in regards to above. Janett Billow [place], [facility] aware of above.  Will assess S/S on Friday 01/31/21 at preop.  On 01/31/21 called pt to check on her status.  PT stated she still has a cough and she notified Dr Veverly Fells nurse and they told her per pt " not to worry about it ".  Pt today stated on 01/31/21 that she developed cough after receving flu shot and pneumonia vaccine 2 weeks ago.  PT reports she is a smoker.  Also hx  of bronchitis.  Made Janett Billow [place], [facility] aware.  No new orders given.   When pt came in for preop she was afebrile.  No symptoms for cough.  Did cough once at preop only slightly.  Voiced no complaints at preop.   NO covid test- ambulatory surgery.   Pulse at preop was 105-110.  Ekg done.  On 01/31/21.  Janett Billow [facility] aware of EKG.  NO new orders given.

## 2021-01-29 NOTE — Progress Notes (Signed)
Your procedure is scheduled on:       02/03/2021   Report to [facility] Main  Entrance   Report to admitting at    [phone]     Call this number if you have problems the morning of surgery [phone]    REMEMBER: NO  SOLID FOOD CANDY OR GUM AFTER MIDNIGHT. CLEAR LIQUIDS UNTIL    0415am       . NOTHING BY MOUTH EXCEPT CLEAR LIQUIDS UNTIL 0415am    . PLEASE FINISH ENSURE DRINK PER SURGEON ORDER  WHICH NEEDS TO BE COMPLETED AT    0415am   .      CLEAR LIQUID DIET   Foods Allowed                                                                    Coffee and tea, regular and decaf                            Fruit ices (not with fruit pulp)                                      Iced Popsicles                                    Carbonated beverages, regular and diet                                    Cranberry, grape and apple juices Sports drinks like Gatorade Lightly seasoned clear broth or consume(fat free) Sugar, honey syrup ___________________________________________________________________      BRUSH YOUR TEETH MORNING OF SURGERY AND RINSE YOUR MOUTH OUT, NO CHEWING GUM CANDY OR MINTS.     Take these medicines the morning of surgery with A SIP OF WATER:  Inhalers as usual and bring, wellbutrin, amlodipine, nexium, gabapentin, imdur , toprol, zoloft   DO NOT TAKE ANY DIABETIC MEDICATIONS DAY OF YOUR SURGERY                               You may not have any metal on your body including hair pins and              piercings  Do not wear jewelry, make-up, lotions, powders or perfumes, deodorant             Do not wear nail polish on your fingernails.  Do not shave  48 hours prior to surgery.              Men may shave face and neck.   Do not bring valuables to the hospital. [facility].  Contacts, dentures or bridgework may not be worn into surgery.  Leave suitcase in the car. After surgery it may be  brought to  your room.     Patients discharged the day of surgery will not be allowed to drive home. IF YOU ARE HAVING SURGERY AND GOING HOME THE SAME DAY, YOU MUST HAVE AN ADULT TO DRIVE YOU HOME AND BE WITH YOU FOR 24 HOURS. YOU MAY GO HOME BY TAXI OR UBER OR ORTHERWISE, BUT AN ADULT MUST ACCOMPANY YOU HOME AND STAY WITH YOU FOR 24 HOURS.  Name and phone number of your driver:  Special Instructions: N/A              Please read over the following fact sheets you were given: _____________________________________________________________________  [facility] - Preparing for Surgery Before surgery, you can play an important role.  Because skin is not sterile, your skin needs to be as free of germs as possible.  You can reduce the number of germs on your skin by washing with CHG (chlorahexidine gluconate) soap before surgery.  CHG is an antiseptic cleaner which kills germs and bonds with the skin to continue killing germs even after washing. Please DO NOT use if you have an allergy to CHG or antibacterial soaps.  If your skin becomes reddened/irritated stop using the CHG and inform your nurse when you arrive at Short Stay. Do not shave (including legs and underarms) for at least 48 hours prior to the first CHG shower.  You may shave your face/neck. Please follow these instructions carefully:  1.  Shower with CHG Soap the night before surgery and the  morning of Surgery.  2.  If you choose to wash your hair, wash your hair first as usual with your  normal  shampoo.  3.  After you shampoo, rinse your hair and body thoroughly to remove the  shampoo.                           4.  Use CHG as you would any other liquid soap.  You can apply chg directly  to the skin and wash                       Gently with a scrungie or clean washcloth.  5.  Apply the CHG Soap to your body ONLY FROM THE NECK DOWN.   Do not use on face/ open                           Wound or open sores. Avoid contact with eyes, ears mouth  and genitals (private parts).                       Wash face,  Genitals (private parts) with your normal soap.             6.  Wash thoroughly, paying special attention to the area where your surgery  will be performed.  7.  Thoroughly rinse your body with warm water from the neck down.  8.  DO NOT shower/wash with your normal soap after using and rinsing off  the CHG Soap.                9.  Pat yourself dry with a clean towel.            10.  Wear clean pajamas.            11.  Place clean sheets on your bed the night of your first shower and  do not  sleep with pets. Day of Surgery : Do not apply any lotions/deodorants the morning of surgery.  Please wear clean clothes to the hospital/surgery center.  FAILURE TO FOLLOW THESE INSTRUCTIONS MAY RESULT IN THE CANCELLATION OF YOUR SURGERY PATIENT SIGNATURE_________________________________  NURSE SIGNATURE__________________________________  ________________________________________________________________________

## 2021-01-31 ENCOUNTER — Encounter (HOSPITAL_COMMUNITY): Payer: Self-pay

## 2021-01-31 ENCOUNTER — Other Ambulatory Visit: Payer: Self-pay

## 2021-01-31 ENCOUNTER — Encounter (HOSPITAL_COMMUNITY)
Admission: RE | Admit: 2021-01-31 | Discharge: 2021-01-31 | Disposition: A | Discharge status: Home / Self Care | Payer: Medicare Other | Source: Ambulatory Visit | Attending: Orthopedic Surgery | Admitting: Orthopedic Surgery

## 2021-01-31 VITALS — BP 131/78 | HR 105 | Temp 98.9°F | Resp 16 | Ht 65.0 in | Wt 128.0 lb

## 2021-01-31 DIAGNOSIS — I10 Essential (primary) hypertension: Secondary | ICD-10-CM | POA: Diagnosis not present

## 2021-01-31 DIAGNOSIS — Z01818 Encounter for other preprocedural examination: Secondary | ICD-10-CM | POA: Diagnosis not present

## 2021-01-31 DIAGNOSIS — K219 Gastro-esophageal reflux disease without esophagitis: Secondary | ICD-10-CM | POA: Diagnosis not present

## 2021-01-31 DIAGNOSIS — E1140 Type 2 diabetes mellitus with diabetic neuropathy, unspecified: Secondary | ICD-10-CM | POA: Insufficient documentation

## 2021-01-31 DIAGNOSIS — Z7902 Long term (current) use of antithrombotics/antiplatelets: Secondary | ICD-10-CM | POA: Insufficient documentation

## 2021-01-31 DIAGNOSIS — F17210 Nicotine dependence, cigarettes, uncomplicated: Secondary | ICD-10-CM | POA: Insufficient documentation

## 2021-01-31 DIAGNOSIS — Z7982 Long term (current) use of aspirin: Secondary | ICD-10-CM | POA: Diagnosis not present

## 2021-01-31 DIAGNOSIS — I25118 Atherosclerotic heart disease of native coronary artery with other forms of angina pectoris: Secondary | ICD-10-CM | POA: Diagnosis not present

## 2021-01-31 DIAGNOSIS — J449 Chronic obstructive pulmonary disease, unspecified: Secondary | ICD-10-CM | POA: Insufficient documentation

## 2021-01-31 DIAGNOSIS — E119 Type 2 diabetes mellitus without complications: Secondary | ICD-10-CM

## 2021-01-31 DIAGNOSIS — M75101 Unspecified rotator cuff tear or rupture of right shoulder, not specified as traumatic: Secondary | ICD-10-CM | POA: Insufficient documentation

## 2021-01-31 LAB — COMPREHENSIVE METABOLIC PANEL
ALT: 19 U/L (ref 0–44)
AST: 26 U/L (ref 15–41)
Albumin: 3.8 g/dL (ref 3.5–5.0)
Alkaline Phosphatase: 102 U/L (ref 38–126)
Anion gap: 9 (ref 5–15)
BUN: 10 mg/dL (ref 6–20)
CO2: 23 mmol/L (ref 22–32)
Calcium: 8.9 mg/dL (ref 8.9–10.3)
Chloride: 104 mmol/L (ref 98–111)
Creatinine, Ser: 0.97 mg/dL (ref 0.44–1.00)
GFR, Estimated: >60 mL/min (ref >60)
Glucose, Bld: 245 mg/dL — ABNORMAL HIGH (ref 70–99)
Potassium: 3.4 mmol/L — ABNORMAL LOW (ref 3.5–5.1)
Sodium: 136 mmol/L (ref 135–145)
Total Bilirubin: 0.7 mg/dL (ref 0.3–1.2)
Total Protein: 8 g/dL (ref 6.5–8.1)

## 2021-01-31 LAB — CBC
HCT: 37 % (ref 36.0–46.0)
Hemoglobin: 12.5 g/dL (ref 12.0–15.0)
MCH: 30.4 pg (ref 26.0–34.0)
MCHC: 33.8 g/dL (ref 30.0–36.0)
MCV: 90 fL (ref 80.0–100.0)
Platelets: 360 10*3/uL (ref 150–400)
RBC: 4.11 MIL/uL (ref 3.87–5.11)
RDW: 13.2 % (ref 11.5–15.5)
WBC: 8.6 10*3/uL (ref 4.0–10.5)
nRBC: 0 % (ref 0.0–0.2)

## 2021-01-31 LAB — GLUCOSE, CAPILLARY: Glucose-Capillary: 253 mg/dL — ABNORMAL HIGH (ref 70–99)
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
## 2021-02-02 NOTE — Anesthesia Preprocedure Evaluation (Addendum)
Anesthesia Evaluation  Patient identified by MRN, date of birth, ID band Patient awake    Reviewed: Allergy & Precautions, NPO status , Patient's Chart, lab work & pertinent test results  History of Anesthesia Complications (+) PONV and history of anesthetic complications  Airway Mallampati: II  TM Distance: >3 FB Neck ROM: Full    Dental  (+) Missing,    Pulmonary COPD, Current Smoker and Patient abstained from smoking.,    Pulmonary exam normal        Cardiovascular hypertension, Pt. on medications and Pt. on home beta blockers + CAD  Normal cardiovascular exam     Neuro/Psych Anxiety Depression negative neurological ROS     GI/Hepatic Neg liver ROS, GERD  Medicated and Controlled,  Endo/Other  diabetes, Type 2, Oral Hypoglycemic Agents  Renal/GU negative Renal ROS  negative genitourinary   Musculoskeletal  (+) Arthritis  (right shoulder rotator cuff tear),   Abdominal   Peds  Hematology negative hematology ROS (+)   Anesthesia Other Findings Day of surgery medications reviewed with patient.  Reproductive/Obstetrics negative OB ROS                            Anesthesia Physical Anesthesia Plan  ASA: 3  Anesthesia Plan: General   Post-op Pain Management: Tylenol PO (pre-op), Ketamine IV and Dilaudid IV   Induction: Intravenous  PONV Risk Score and Plan: 3 and Midazolam, Ondansetron, Dexamethasone, Treatment may vary due to age or medical condition and Propofol infusion  Airway Management Planned: Oral ETT  Additional Equipment: None  Intra-op Plan:   Post-operative Plan: Extubation in OR  Informed Consent: I have reviewed the patients History and Physical, chart, labs and discussed the procedure including the risks, benefits and alternatives for the proposed anesthesia with the patient or authorized representative who has indicated his/her understanding and acceptance.      Dental advisory given  Plan Discussed with: CRNA  Anesthesia Plan Comments: (Extensive discussion with patient and surgeon in preop regarding ISB. Patient is very concerned about small possibility of phrenic nerve paralysis and feeling SOB after surgery. She would prefer to avoid ISB. Dr. Veverly Fells will inject local to surgical site. Daiva Huge, MD)      Anesthesia Quick Evaluation

## 2021-02-03 ENCOUNTER — Ambulatory Visit (HOSPITAL_COMMUNITY): Payer: Medicare Other | Admitting: Certified Registered Nurse Anesthetist

## 2021-02-03 ENCOUNTER — Encounter (HOSPITAL_COMMUNITY)
Admission: RE | Disposition: A | Discharge status: Home / Self Care | Payer: Self-pay | Source: Home / Self Care | Attending: Orthopedic Surgery

## 2021-02-03 ENCOUNTER — Ambulatory Visit (HOSPITAL_COMMUNITY): Payer: Medicare Other | Admitting: Physician Assistant

## 2021-02-03 ENCOUNTER — Ambulatory Visit: Payer: Medicare Other | Admitting: Cardiology

## 2021-02-03 ENCOUNTER — Encounter (HOSPITAL_COMMUNITY): Payer: Self-pay | Admitting: Orthopedic Surgery

## 2021-02-03 ENCOUNTER — Ambulatory Visit (HOSPITAL_COMMUNITY)
Admission: RE | Admit: 2021-02-03 | Discharge: 2021-02-03 | Disposition: A | Discharge status: Home / Self Care | Payer: Medicare Other | Attending: Orthopedic Surgery | Admitting: Orthopedic Surgery

## 2021-02-03 DIAGNOSIS — M1990 Unspecified osteoarthritis, unspecified site: Secondary | ICD-10-CM | POA: Insufficient documentation

## 2021-02-03 DIAGNOSIS — F419 Anxiety disorder, unspecified: Secondary | ICD-10-CM | POA: Diagnosis not present

## 2021-02-03 DIAGNOSIS — I2510 Atherosclerotic heart disease of native coronary artery without angina pectoris: Secondary | ICD-10-CM | POA: Insufficient documentation

## 2021-02-03 DIAGNOSIS — E1140 Type 2 diabetes mellitus with diabetic neuropathy, unspecified: Secondary | ICD-10-CM | POA: Insufficient documentation

## 2021-02-03 DIAGNOSIS — F17210 Nicotine dependence, cigarettes, uncomplicated: Secondary | ICD-10-CM | POA: Insufficient documentation

## 2021-02-03 DIAGNOSIS — M75101 Unspecified rotator cuff tear or rupture of right shoulder, not specified as traumatic: Secondary | ICD-10-CM | POA: Diagnosis not present

## 2021-02-03 DIAGNOSIS — X58XXXA Exposure to other specified factors, initial encounter: Secondary | ICD-10-CM | POA: Insufficient documentation

## 2021-02-03 DIAGNOSIS — S43431A Superior glenoid labrum lesion of right shoulder, initial encounter: Secondary | ICD-10-CM | POA: Diagnosis not present

## 2021-02-03 DIAGNOSIS — I10 Essential (primary) hypertension: Secondary | ICD-10-CM | POA: Insufficient documentation

## 2021-02-03 DIAGNOSIS — M94211 Chondromalacia, right shoulder: Secondary | ICD-10-CM | POA: Diagnosis not present

## 2021-02-03 DIAGNOSIS — Z7984 Long term (current) use of oral hypoglycemic drugs: Secondary | ICD-10-CM | POA: Diagnosis not present

## 2021-02-03 DIAGNOSIS — I25118 Atherosclerotic heart disease of native coronary artery with other forms of angina pectoris: Secondary | ICD-10-CM

## 2021-02-03 DIAGNOSIS — J449 Chronic obstructive pulmonary disease, unspecified: Secondary | ICD-10-CM | POA: Diagnosis not present

## 2021-02-03 DIAGNOSIS — K219 Gastro-esophageal reflux disease without esophagitis: Secondary | ICD-10-CM | POA: Insufficient documentation

## 2021-02-03 DIAGNOSIS — F32A Depression, unspecified: Secondary | ICD-10-CM | POA: Diagnosis not present

## 2021-02-03 DIAGNOSIS — S46011A Strain of muscle(s) and tendon(s) of the rotator cuff of right shoulder, initial encounter: Secondary | ICD-10-CM | POA: Diagnosis not present

## 2021-02-03 HISTORY — PX: SHOULDER ARTHROSCOPY WITH SUBACROMIAL DECOMPRESSION AND OPEN ROTATOR C: SHX5688

## 2021-02-03 LAB — BASIC METABOLIC PANEL
Anion gap: 9 (ref 5–15)
BUN: 16 mg/dL (ref 6–20)
CO2: 22 mmol/L (ref 22–32)
Calcium: 9.3 mg/dL (ref 8.9–10.3)
Chloride: 110 mmol/L (ref 98–111)
Creatinine, Ser: 0.92 mg/dL (ref 0.44–1.00)
GFR, Estimated: >60 mL/min (ref >60)
Glucose, Bld: 103 mg/dL — ABNORMAL HIGH (ref 70–99)
Potassium: 3.7 mmol/L (ref 3.5–5.1)
Sodium: 141 mmol/L (ref 135–145)

## 2021-02-03 LAB — HEMOGLOBIN A1C
Hgb A1c MFr Bld: 6.8 % — ABNORMAL HIGH (ref 4.8–5.6)
Mean Plasma Glucose: 148.46 mg/dL

## 2021-02-03 LAB — GLUCOSE, CAPILLARY
Glucose-Capillary: 108 mg/dL — ABNORMAL HIGH (ref 70–99)
Glucose-Capillary: 120 mg/dL — ABNORMAL HIGH (ref 70–99)

## 2021-02-03 SURGERY — SHOULDER ARTHROSCOPY WITH SUBACROMIAL DECOMPRESSION AND OPEN ROTATOR CUFF REPAIR, OPEN BICEPS TENDON REPAIR
Anesthesia: General | Laterality: Right

## 2021-02-03 MED ORDER — HYDROMORPHONE HCL 2 MG/ML IJ SOLN
INTRAMUSCULAR | Status: AC
  Filled 2021-02-03: qty 1

## 2021-02-03 MED ORDER — ACETAMINOPHEN 500 MG PO TABS
1000.0000 mg | ORAL_TABLET | Freq: Once | ORAL | Status: AC
  Administered 2021-02-03: 06:00:00 1000 mg via ORAL
  Filled 2021-02-03: qty 2

## 2021-02-03 MED ORDER — FENTANYL CITRATE (PF) 100 MCG/2ML IJ SOLN
INTRAMUSCULAR | Status: AC
  Filled 2021-02-03: qty 2

## 2021-02-03 MED ORDER — KETAMINE HCL 10 MG/ML IJ SOLN
INTRAMUSCULAR | Status: DC | PRN
  Administered 2021-02-03: 20 mg via INTRAVENOUS

## 2021-02-03 MED ORDER — CHLORHEXIDINE GLUCONATE 0.12 % MT SOLN
15.0000 mL | Freq: Once | OROMUCOSAL | Status: AC
  Administered 2021-02-03: 06:00:00 15 mL via OROMUCOSAL

## 2021-02-03 MED ORDER — HYDROMORPHONE HCL 1 MG/ML IJ SOLN
INTRAMUSCULAR | Status: DC | PRN
Start: 2021-02-03 — End: 2021-02-03
  Administered 2021-02-03: .2 mg via INTRAVENOUS
  Administered 2021-02-03: .4 mg via INTRAVENOUS
  Administered 2021-02-03: .2 mg via INTRAVENOUS

## 2021-02-03 MED ORDER — BUPIVACAINE-EPINEPHRINE (PF) 0.25% -1:200000 IJ SOLN
INTRAMUSCULAR | Status: AC
  Filled 2021-02-03: qty 30

## 2021-02-03 MED ORDER — BUPIVACAINE-EPINEPHRINE 0.25% -1:200000 IJ SOLN
INTRAMUSCULAR | Status: DC | PRN
  Administered 2021-02-03: 16 mL
  Administered 2021-02-03: 5 mL

## 2021-02-03 MED ORDER — MIDAZOLAM HCL 5 MG/5ML IJ SOLN
INTRAMUSCULAR | Status: DC | PRN
  Administered 2021-02-03: 2 mg via INTRAVENOUS

## 2021-02-03 MED ORDER — CEFAZOLIN SODIUM-DEXTROSE 2-4 GM/100ML-% IV SOLN
2.0000 g | INTRAVENOUS | Status: AC
  Administered 2021-02-03: 07:00:00 2 g via INTRAVENOUS
  Filled 2021-02-03: qty 100

## 2021-02-03 MED ORDER — BUPIVACAINE-EPINEPHRINE (PF) 0.25% -1:200000 IJ SOLN
INTRAMUSCULAR | Status: AC
  Filled 2021-02-03: qty 60

## 2021-02-03 MED ORDER — ALBUTEROL SULFATE HFA 108 (90 BASE) MCG/ACT IN AERS
INHALATION_SPRAY | RESPIRATORY_TRACT | Status: DC | PRN
  Administered 2021-02-03: 2 via RESPIRATORY_TRACT

## 2021-02-03 MED ORDER — SODIUM CHLORIDE 0.9 % IR SOLN
Status: DC | PRN
  Administered 2021-02-03: 6000 mL

## 2021-02-03 MED ORDER — KETAMINE HCL 10 MG/ML IJ SOLN
INTRAMUSCULAR | Status: AC
  Filled 2021-02-03: qty 1

## 2021-02-03 MED ORDER — ALBUTEROL SULFATE HFA 108 (90 BASE) MCG/ACT IN AERS
INHALATION_SPRAY | RESPIRATORY_TRACT | Status: AC
  Filled 2021-02-03: qty 6.7

## 2021-02-03 MED ORDER — METHOCARBAMOL 500 MG PO TABS: 500.0000 mg | ORAL_TABLET | Freq: Four times a day (QID) | ORAL | 1 refills | Status: DC | PRN

## 2021-02-03 MED ORDER — ROCURONIUM BROMIDE 10 MG/ML (PF) SYRINGE
PREFILLED_SYRINGE | INTRAVENOUS | Status: AC
  Filled 2021-02-03: qty 10

## 2021-02-03 MED ORDER — LACTATED RINGERS IV SOLN: INTRAVENOUS | Status: DC

## 2021-02-03 MED ORDER — HYDROMORPHONE HCL 1 MG/ML IJ SOLN
INTRAMUSCULAR | Status: AC
  Administered 2021-02-03: 10:00:00 0.5 mg via INTRAVENOUS
  Filled 2021-02-03: qty 1

## 2021-02-03 MED ORDER — ONDANSETRON HCL 4 MG/2ML IJ SOLN
INTRAMUSCULAR | Status: DC | PRN
  Administered 2021-02-03: 4 mg via INTRAVENOUS

## 2021-02-03 MED ORDER — DEXAMETHASONE SODIUM PHOSPHATE 10 MG/ML IJ SOLN
INTRAMUSCULAR | Status: AC
  Filled 2021-02-03: qty 1

## 2021-02-03 MED ORDER — PROPOFOL 10 MG/ML IV BOLUS
INTRAVENOUS | Status: AC
  Filled 2021-02-03: qty 20

## 2021-02-03 MED ORDER — HYDROMORPHONE HCL 1 MG/ML IJ SOLN
0.2500 mg | INTRAMUSCULAR | Status: DC | PRN
  Administered 2021-02-03: 10:00:00 0.5 mg via INTRAVENOUS

## 2021-02-03 MED ORDER — PROPOFOL 10 MG/ML IV BOLUS
INTRAVENOUS | Status: DC | PRN
  Administered 2021-02-03: 100 mg via INTRAVENOUS
  Administered 2021-02-03: 40 mg via INTRAVENOUS

## 2021-02-03 MED ORDER — KETOROLAC TROMETHAMINE 30 MG/ML IJ SOLN
INTRAMUSCULAR | Status: DC | PRN
  Administered 2021-02-03: 15 mg via INTRAVENOUS

## 2021-02-03 MED ORDER — SUGAMMADEX SODIUM 200 MG/2ML IV SOLN
INTRAVENOUS | Status: DC | PRN
  Administered 2021-02-03: 120 mg via INTRAVENOUS

## 2021-02-03 MED ORDER — ROCURONIUM BROMIDE 10 MG/ML (PF) SYRINGE
PREFILLED_SYRINGE | INTRAVENOUS | Status: DC | PRN
  Administered 2021-02-03: 40 mg via INTRAVENOUS

## 2021-02-03 MED ORDER — PHENYLEPHRINE HCL-NACL 20-0.9 MG/250ML-% IV SOLN
INTRAVENOUS | Status: AC
  Filled 2021-02-03: qty 750

## 2021-02-03 MED ORDER — ORAL CARE MOUTH RINSE: 15.0000 mL | Freq: Once | OROMUCOSAL | Status: AC

## 2021-02-03 MED ORDER — KETOROLAC TROMETHAMINE 30 MG/ML IJ SOLN
INTRAMUSCULAR | Status: AC
  Filled 2021-02-03: qty 1

## 2021-02-03 MED ORDER — FENTANYL CITRATE (PF) 100 MCG/2ML IJ SOLN
INTRAMUSCULAR | Status: DC | PRN
  Administered 2021-02-03 (×2): 100 ug via INTRAVENOUS

## 2021-02-03 MED ORDER — LIDOCAINE 2% (20 MG/ML) 5 ML SYRINGE
INTRAMUSCULAR | Status: DC | PRN
Start: 2021-02-03 — End: 2021-02-03
  Administered 2021-02-03: 60 mg via INTRAVENOUS

## 2021-02-03 MED ORDER — EPINEPHRINE PF 1 MG/ML IJ SOLN
INTRAMUSCULAR | Status: AC
  Filled 2021-02-03: qty 1

## 2021-02-03 MED ORDER — HYDROMORPHONE HCL 2 MG PO TABS: 2.0000 mg | ORAL_TABLET | ORAL | 0 refills | Status: DC | PRN

## 2021-02-03 MED ORDER — PHENYLEPHRINE HCL-NACL 20-0.9 MG/250ML-% IV SOLN
INTRAVENOUS | Status: DC | PRN
  Administered 2021-02-03: 50 ug/min via INTRAVENOUS

## 2021-02-03 MED ORDER — SCOPOLAMINE 1 MG/3DAYS TD PT72
1.0000 | MEDICATED_PATCH | Freq: Once | TRANSDERMAL | Status: DC
  Administered 2021-02-03: 06:00:00 1.5 mg via TRANSDERMAL
  Filled 2021-02-03: qty 1

## 2021-02-03 MED ORDER — FENTANYL CITRATE PF 50 MCG/ML IJ SOSY: 25.0000 ug | PREFILLED_SYRINGE | INTRAMUSCULAR | Status: DC | PRN

## 2021-02-03 MED ORDER — ONDANSETRON HCL 4 MG PO TABS: 4.0000 mg | ORAL_TABLET | Freq: Every day | ORAL | 1 refills | Status: DC | PRN

## 2021-02-03 MED ORDER — PHENYLEPHRINE 40 MCG/ML (10ML) SYRINGE FOR IV PUSH (FOR BLOOD PRESSURE SUPPORT)
PREFILLED_SYRINGE | INTRAVENOUS | Status: DC | PRN
  Administered 2021-02-03 (×2): 40 ug via INTRAVENOUS
  Administered 2021-02-03: 80 ug via INTRAVENOUS
  Administered 2021-02-03: 120 ug via INTRAVENOUS
  Administered 2021-02-03 (×2): 80 ug via INTRAVENOUS

## 2021-02-03 MED ORDER — MIDAZOLAM HCL 2 MG/2ML IJ SOLN
INTRAMUSCULAR | Status: AC
  Filled 2021-02-03: qty 2

## 2021-02-03 MED ORDER — 0.9 % SODIUM CHLORIDE (POUR BTL) OPTIME
TOPICAL | Status: DC | PRN
  Administered 2021-02-03: 08:00:00 500 mL

## 2021-02-03 MED ORDER — DEXAMETHASONE SODIUM PHOSPHATE 10 MG/ML IJ SOLN
INTRAMUSCULAR | Status: DC | PRN
  Administered 2021-02-03: 5 mg via INTRAVENOUS

## 2021-02-03 MED ORDER — ONDANSETRON HCL 4 MG/2ML IJ SOLN
INTRAMUSCULAR | Status: AC
  Filled 2021-02-03: qty 2

## 2021-02-03 SURGICAL SUPPLY — 67 items
ANCHOR ALL SUT RC W2 1.3 RIB (Anchor) ×4 IMPLANT
ANCHOR ALL-SUT FLEX 1.3 Y-KNOT (Anchor) ×4 IMPLANT
ANCHOR ALL-SUT RC W2 1.3 RIB (Anchor) IMPLANT
BAG COUNTER SPONGE SURGICOUNT (BAG) ×1 IMPLANT
BAG SURGICOUNT SPONGE COUNTING (BAG) ×1
BENZOIN TINCTURE PRP APPL 2/3 (GAUZE/BANDAGES/DRESSINGS) ×2 IMPLANT
BIT DRILL 1.3M DISPOSABLE (BIT) ×2 IMPLANT
BLADE AVERAGE 25MMX9MM (BLADE)
BLADE AVERAGE 25X9 (BLADE) IMPLANT
BLADE EXCALIBUR 4.0MM X 13CM (MISCELLANEOUS) ×1
BLADE EXCALIBUR 4.0X13 (MISCELLANEOUS) ×1 IMPLANT
BLADE SURG 15 STRL LF DISP TIS (BLADE) ×1 IMPLANT
BLADE SURG 15 STRL SS (BLADE) ×4
BLADE SURG SZ10 CARB STEEL (BLADE) ×3 IMPLANT
BURR OVAL 8 FLU 4.0MM X 13CM (MISCELLANEOUS) ×1
BURR OVAL 8 FLU 4.0X13 (MISCELLANEOUS) ×2 IMPLANT
CLOSURE WOUND 1/2 X4 (GAUZE/BANDAGES/DRESSINGS) ×1
CUTTER BONE 4.0MM X 13CM (MISCELLANEOUS) ×3 IMPLANT
DECANTER SPIKE VIAL GLASS SM (MISCELLANEOUS) ×3 IMPLANT
DISSECTOR  3.8MM X 13CM (MISCELLANEOUS) ×2
DISSECTOR 3.8MM X 13CM (MISCELLANEOUS) IMPLANT
DRAPE IMP U-DRAPE 54X76 (DRAPES) ×3 IMPLANT
DRAPE INCISE IOBAN 66X45 STRL (DRAPES) ×3 IMPLANT
DRAPE ORTHO SPLIT 77X108 STRL (DRAPES) ×4
DRAPE STERI 35X30 U-POUCH (DRAPES) ×3 IMPLANT
DRAPE SURG ORHT 6 SPLT 77X108 (DRAPES) ×2 IMPLANT
DRAPE U-SHAPE 47X51 STRL (DRAPES) ×3 IMPLANT
DRSG EMULSION OIL 3X3 NADH (GAUZE/BANDAGES/DRESSINGS) ×3 IMPLANT
DRSG PAD ABDOMINAL 8X10 ST (GAUZE/BANDAGES/DRESSINGS) ×2 IMPLANT
DURAPREP 26ML APPLICATOR (WOUND CARE) ×3 IMPLANT
ELECT NDL TIP 2.8 STRL (NEEDLE) ×1 IMPLANT
ELECT NEEDLE TIP 2.8 STRL (NEEDLE) ×3 IMPLANT
ELECT PENCIL ROCKER SW 15FT (MISCELLANEOUS) ×2 IMPLANT
ELECT REM PT RETURN 15FT ADLT (MISCELLANEOUS) ×3 IMPLANT
GAUZE SPONGE 4X4 12PLY STRL (GAUZE/BANDAGES/DRESSINGS) ×3 IMPLANT
GLOVE SURG ORTHO LTX SZ7.5 (GLOVE) ×3 IMPLANT
GLOVE SURG ORTHO LTX SZ8.5 (GLOVE) ×3 IMPLANT
GLOVE SURG UNDER POLY LF SZ7.5 (GLOVE) ×6 IMPLANT
GLOVE SURG UNDER POLY LF SZ8.5 (GLOVE) ×6 IMPLANT
GOWN STRL REUS W/ TWL LRG LVL3 (GOWN DISPOSABLE) ×3 IMPLANT
GOWN STRL REUS W/TWL LRG LVL3 (GOWN DISPOSABLE) ×6
KIT BASIN OR (CUSTOM PROCEDURE TRAY) ×3 IMPLANT
KIT TURNOVER KIT A (KITS) ×2 IMPLANT
NDL MAYO 6 CRC TAPER PT (NEEDLE) IMPLANT
NDL MAYO CATGUT SZ4 TPR NDL (NEEDLE) IMPLANT
NEEDLE MAYO 6 CRC TAPER PT (NEEDLE) ×6 IMPLANT
NEEDLE MAYO CATGUT SZ4 (NEEDLE) IMPLANT
NS IRRIG 1000ML POUR BTL (IV SOLUTION) ×3 IMPLANT
PACK ARTHROSCOPY WL (CUSTOM PROCEDURE TRAY) ×3 IMPLANT
PORT APPOLLO RF 90DEGREE MULTI (SURGICAL WAND) ×3 IMPLANT
RESTRAINT HEAD UNIVERSAL NS (MISCELLANEOUS) ×3 IMPLANT
SLING ULTRA II L (ORTHOPEDIC SUPPLIES) ×2 IMPLANT
SPONGE T-LAP 4X18 ~~LOC~~+RFID (SPONGE) ×4 IMPLANT
STAPLER VISISTAT 35W (STAPLE) IMPLANT
STRIP CLOSURE SKIN 1/2X4 (GAUZE/BANDAGES/DRESSINGS) ×1 IMPLANT
SUT HI-FI 2 STRAND C-2 40 (SUTURE) ×6 IMPLANT
SUT MNCRL AB 4-0 PS2 18 (SUTURE) ×2 IMPLANT
SUT VIC AB 0 CT1 36 (SUTURE) ×2 IMPLANT
SUT VIC AB 0 CT2 27 (SUTURE) ×2 IMPLANT
SUT VIC AB 2-0 CT1 27 (SUTURE) ×2
SUT VIC AB 2-0 CT1 TAPERPNT 27 (SUTURE) IMPLANT
SYR BULB IRRIG 60ML STRL (SYRINGE) ×3 IMPLANT
TOWEL OR 17X26 10 PK STRL BLUE (TOWEL DISPOSABLE) ×3 IMPLANT
TUBING ARTHROSCOPY IRRIG 16FT (MISCELLANEOUS) ×3 IMPLANT
TUBING CONNECTING 10 (TUBING) ×2 IMPLANT
TUBING CONNECTING 10' (TUBING) ×1
YANKAUER SUCT BULB TIP NO VENT (SUCTIONS) ×2 IMPLANT

## 2021-02-03 NOTE — Discharge Instructions (Signed)
Ice to the shoulder constantly.  Keep the incision covered and clean and dry for one week, then ok to get it wet in the shower. You may remove the Bandages and appy Band Aids over the SteriStrips on Thursday this week.   Do exercise as instructed several times per day.  Do Pendulums- dangle your arm in small circles to loosen it up  Do pillow slides - with your hand and forearm resting on a pillow in your lap slide the hand forward and back  Do Gentle Rotation exercises - with elbow resting on pillow, rotate your forearm in to your stomach (hug) and then like a swinging gate, rotate the arm away from your body(hitch hike)  - back and forth  DO NOT reach behind your back or push up out of a chair with the operative arm.  Use a sling while you are up and around for comfort, may remove while seated.  Keep pillow propped behind the operative elbow.  Follow up with Dr Veverly Fells in two weeks in the office, call [phone] for appt

## 2021-02-03 NOTE — Interval H&P Note (Signed)
History and Physical Interval Note:  02/03/2021 7:09 AM  [name]  has presented today for surgery, with the diagnosis of right shoulder rotator cuff tear biceps tear.  The various methods of treatment have been discussed with the patient and family. After consideration of risks, benefits and other options for treatment, the patient has consented to  Procedure(s) with comments: SHOULDER ARTHROSCOPY WITH SUBACROMIAL DECOMPRESSION AND MINI OPEN ROTATOR CUFF REPAIR,  bicep tenodesis (Right) - with ISB as a surgical intervention.  The patient's history has been reviewed, patient examined, no change in status, stable for surgery.  I have reviewed the patient's chart and labs.  Questions were answered to the patient's satisfaction.     [name]

## 2021-02-03 NOTE — Anesthesia Postprocedure Evaluation (Signed)
Anesthesia Post Note  Patient: [name]  Procedure(s) Performed: SHOULDER ARTHROSCOPY WITH SUBACROMIAL DECOMPRESSION AND MINI OPEN ROTATOR CUFF REPAIR,  bicep tenodesis (Right)     Patient location during evaluation: PACU Anesthesia Type: General Level of consciousness: awake and alert and oriented Pain management: pain level controlled Vital Signs Assessment: post-procedure vital signs reviewed and stable Respiratory status: spontaneous breathing, nonlabored ventilation and respiratory function stable Cardiovascular status: blood pressure returned to baseline Postop Assessment: no apparent nausea or vomiting Anesthetic complications: no   No notable events documented.  Last Vitals:  Vitals:   02/03/21 1015 02/03/21 1030  BP: (!) 140/52 (!) 145/72  Pulse: 87 89  Resp: 17 16  Temp:  36.7 C  SpO2: 98% 100%    Last Pain:  Vitals:   02/03/21 1030  TempSrc:   PainSc: Asleep                 Marthenia Rolling

## 2021-02-03 NOTE — Op Note (Signed)
[name], [name] MEDICAL RECORD NO: [id number] ACCOUNT NO: [id number] DATE OF BIRTH: [birth year] FACILITY: [facility] LOCATION: [facility] PHYSICIAN: Doran Heater. Veverly Fells, MD  Operative Report   DATE OF PROCEDURE: 02/03/2021  PREOPERATIVE DIAGNOSES:  Right shoulder rotator cuff tear, SLAP tear and glenohumeral chondromalacia.  POSTOPERATIVE DIAGNOSES:  Right shoulder rotator cuff tear, SLAP tear and glenohumeral chondromalacia.  PROCEDURE PERFORMED:  Right shoulder arthroscopy with extensive intra-articular debridement of torn superior labrum anterior to posterior, arthroscopic debridement of pan labral tear as well as chondroplasty with arthroscopic subacromial decompression  including CA ligament release with mini open rotator cuff repair, biceps tenodesis in the groove.  ATTENDING SURGEON:  Doran Heater. Veverly Fells, MD.  ASSISTANT:  Charletta Cousin [place], [place], who was scrubbed during the entire procedure, and necessary for satisfactory completion of surgery.  ANESTHESIA: General anesthesia was used plus local anesthesia.  ESTIMATED BLOOD LOSS:  Less than 50 mL.  FLUID REPLACEMENT:  1000 mL crystalloid.  COUNTS:  Instrument counts correct.   COMPLICATIONS:  No complications.    ANTIBIOTICS:  Perioperative antibiotics were given.  INDICATIONS:  The patient is a 60 year old female with a history of worsening right shoulder pain secondary to a documented rotator cuff tear as well as a SLAP tear and biceps split tear.  The patient has failed conservative management and reports pain  at night, pain with activities of daily living despite conservative management.  The patient presents now for operative shoulder arthroscopy with mini open cuff repair and biceps tenodesis to relieve pain and restore function.  Informed consent obtained.  DESCRIPTION OF PROCEDURE:  After an adequate level of general anesthesia was achieved, the patient was positioned in modified beach chair position.  Right shoulder  correctly identified and sterilely prepped and draped in the usual manner.  Timeout  called, verifying correct patient, correct site.  We examined the patient's shoulder under anesthesia revealing full passive range of motion with no stiffness, no instability.  After completion of our EUA, we entered the shoulder using standard portals,  including anterior, posterior and lateral portals.  We identified severe tearing of the biceps tendon intra-articular portion with an unstable biceps anchor.  We performed a biceps tenotomy and labral debridement back to stable labral rim.  The anterior  inferior labrum was also torn.  We debrided that using basket forceps and motorized shaver down to stable cartilage.  The subscap was intact.  The supraspinatus and infraspinatus were torn with a high-grade partial tear and a full-thickness tear.  Teres  minor was intact.  Following completion of our intra-articular debridement, which was extensive, we placed the scope in the subacromial space.  Bursectomy was performed followed by acromioplasty, creating a type 1 acromial shape with a butcher block  technique using a high-speed burr.  We did release the CA ligament.  Rotator cuff was torn from the bursal side.  We did not violate the inferior AC ligament.  At this point, we concluded the arthroscopic portion of procedure.  We then made a small mini  open incision starting at the anterolateral border of the acromion extending distally about 4 cm in the raphae between the anterior and lateral heads of the deltoid.  Dissection down through subcutaneous tissues, we identified that deltoid raphae and  divided that with a needle tip Bovie, placed Arthrex retractor and exposed the subdeltoid space.  We identified the biceps tendon.  We delivered it out of the wound.  We actually had to tubularize it as it was basically butterflied open and  we reinforced  the portion of the tendon used for the tenodesis site with #2 Hi-Fi suture.   We then prepped the floor of the biceps groove with a needle tip Bovie and a Freer elevator getting down to bleeding bone. We took Y-Knot Flex anchor through the floor of the  biceps groove and brought that suture up through the reinforced tendon tying that down, flushed with a mattress stitch.  We then took the longitudinal whipstitch sutures up through the rotator interval and tied over a soft tissue bridge incorporating  part of the subscap tendon.  Thus, we had a really nice solid repair.  We then went to the rotator cuff tear.  This was a large tear involving two tendons.  We mobilized the free edge of the supraspinatus and infraspinatus tendon.  We had to do some side  to side as well as medial and lateral translation.  We used a Cobb elevator, mobilized the tendon on both the bursal side and the joint side.  We had #2 Hi-Fi sutures and the free edge of the tendon x 4.  We had two side-to-side #2 Hi-Fi's.  We used a  total of 2 Y-Knot RC anchors double loaded with ribbon.  We prepped the greater tuberosity with rongeur and curette getting down to bleeding bone.  We then placed the anchors and brought the sutures up in a double mattress fashion through the medial part  of the footprint for the rotator cuff.  We then brought the lateral sutures down through drill holes and tied over the bone bridge. Combining with the side to side and the suture anchor sutures and the lateral sutures through drill holes, we had a  double row repair that was watertight and full thickness.  We removed quite a bit of tendinopathic tissue and got back to really good tendon despite the patient being a smoker, we feel good about the healing potential.  There was no tension with the  patient's arm at her side. We irrigated thoroughly and repaired the deltoid anatomically with 0 Vicryl suture followed by 2-0 Vicryl for subcutaneous closure and 4-0 Monocryl for skin.  Steri-Strips were applied followed by sterile dressing.  The  patient  tolerated surgery well.       PAA D: 02/03/2021 9:47:29 am T: 02/03/2021 11:33:00 pm  JOB: [id number]/ [id number]

## 2021-02-03 NOTE — Transfer of Care (Signed)
Immediate Anesthesia Transfer of Care Note  Patient: [name]  Procedure(s) Performed: SHOULDER ARTHROSCOPY WITH SUBACROMIAL DECOMPRESSION AND MINI OPEN ROTATOR CUFF REPAIR,  bicep tenodesis (Right)  Patient Location: PACU  Anesthesia Type:General  Level of Consciousness: awake, alert , oriented and patient cooperative  Airway & Oxygen Therapy: Patient Spontanous Breathing and Patient connected to face mask oxygen  Post-op Assessment: Report given to RN and Post -op Vital signs reviewed and stable  Post vital signs: Reviewed and stable  Last Vitals:  Vitals Value Taken Time  BP 147/95 02/03/21 0945  Temp    Pulse 93 02/03/21 0948  Resp 14 02/03/21 0948  SpO2 100 % 02/03/21 0948  Vitals shown include unvalidated device data.  Last Pain:  Vitals:   02/03/21 0553  TempSrc:   PainSc: 8       Patients Stated Pain Goal: 4 (73/22/02 5427)  Complications: No notable events documented.

## 2021-02-03 NOTE — Brief Op Note (Signed)
02/03/2021  9:40 AM  PATIENT:  [name]  60 y.o. female  PRE-OPERATIVE DIAGNOSIS:  right shoulder rotator cuff tear, biceps tear  POST-OPERATIVE DIAGNOSIS:  right shoulder rotator cuff tear, biceps tear,  chondromalacia of glenohumeral joint and SLAP tear  PROCEDURE:  Procedure(s) with comments: SHOULDER ARTHROSCOPY WITH SUBACROMIAL DECOMPRESSION AND MINI OPEN ROTATOR CUFF REPAIR,  bicep tenodesis (Right) - with ISB extensive debridement of SLAP and labrum tear and chondroplasty  SURGEON:  Surgeon(s) and Role:    Netta Cedars, MD - Primary  PHYSICIAN ASSISTANT:   ASSISTANTS: [place] Bruns, PA-C   ANESTHESIA:   general and local  EBL:  minimal   BLOOD ADMINISTERED:none  DRAINS: none   LOCAL MEDICATIONS USED:  MARCAINE     SPECIMEN:  No Specimen  DISPOSITION OF SPECIMEN:  N/A  COUNTS:  YES  TOURNIQUET:  * No tourniquets in log *  DICTATION: .Other Dictation: Dictation Number [id number]  PLAN OF CARE: Discharge to home after PACU  PATIENT DISPOSITION:  PACU - hemodynamically stable.   Delay start of Pharmacological VTE agent (>24hrs) due to surgical blood loss or risk of bleeding: not applicable

## 2021-02-03 NOTE — Anesthesia Procedure Notes (Addendum)
Procedure Name: Intubation Date/Time: 02/03/2021 7:28 AM Performed by: Brennan Bailey, MD Pre-anesthesia Checklist: Patient identified, Emergency Drugs available, Suction available, Patient being monitored and Timeout performed Patient Re-evaluated:Patient Re-evaluated prior to induction Oxygen Delivery Method: Circle system utilized Preoxygenation: Pre-oxygenation with 100% oxygen Induction Type: IV induction Ventilation: Mask ventilation without difficulty Laryngoscope Size: Mac, Glidescope and 3 Tube type: Oral Tube size: 7.0 mm Number of attempts: 3 Airway Equipment and Method: Stylet and Video-laryngoscopy Placement Confirmation: ETT inserted through vocal cords under direct vision, positive ETCO2, CO2 detector and breath sounds checked- equal and bilateral Secured at: 21 cm Tube secured with: Tape Dental Injury: Teeth and Oropharynx as per pre-operative assessment  Difficulty Due To: Difficulty was unanticipated and Difficult Airway- due to anterior larynx Future Recommendations: Recommend- induction with short-acting agent, and alternative techniques readily available Comments: Inadequate view of glottis with Mac 3 and Miller 3 blades (grade 3 view with Miller 3). Anterior airway noted. Atraumatic Glidesccope #3 intubation. VSS. SpO2>95% throughout attempts.

## 2021-02-04 ENCOUNTER — Encounter (HOSPITAL_COMMUNITY): Payer: Self-pay | Admitting: Orthopedic Surgery

## 2021-02-11 DIAGNOSIS — M25611 Stiffness of right shoulder, not elsewhere classified: Secondary | ICD-10-CM | POA: Diagnosis not present

## 2021-02-11 DIAGNOSIS — M25511 Pain in right shoulder: Secondary | ICD-10-CM | POA: Diagnosis not present

## 2021-02-14 ENCOUNTER — Other Ambulatory Visit: Payer: Self-pay | Admitting: Internal Medicine

## 2021-02-14 ENCOUNTER — Other Ambulatory Visit: Payer: Self-pay | Admitting: Cardiology

## 2021-02-14 ENCOUNTER — Telehealth: Payer: Self-pay

## 2021-02-14 DIAGNOSIS — E119 Type 2 diabetes mellitus without complications: Secondary | ICD-10-CM

## 2021-02-14 DIAGNOSIS — I209 Angina pectoris, unspecified: Secondary | ICD-10-CM

## 2021-02-14 DIAGNOSIS — I10 Essential (primary) hypertension: Secondary | ICD-10-CM

## 2021-02-14 DIAGNOSIS — I2510 Atherosclerotic heart disease of native coronary artery without angina pectoris: Secondary | ICD-10-CM

## 2021-02-14 DIAGNOSIS — F32A Depression, unspecified: Secondary | ICD-10-CM

## 2021-02-14 MED ORDER — DIAZEPAM 5 MG PO TABS: ORAL_TABLET | ORAL | 0 refills | Status: DC

## 2021-02-14 NOTE — Telephone Encounter (Signed)
Patient notified via voicemail.

## 2021-02-14 NOTE — Telephone Encounter (Signed)
Pt requesting to increase valium until pain is controled from recent Shoulder surgery 12/19. The meds that were called in is on back order per pt. I advised pt to reach back out to surgeon for a different medication.  Please update pt.

## 2021-02-14 NOTE — Telephone Encounter (Signed)
Ok this time only - done rx

## 2021-02-19 DIAGNOSIS — M25611 Stiffness of right shoulder, not elsewhere classified: Secondary | ICD-10-CM | POA: Diagnosis not present

## 2021-02-19 DIAGNOSIS — M25511 Pain in right shoulder: Secondary | ICD-10-CM | POA: Diagnosis not present

## 2021-02-21 ENCOUNTER — Telehealth: Payer: Self-pay | Admitting: Cardiology

## 2021-02-21 ENCOUNTER — Telehealth: Payer: Self-pay | Admitting: *Deleted

## 2021-02-21 NOTE — Telephone Encounter (Signed)
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  Returned call to patient to get fax where to send. Spoke with patient and she will pick up when letter is ready for pick up.

## 2021-02-21 NOTE — Telephone Encounter (Signed)
Patient says she recently had a heart procedure and is requesting a note to excuse her for school. She made an appointment for 02/25/21 and says she would pick it up when she arrived for her appointment.

## 2021-02-21 NOTE — Telephone Encounter (Signed)
Her last heart procedure was a heart catheterization back in February 2022.  Review of epic does not illustrate that she had a heart procedure recently.  She had orthopedic surgery in December 2022.  Please get more information in regards to what heart procedures she recently had.  Dr. Terri Skains

## 2021-02-24 DIAGNOSIS — M25511 Pain in right shoulder: Secondary | ICD-10-CM | POA: Diagnosis not present

## 2021-02-24 DIAGNOSIS — R69 Illness, unspecified: Secondary | ICD-10-CM | POA: Diagnosis not present

## 2021-02-24 DIAGNOSIS — M25611 Stiffness of right shoulder, not elsewhere classified: Secondary | ICD-10-CM | POA: Diagnosis not present

## 2021-02-24 NOTE — Telephone Encounter (Signed)
Spoke to patient she said she is referring to her catheterization back in February 2022 she needs it for school purposes they are asking her to go back and provide proof of all her absences. She needs the note to state from the date she had her catheterization till she went back. Patient stated she had her catheterization and was out for a week please advise

## 2021-02-25 ENCOUNTER — Ambulatory Visit: Payer: Medicare Other | Admitting: Cardiology

## 2021-02-27 DIAGNOSIS — M25611 Stiffness of right shoulder, not elsewhere classified: Secondary | ICD-10-CM | POA: Diagnosis not present

## 2021-02-27 DIAGNOSIS — M25511 Pain in right shoulder: Secondary | ICD-10-CM | POA: Diagnosis not present

## 2021-02-28 NOTE — Telephone Encounter (Signed)
Please formulate a letter regarding her leave of absence in February 2022.  Dr. Terri Skains

## 2021-03-03 DIAGNOSIS — M25511 Pain in right shoulder: Secondary | ICD-10-CM | POA: Diagnosis not present

## 2021-03-03 DIAGNOSIS — M25611 Stiffness of right shoulder, not elsewhere classified: Secondary | ICD-10-CM | POA: Diagnosis not present

## 2021-03-03 DIAGNOSIS — R69 Illness, unspecified: Secondary | ICD-10-CM | POA: Diagnosis not present

## 2021-03-07 DIAGNOSIS — R69 Illness, unspecified: Secondary | ICD-10-CM | POA: Diagnosis not present

## 2021-03-14 NOTE — Telephone Encounter (Signed)
Patient's letter has been done patient is picking up on her next office visit

## 2021-03-18 ENCOUNTER — Ambulatory Visit: Payer: Medicare Other | Admitting: Cardiology

## 2021-03-18 ENCOUNTER — Encounter: Payer: Self-pay | Admitting: Cardiology

## 2021-03-18 ENCOUNTER — Other Ambulatory Visit: Payer: Self-pay

## 2021-03-18 VITALS — BP 159/88 | HR 61 | Temp 97.3°F | Resp 16 | Ht 65.0 in | Wt 134.0 lb

## 2021-03-18 DIAGNOSIS — I10 Essential (primary) hypertension: Secondary | ICD-10-CM | POA: Diagnosis not present

## 2021-03-18 DIAGNOSIS — I2510 Atherosclerotic heart disease of native coronary artery without angina pectoris: Secondary | ICD-10-CM | POA: Diagnosis not present

## 2021-03-18 DIAGNOSIS — F17200 Nicotine dependence, unspecified, uncomplicated: Secondary | ICD-10-CM | POA: Diagnosis not present

## 2021-03-18 DIAGNOSIS — I2584 Coronary atherosclerosis due to calcified coronary lesion: Secondary | ICD-10-CM | POA: Diagnosis not present

## 2021-03-18 DIAGNOSIS — M25611 Stiffness of right shoulder, not elsewhere classified: Secondary | ICD-10-CM | POA: Diagnosis not present

## 2021-03-18 DIAGNOSIS — M25511 Pain in right shoulder: Secondary | ICD-10-CM | POA: Diagnosis not present

## 2021-03-18 DIAGNOSIS — E782 Mixed hyperlipidemia: Secondary | ICD-10-CM

## 2021-03-18 DIAGNOSIS — E119 Type 2 diabetes mellitus without complications: Secondary | ICD-10-CM | POA: Diagnosis not present

## 2021-03-18 DIAGNOSIS — R69 Illness, unspecified: Secondary | ICD-10-CM | POA: Diagnosis not present

## 2021-03-18 MED ORDER — ISOSORB DINITRATE-HYDRALAZINE 20-37.5 MG PO TABS: 1.0000 | ORAL_TABLET | Freq: Three times a day (TID) | ORAL | 0 refills | Status: DC
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
## 2021-03-24 DIAGNOSIS — M25511 Pain in right shoulder: Secondary | ICD-10-CM | POA: Diagnosis not present

## 2021-03-24 DIAGNOSIS — M25611 Stiffness of right shoulder, not elsewhere classified: Secondary | ICD-10-CM | POA: Diagnosis not present

## 2021-03-25 DIAGNOSIS — Z4789 Encounter for other orthopedic aftercare: Secondary | ICD-10-CM | POA: Diagnosis not present

## 2021-03-25 DIAGNOSIS — M25512 Pain in left shoulder: Secondary | ICD-10-CM | POA: Diagnosis not present

## 2021-03-27 ENCOUNTER — Telehealth: Payer: Self-pay | Admitting: Internal Medicine

## 2021-03-27 NOTE — Telephone Encounter (Signed)
Left message for patient to call back to schedule Medicare Annual Wellness Visit   Last AWV  04/05/20  Please schedule at anytime with [facility] if patient calls the office back.    40 Minutes appointment   Any questions, please call me at [phone]

## 2021-03-28 DIAGNOSIS — M25611 Stiffness of right shoulder, not elsewhere classified: Secondary | ICD-10-CM | POA: Diagnosis not present

## 2021-03-28 DIAGNOSIS — M25511 Pain in right shoulder: Secondary | ICD-10-CM | POA: Diagnosis not present

## 2021-03-31 ENCOUNTER — Encounter (HOSPITAL_COMMUNITY): Payer: Self-pay | Admitting: *Deleted

## 2021-03-31 ENCOUNTER — Emergency Department (HOSPITAL_COMMUNITY)
Admission: EM | Admit: 2021-03-31 | Discharge: 2021-03-31 | Disposition: A | Discharge status: Home / Self Care | Payer: Medicare Other | Attending: Emergency Medicine | Admitting: Emergency Medicine

## 2021-03-31 ENCOUNTER — Other Ambulatory Visit: Payer: Self-pay

## 2021-03-31 DIAGNOSIS — Y93G9 Activity, other involving cooking and grilling: Secondary | ICD-10-CM | POA: Insufficient documentation

## 2021-03-31 DIAGNOSIS — T310 Burns involving less than 10% of body surface: Secondary | ICD-10-CM | POA: Insufficient documentation

## 2021-03-31 DIAGNOSIS — S60521A Blister (nonthermal) of right hand, initial encounter: Secondary | ICD-10-CM | POA: Diagnosis not present

## 2021-03-31 DIAGNOSIS — D6869 Other thrombophilia: Secondary | ICD-10-CM | POA: Diagnosis not present

## 2021-03-31 DIAGNOSIS — E119 Type 2 diabetes mellitus without complications: Secondary | ICD-10-CM | POA: Diagnosis not present

## 2021-03-31 DIAGNOSIS — X102XXA Contact with fats and cooking oils, initial encounter: Secondary | ICD-10-CM | POA: Insufficient documentation

## 2021-03-31 DIAGNOSIS — T23271A Burn of second degree of right wrist, initial encounter: Secondary | ICD-10-CM | POA: Insufficient documentation

## 2021-03-31 DIAGNOSIS — Z7984 Long term (current) use of oral hypoglycemic drugs: Secondary | ICD-10-CM | POA: Insufficient documentation

## 2021-03-31 DIAGNOSIS — T300 Burn of unspecified body region, unspecified degree: Secondary | ICD-10-CM

## 2021-03-31 DIAGNOSIS — Z23 Encounter for immunization: Secondary | ICD-10-CM | POA: Insufficient documentation

## 2021-03-31 DIAGNOSIS — T24211A Burn of second degree of right thigh, initial encounter: Secondary | ICD-10-CM | POA: Diagnosis not present

## 2021-03-31 DIAGNOSIS — I2510 Atherosclerotic heart disease of native coronary artery without angina pectoris: Secondary | ICD-10-CM | POA: Diagnosis not present

## 2021-03-31 DIAGNOSIS — G894 Chronic pain syndrome: Secondary | ICD-10-CM | POA: Diagnosis not present

## 2021-03-31 DIAGNOSIS — T24011A Burn of unspecified degree of right thigh, initial encounter: Secondary | ICD-10-CM | POA: Diagnosis present

## 2021-03-31 MED ORDER — TETANUS-DIPHTHERIA TOXOIDS TD 5-2 LFU IM INJ
0.5000 mL | INJECTION | Freq: Once | INTRAMUSCULAR | Status: AC
Start: 2021-03-31 — End: 2021-03-31
  Administered 2021-03-31: 0.5 mL via INTRAMUSCULAR
  Filled 2021-03-31: qty 0.5

## 2021-03-31 MED ORDER — OXYCODONE-ACETAMINOPHEN 5-325 MG PO TABS: 1.0000 | ORAL_TABLET | Freq: Three times a day (TID) | ORAL | 0 refills | Status: DC | PRN

## 2021-03-31 MED ORDER — ACETAMINOPHEN 500 MG PO TABS
1000.0000 mg | ORAL_TABLET | Freq: Once | ORAL | Status: AC
Start: 2021-03-31 — End: 2021-03-31
  Administered 2021-03-31: 1000 mg via ORAL
  Filled 2021-03-31: qty 2
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
## 2021-03-31 NOTE — ED Notes (Signed)
Patient right thigh wrapped with wet to dry dressing. Right arm done with same. Patient ambulatory to lobby
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
## 2021-03-31 NOTE — Discharge Instructions (Signed)
Return for any problem.  [facility] [phone]. Call today for an appointment.

## 2021-04-02 ENCOUNTER — Ambulatory Visit (INDEPENDENT_AMBULATORY_CARE_PROVIDER_SITE_OTHER): Payer: Medicare Other | Admitting: Internal Medicine

## 2021-04-02 ENCOUNTER — Other Ambulatory Visit: Payer: Self-pay

## 2021-04-02 VITALS — BP 126/76 | HR 77 | Temp 98.1°F | Ht 65.0 in | Wt 138.5 lb

## 2021-04-02 DIAGNOSIS — F419 Anxiety disorder, unspecified: Secondary | ICD-10-CM | POA: Diagnosis not present

## 2021-04-02 DIAGNOSIS — X102XXA Contact with fats and cooking oils, initial encounter: Secondary | ICD-10-CM | POA: Diagnosis not present

## 2021-04-02 DIAGNOSIS — E119 Type 2 diabetes mellitus without complications: Secondary | ICD-10-CM | POA: Diagnosis not present

## 2021-04-02 DIAGNOSIS — T23271D Burn of second degree of right wrist, subsequent encounter: Secondary | ICD-10-CM | POA: Insufficient documentation

## 2021-04-02 DIAGNOSIS — T23271A Burn of second degree of right wrist, initial encounter: Secondary | ICD-10-CM | POA: Diagnosis not present

## 2021-04-02 DIAGNOSIS — T24211D Burn of second degree of right thigh, subsequent encounter: Secondary | ICD-10-CM

## 2021-04-02 DIAGNOSIS — T24211A Burn of second degree of right thigh, initial encounter: Secondary | ICD-10-CM

## 2021-04-02 DIAGNOSIS — G894 Chronic pain syndrome: Secondary | ICD-10-CM

## 2021-04-02 MED ORDER — KETOROLAC TROMETHAMINE 30 MG/ML IJ SOLN: 30.0000 mg | Freq: Once | INTRAMUSCULAR | Status: DC

## 2021-04-02 MED ORDER — DIAZEPAM 5 MG PO TABS: ORAL_TABLET | ORAL | 0 refills | Status: DC

## 2021-04-02 MED ORDER — DOXYCYCLINE HYCLATE 100 MG PO TABS: 100.0000 mg | ORAL_TABLET | Freq: Two times a day (BID) | ORAL | 0 refills | Status: DC

## 2021-04-02 MED ORDER — OXYCODONE-ACETAMINOPHEN 5-325 MG PO TABS: 1.0000 | ORAL_TABLET | Freq: Three times a day (TID) | ORAL | 0 refills | Status: DC | PRN

## 2021-04-02 MED ORDER — KETOROLAC TROMETHAMINE 30 MG/ML IJ SOLN
30.0000 mg | Freq: Once | INTRAMUSCULAR | Status: AC
  Administered 2021-04-02: 30 mg via INTRAMUSCULAR
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
## 2021-04-06 DIAGNOSIS — T24011A Burn of unspecified degree of right thigh, initial encounter: Secondary | ICD-10-CM | POA: Insufficient documentation

## 2021-04-06 NOTE — Assessment & Plan Note (Signed)
Lab Results  Component Value Date   HGBA1C 6.8 (H) 02/03/2021   Stable, pt to continue current medical treatment metformin
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
## 2021-04-06 NOTE — Assessment & Plan Note (Addendum)
For toradol 30 mg IM now, doxycycline, silvadene cream asd, oxycodone refill, and f/u burn clinic feb 21

## 2021-04-06 NOTE — Assessment & Plan Note (Signed)
[place] for increased valium 5 bid prn

## 2021-04-07 NOTE — Telephone Encounter (Signed)
Note discarded Rx refill went in on 04/02/21

## 2021-04-08 DIAGNOSIS — T2122XA Burn of second degree of abdominal wall, initial encounter: Secondary | ICD-10-CM | POA: Diagnosis not present

## 2021-04-08 DIAGNOSIS — T24291A Burn of second degree of multiple sites of right lower limb, except ankle and foot, initial encounter: Secondary | ICD-10-CM | POA: Insufficient documentation

## 2021-04-08 DIAGNOSIS — T310 Burns involving less than 10% of body surface: Secondary | ICD-10-CM | POA: Diagnosis not present

## 2021-04-08 DIAGNOSIS — T24291D Burn of second degree of multiple sites of right lower limb, except ankle and foot, subsequent encounter: Secondary | ICD-10-CM | POA: Diagnosis not present

## 2021-04-08 DIAGNOSIS — G8911 Acute pain due to trauma: Secondary | ICD-10-CM | POA: Insufficient documentation

## 2021-04-08 DIAGNOSIS — Y93G3 Activity, cooking and baking: Secondary | ICD-10-CM | POA: Insufficient documentation

## 2021-04-08 DIAGNOSIS — T22291A Burn of second degree of multiple sites of right shoulder and upper limb, except wrist and hand, initial encounter: Secondary | ICD-10-CM | POA: Diagnosis not present

## 2021-04-08 DIAGNOSIS — T2122XD Burn of second degree of abdominal wall, subsequent encounter: Secondary | ICD-10-CM | POA: Diagnosis not present

## 2021-04-08 DIAGNOSIS — T22241D Burn of second degree of right axilla, subsequent encounter: Secondary | ICD-10-CM | POA: Diagnosis not present

## 2021-04-28 DIAGNOSIS — T22291D Burn of second degree of multiple sites of right shoulder and upper limb, except wrist and hand, subsequent encounter: Secondary | ICD-10-CM | POA: Diagnosis not present

## 2021-04-28 DIAGNOSIS — T24291D Burn of second degree of multiple sites of right lower limb, except ankle and foot, subsequent encounter: Secondary | ICD-10-CM | POA: Diagnosis not present

## 2021-04-28 DIAGNOSIS — G8911 Acute pain due to trauma: Secondary | ICD-10-CM | POA: Diagnosis not present

## 2021-04-28 DIAGNOSIS — T310 Burns involving less than 10% of body surface: Secondary | ICD-10-CM | POA: Diagnosis not present

## 2021-04-29 ENCOUNTER — Ambulatory Visit (INDEPENDENT_AMBULATORY_CARE_PROVIDER_SITE_OTHER): Payer: Medicare Other

## 2021-04-29 DIAGNOSIS — Z1211 Encounter for screening for malignant neoplasm of colon: Secondary | ICD-10-CM

## 2021-04-29 DIAGNOSIS — Z122 Encounter for screening for malignant neoplasm of respiratory organs: Secondary | ICD-10-CM

## 2021-04-29 DIAGNOSIS — Z1239 Encounter for other screening for malignant neoplasm of breast: Secondary | ICD-10-CM

## 2021-04-29 DIAGNOSIS — Z0000 Encounter for general adult medical examination without abnormal findings: Secondary | ICD-10-CM
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
## 2021-04-29 NOTE — Patient Instructions (Signed)
[name] , ?Thank you for taking time to come for your Medicare Wellness Visit. I appreciate your ongoing commitment to your health goals. Please review the following plan we discussed and let me know if I can assist you in the future.  ? ?Screening recommendations/referrals: ?Colonoscopy: 09/14/2017; due every 3 years (Order placed 04/29/2021) ?Mammogram: 01/09/2019; due every year (Order placed 04/29/2021) ?Bone Density: never done ?Recommended yearly ophthalmology/optometry visit for glaucoma screening and checkup ?Recommended yearly dental visit for hygiene and checkup ? ?Vaccinations: ?Influenza vaccine: 01/14/2021 ?Pneumococcal vaccine: 01/30/2017, 01/14/2021 ?Tdap vaccine: 03/31/2021; due every 10 years ?Shingles vaccine: never done  ?Covid-19: never done ? ?Advanced directives: Advance directive discussed with you today. Even though you declined this today please call our office should you change your mind and we can give you the proper paperwork for you to fill out. ? ?Conditions/risks identified: Yes; Client understands the importance of follow-up appointments with providers by attending scheduled visits and discussed goals to eat healthier, increase physical activity 5 times a week for 30 minutes each, exercise the brain by doing stimulating brain exercises (reading, adult coloring, crafting, listening to music, puzzles, etc.), socialize and enjoy life more, get enough sleep at least 8-9 hours average per night and make time for laughter. ? ?Next appointment: Please schedule your next Medicare Wellness Visit with your Nurse Health Advisor in 1 year by calling [phone]. ? ?Preventive Care 40-64 Years, Female ?Preventive care refers to lifestyle choices and visits with your health care provider that can promote health and wellness. ?What does preventive care include? ?A yearly physical exam. This is also called an annual well check. ?Dental exams once or twice a year. ?Routine eye exams. Ask your  health care provider how often you should have your eyes checked. ?Personal lifestyle choices, including: ?Daily care of your teeth and gums. ?Regular physical activity. ?Eating a healthy diet. ?Avoiding tobacco and drug use. ?Limiting alcohol use. ?Practicing safe sex. ?Taking low-dose aspirin daily starting at age 53. ?Taking vitamin and mineral supplements as recommended by your health care provider. ?What happens during an annual well check? ?The services and screenings done by your health care provider during your annual well check will depend on your age, overall health, lifestyle risk factors, and family history of disease. ?Counseling  ?Your health care provider may ask you questions about your: ?Alcohol use. ?Tobacco use. ?Drug use. ?Emotional well-being. ?Home and relationship well-being. ?Sexual activity. ?Eating habits. ?Work and work Statistician. ?Method of birth control. ?Menstrual cycle. ?Pregnancy history. ?Screening  ?You may have the following tests or measurements: ?Height, weight, and BMI. ?Blood pressure. ?Lipid and cholesterol levels. These may be checked every 5 years, or more frequently if you are over 34 years old. ?Skin check. ?Lung cancer screening. You may have this screening every year starting at age 55 if you have a 30-pack-year history of smoking and currently smoke or have quit within the past 15 years. ?Fecal occult blood test (FOBT) of the stool. You may have this test every year starting at age 36. ?Flexible sigmoidoscopy or colonoscopy. You may have a sigmoidoscopy every 5 years or a colonoscopy every 10 years starting at age 37. ?Hepatitis C blood test. ?Hepatitis B blood test. ?Sexually transmitted disease (STD) testing. ?Diabetes screening. This is done by checking your blood sugar (glucose) after you have not eaten for a while (fasting). You may have this done every 1-3 years. ?Mammogram. This may be done every 1-2 years. Talk to your health care provider about when  you  should start having regular mammograms. This may depend on whether you have a family history of breast cancer. ?BRCA-related cancer screening. This may be done if you have a family history of breast, ovarian, tubal, or peritoneal cancers. ?Pelvic exam and Pap test. This may be done every 3 years starting at age 21. Starting at age 47, this may be done every 5 years if you have a Pap test in combination with an HPV test. ?Bone density scan. This is done to screen for osteoporosis. You may have this scan if you are at high risk for osteoporosis. ?Discuss your test results, treatment options, and if necessary, the need for more tests with your health care provider. ?Vaccines  ?Your health care provider may recommend certain vaccines, such as: ?Influenza vaccine. This is recommended every year. ?Tetanus, diphtheria, and acellular pertussis (Tdap, Td) vaccine. You may need a Td booster every 10 years. ?Zoster vaccine. You may need this after age 55. ?Pneumococcal 13-valent conjugate (PCV13) vaccine. You may need this if you have certain conditions and were not previously vaccinated. ?Pneumococcal polysaccharide (PPSV23) vaccine. You may need one or two doses if you smoke cigarettes or if you have certain conditions. ?Talk to your health care provider about which screenings and vaccines you need and how often you need them. ?This information is not intended to replace advice given to you by your health care provider. Make sure you discuss any questions you have with your health care provider. ?Document Released: 03/01/2015 Document Revised: 10/23/2015 Document Reviewed: 12/04/2014 ?Elsevier Interactive Patient Education ? 2017 [employer] ? ? ? ?Fall Prevention in the Home ?Falls can cause injuries. They can happen to people of all ages. There are many things you can do to make your home safe and to help prevent falls. ?What can I do on the outside of my home? ?Regularly fix the edges of walkways and driveways and fix  any cracks. ?Remove anything that might make you trip as you walk through a door, such as a raised step or threshold. ?Trim any bushes or trees on the path to your home. ?Use bright outdoor lighting. ?Clear any walking paths of anything that might make someone trip, such as rocks or tools. ?Regularly check to see if handrails are loose or broken. Make sure that both sides of any steps have handrails. ?Any raised decks and porches should have guardrails on the edges. ?Have any leaves, snow, or ice cleared regularly. ?Use sand or salt on walking paths during winter. ?Clean up any spills in your garage right away. This includes oil or grease spills. ?What can I do in the bathroom? ?Use night lights. ?Install grab bars by the toilet and in the tub and shower. Do not use towel bars as grab bars. ?Use non-skid mats or decals in the tub or shower. ?If you need to sit down in the shower, use a plastic, non-slip stool. ?Keep the floor dry. Clean up any water that spills on the floor as soon as it happens. ?Remove soap buildup in the tub or shower regularly. ?Attach bath mats securely with double-sided non-slip rug tape. ?Do not have throw rugs and other things on the floor that can make you trip. ?What can I do in the bedroom? ?Use night lights. ?Make sure that you have a light by your bed that is easy to reach. ?Do not use any sheets or blankets that are too big for your bed. They should not hang down onto the floor. ?Have a  firm chair that has side arms. You can use this for support while you get dressed. ?Do not have throw rugs and other things on the floor that can make you trip. ?What can I do in the kitchen? ?Clean up any spills right away. ?Avoid walking on wet floors. ?Keep items that you use a lot in easy-to-reach places. ?If you need to reach something above you, use a strong step stool that has a grab bar. ?Keep electrical cords out of the way. ?Do not use floor polish or wax that makes floors slippery. If you  must use wax, use non-skid floor wax. ?Do not have throw rugs and other things on the floor that can make you trip. ?What can I do with my stairs? ?Do not leave any items on the stairs. ?Make sure that there are ha

## 2021-05-05 ENCOUNTER — Other Ambulatory Visit: Payer: Self-pay | Admitting: Cardiology

## 2021-05-05 ENCOUNTER — Other Ambulatory Visit: Payer: Self-pay | Admitting: Internal Medicine

## 2021-05-05 DIAGNOSIS — I10 Essential (primary) hypertension: Secondary | ICD-10-CM

## 2021-05-05 DIAGNOSIS — I209 Angina pectoris, unspecified: Secondary | ICD-10-CM

## 2021-05-05 DIAGNOSIS — K219 Gastro-esophageal reflux disease without esophagitis: Secondary | ICD-10-CM

## 2021-05-05 DIAGNOSIS — I2510 Atherosclerotic heart disease of native coronary artery without angina pectoris: Secondary | ICD-10-CM

## 2021-05-06 DIAGNOSIS — M25611 Stiffness of right shoulder, not elsewhere classified: Secondary | ICD-10-CM | POA: Diagnosis not present

## 2021-05-06 DIAGNOSIS — M25511 Pain in right shoulder: Secondary | ICD-10-CM | POA: Diagnosis not present

## 2021-06-02 LAB — HM DIABETES EYE EXAM

## 2021-06-13 ENCOUNTER — Other Ambulatory Visit: Payer: Self-pay

## 2021-06-13 DIAGNOSIS — Z87891 Personal history of nicotine dependence: Secondary | ICD-10-CM

## 2021-06-13 DIAGNOSIS — F17210 Nicotine dependence, cigarettes, uncomplicated: Secondary | ICD-10-CM

## 2021-06-13 DIAGNOSIS — Z122 Encounter for screening for malignant neoplasm of respiratory organs: Secondary | ICD-10-CM

## 2021-06-19 DIAGNOSIS — Z4789 Encounter for other orthopedic aftercare: Secondary | ICD-10-CM | POA: Diagnosis not present

## 2021-06-19 DIAGNOSIS — M7021 Olecranon bursitis, right elbow: Secondary | ICD-10-CM | POA: Diagnosis not present

## 2021-06-19 DIAGNOSIS — M19019 Primary osteoarthritis, unspecified shoulder: Secondary | ICD-10-CM | POA: Insufficient documentation

## 2021-06-19 DIAGNOSIS — M19012 Primary osteoarthritis, left shoulder: Secondary | ICD-10-CM | POA: Diagnosis not present

## 2021-07-02 ENCOUNTER — Encounter: Payer: Self-pay | Admitting: Gastroenterology

## 2021-07-02 ENCOUNTER — Ambulatory Visit (INDEPENDENT_AMBULATORY_CARE_PROVIDER_SITE_OTHER): Payer: Medicare Other | Admitting: Gastroenterology

## 2021-07-02 ENCOUNTER — Other Ambulatory Visit: Payer: Self-pay | Admitting: Internal Medicine

## 2021-07-02 ENCOUNTER — Telehealth: Payer: Self-pay

## 2021-07-02 ENCOUNTER — Other Ambulatory Visit: Payer: Self-pay | Admitting: Cardiology

## 2021-07-02 VITALS — BP 142/82 | HR 84 | Ht 65.0 in | Wt 141.0 lb

## 2021-07-02 DIAGNOSIS — K219 Gastro-esophageal reflux disease without esophagitis: Secondary | ICD-10-CM

## 2021-07-02 DIAGNOSIS — I209 Angina pectoris, unspecified: Secondary | ICD-10-CM

## 2021-07-02 DIAGNOSIS — Z86010 Personal history of colonic polyps: Secondary | ICD-10-CM | POA: Diagnosis not present

## 2021-07-02 DIAGNOSIS — E119 Type 2 diabetes mellitus without complications: Secondary | ICD-10-CM

## 2021-07-02 DIAGNOSIS — Z7902 Long term (current) use of antithrombotics/antiplatelets: Secondary | ICD-10-CM | POA: Diagnosis not present

## 2021-07-02 DIAGNOSIS — G629 Polyneuropathy, unspecified: Secondary | ICD-10-CM

## 2021-07-02 MED ORDER — ESOMEPRAZOLE MAGNESIUM 20 MG PO CPDR: 20.0000 mg | DELAYED_RELEASE_CAPSULE | Freq: Two times a day (BID) | ORAL | 11 refills | Status: DC

## 2021-07-02 MED ORDER — NA SULFATE-K SULFATE-MG SULF 17.5-3.13-1.6 GM/177ML PO SOLN: 1.0000 | Freq: Once | ORAL | 0 refills | Status: AC

## 2021-07-02 NOTE — Progress Notes (Signed)
? ? ?Assessment   ?  ?Personal history of multiple adenomatous colon polyps due for surveillance colonoscopy ?GERD, phlegm in the mornings is not likely GERD related ?CAD on Plavix and aspirin ? ? ?Recommendations  ?  ?Schedule colonoscopy. The risks (including bleeding, perforation, infection, missed lesions, medication reactions and possible hospitalization or surgery if complications occur), benefits, and alternatives to colonoscopy with possible biopsy and possible polypectomy were discussed with the patient and they consent to proceed.   ?Increase Nexium to 20 mg p.o. twice daily taken 30 minutes before breakfast and dinner and assess response to morning phlegm ?Hold Plavix 5 days before procedure - will instruct when and how to resume after procedure. Low but real risk of cardiovascular event such as heart attack, stroke, embolism, thrombosis or ischemia/infarct of other organs off Plavix explained and need to seek urgent help if this occurs. The patient consents to proceed. Will communicate by phone or EMR with patient's prescribing provider to confirm that holding Plavix is reasonable in this case.  Continue EC aspirin 81 mg daily through the procedure. ? ? ?HPI  ?  ?This is a 61 year old female with a history of multiple adenomatous colon polyps as below.  She is due for surveillance colonoscopy.  She relates problems with significant phlegm in the morning times.  She does not have any active reflux symptoms on Nexium 20 mg daily.  She is maintained on Plavix and aspirin for CAD. Denies weight loss, abdominal pain, constipation, diarrhea, change in stool caliber, melena, hematochezia, nausea, vomiting, dysphagia, reflux symptoms, chest pain.  ? ?Colonoscopy 08/2017 ?- Four 5 to 7 mm polyps in the sigmoid colon and in the transverse colon, removed with a cold snare. Resected and retrieved. Tubular adenomas ?- One 4 mm polyp in the descending colon, removed with a cold biopsy forceps. Resected and retrieved.  Hyperplastic  ?- Internal hemorrhoids. ?- The examination was otherwise normal on direct and retroflexion views. ? ? ?Labs / Imaging  ?  ? ?  Latest Ref Rng & Units 01/31/2021  ? 11:44 AM 01/14/2021  ? 12:11 PM 07/04/2020  ? 11:58 AM  ?Hepatic Function  ?Total Protein 6.5 - 8.1 g/dL 8.0   7.6   7.4    ?Albumin 3.5 - 5.0 g/dL 3.8   4.4   4.4    ?AST 15 - 41 U/L $Remo'26   20   19    'XUkwx$ ?ALT 0 - 44 U/L $Remo'19   21   13    'OnZHa$ ?Alk Phosphatase 38 - 126 U/L 102   126   102    ?Total Bilirubin 0.3 - 1.2 mg/dL 0.7   0.3   0.6    ?Bilirubin, Direct 0.0 - 0.3 mg/dL  0.0   0.1    ? ? ? ?  Latest Ref Rng & Units 01/31/2021  ? 11:44 AM 07/04/2020  ? 11:58 AM 04/03/2020  ? 12:18 PM  ?CBC  ?WBC 4.0 - 10.5 K/uL 8.6   5.7   5.6    ?Hemoglobin 12.0 - 15.0 g/dL 12.5   12.2   12.2    ?Hematocrit 36.0 - 46.0 % 37.0   35.8   36.4    ?Platelets 150 - 400 K/uL 360   273.0   296    ? ? ?No results found. ? ? ?Current Medications, Allergies, Past Medical History, Past Surgical History, Family History and Social History were reviewed in [employer] record. ? ? ?Physical Exam: ?General: Well  developed, well nourished, no acute distress ?Head: Normocephalic and atraumatic ?Eyes: Sclerae anicteric, EOMI ?Ears: Normal auditory acuity ?Mouth: Not examined, mask on during Covid-19 pandemic ?Lungs: Clear throughout to auscultation ?Heart: Regular rate and rhythm; no murmurs, rubs or bruits ?Abdomen: Soft, non tender and non distended. No masses, hepatosplenomegaly or hernias noted. Normal Bowel sounds ?Rectal: Deferred to colonoscopy  ?Musculoskeletal: Symmetrical with no gross deformities  ?Pulses:  Normal pulses noted ?Extremities: No clubbing, cyanosis, edema or deformities noted ?Neurological: Alert oriented x 4, grossly nonfocal ?Psychological:  Alert and cooperative. Normal mood and affect ? ? ?Pricilla Riffle. Fuller Plan, MD 07/02/2021, 11:05 AM  ?

## 2021-07-02 NOTE — Patient Instructions (Signed)
Increase your Nexium 20 mg to twice daily. A new prescription has been sent to your pharmacy.  ? ?You have been scheduled for a colonoscopy. Please follow written instructions given to you at your visit today.  ?Please pick up your prep supplies at the pharmacy within the next 1-3 days. ?If you use inhalers (even only as needed), please bring them with you on the day of your procedure. ? ?The [facility] providers would like to encourage you to use [facility] to communicate with providers for non-urgent requests or questions.  Due to long hold times on the telephone, sending your provider a message by [facility] may be a faster and more efficient way to get a response.  Please allow 48 business hours for a response.  Please remember that this is for non-urgent requests.  ? ?Due to recent changes in healthcare laws, you may see the results of your imaging and laboratory studies on MyChart before your provider has had a chance to review them.  We understand that in some cases there may be results that are confusing or concerning to you. Not all laboratory results come back in the same time frame and the provider may be waiting for multiple results in order to interpret others.  Please give [place] 48 hours in order for your provider to thoroughly review all the results before contacting the office for clarification of your results.  ? ?Thank you for choosing me and [facility]. ? ?Malcolm T. Dagoberto Ligas., MD., [facility] ? ?

## 2021-07-02 NOTE — Telephone Encounter (Signed)
? ?  [name] ?1960/07/29 ?[id number] ? ?Dear Dr. Hermelinda Dellen: ? ?We have scheduled the above named patient for a(n) colonoscopy procedure. Our records show that (s)he is on anticoagulation therapy. ? ?Please advise as to whether the patient may come off their therapy of Plavix 5 days prior to their procedure which is scheduled for 08/27/21. ? ?Please route your response or fax to Dorisann Frames, [place] at [phone] ? ?Sincerely, ? ? ? ?[facility] ? ? ?

## 2021-07-04 ENCOUNTER — Encounter: Payer: Self-pay | Admitting: Acute Care

## 2021-07-04 ENCOUNTER — Ambulatory Visit (INDEPENDENT_AMBULATORY_CARE_PROVIDER_SITE_OTHER): Payer: Medicare Other | Admitting: Acute Care

## 2021-07-04 DIAGNOSIS — F17210 Nicotine dependence, cigarettes, uncomplicated: Secondary | ICD-10-CM

## 2021-07-04 NOTE — Patient Instructions (Signed)
Thank you for participating in the [facility]. It was our pleasure to meet you today. We will call you with the results of your scan within the next few days. Your scan will be assigned a Lung RADS category score by the physicians reading the scans.  This Lung RADS score determines follow up scanning.  See below for description of categories, and follow up screening recommendations. We will be in touch to schedule your follow up screening annually or based on recommendations of our providers. We will fax a copy of your scan results to your Primary Care Physician, or the physician who referred you to the program, to ensure they have the results. Please call the office if you have any questions or concerns regarding your scanning experience or results.  Our office number is [phone]. Please speak with Denise Phelps, RN. , or  Denise Buckner RN, They are  our Lung Cancer Screening RN.'s If They are unavailable when you call, Please leave a message on the voice mail. We will return your call at our earliest convenience.This voice mail is monitored several times a day.  Remember, if your scan is normal, we will scan you annually as long as you continue to meet the criteria for the program. (Age 55-77, Current smoker or smoker who has quit within the last 15 years). If you are a smoker, remember, quitting is the single most powerful action that you can take to decrease your risk of lung cancer and other pulmonary, breathing related problems. We know quitting is hard, and we are here to help.  Please let us know if there is anything we can do to help you meet your goal of quitting. If you are a former smoker, congratulations. We are proud of you! Remain smoke free! Remember you can refer friends or family members through the number above.  We will screen them to make sure they meet criteria for the program. Thank you for helping us take better care of you by  participating in Lung Screening.  You can receive free nicotine replacement therapy ( patches, gum or mints) by calling 1-800-QUIT NOW. Please call so we can get you on the path to becoming  a non-smoker. I know it is hard, but you can do this!  Lung RADS Categories:  Lung RADS 1: no nodules or definitely non-concerning nodules.  Recommendation is for a repeat annual scan in 12 months.  Lung RADS 2:  nodules that are non-concerning in appearance and behavior with a very low likelihood of becoming an active cancer. Recommendation is for a repeat annual scan in 12 months.  Lung RADS 3: nodules that are probably non-concerning , includes nodules with a low likelihood of becoming an active cancer.  Recommendation is for a 6-month repeat screening scan. Often noted after an upper respiratory illness. We will be in touch to make sure you have no questions, and to schedule your 6-month scan.  Lung RADS 4 A: nodules with concerning findings, recommendation is most often for a follow up scan in 3 months or additional testing based on our provider's assessment of the scan. We will be in touch to make sure you have no questions and to schedule the recommended 3 month follow up scan.  Lung RADS 4 B:  indicates findings that are concerning. We will be in touch with you to schedule additional diagnostic testing based on our provider's  assessment of the scan.  Other options for assistance in smoking cessation (   As covered by your insurance benefits)  Hypnosis for smoking cessation  [employer] [phone]  Acupuncture for smoking cessation  [facility] [phone]   

## 2021-07-04 NOTE — Progress Notes (Signed)
Virtual Visit via Telephone Note  I connected with [name] on 07/04/21 at 10:00 AM EDT by telephone and verified that I am speaking with the correct person using two identifiers.  Location: Patient:  At home Provider:  [place] [address], [place], [place], [address]    I discussed the limitations, risks, security and privacy concerns of performing an evaluation and management service by telephone and the availability of in person appointments. I also discussed with the patient that there may be a patient responsible charge related to this service. The patient expressed understanding and agreed to proceed.   Shared Decision Making Visit Lung Cancer Screening Program [id number])   Eligibility: Age 61 y.o. Pack Years Smoking History Calculation 46 pack year smoking history (# packs/per year x # years smoked) Recent History of coughing up blood  no Unexplained weight loss? no ( >Than 15 pounds within the last 6 months ) Prior History Lung / other cancer no (Diagnosis within the last 5 years already requiring surveillance chest CT Scans). Smoking Status Current Smoker Former Smokers: Years since quit:  NA  Quit Date:  NA  Visit Components: Discussion included one or more decision making aids. yes Discussion included risk/benefits of screening. yes Discussion included potential follow up diagnostic testing for abnormal scans. yes Discussion included meaning and risk of over diagnosis. yes Discussion included meaning and risk of False Positives. yes Discussion included meaning of total radiation exposure. yes  Counseling Included: Importance of adherence to annual lung cancer LDCT screening. yes Impact of comorbidities on ability to participate in the program. yes Ability and willingness to under diagnostic treatment. yes  Smoking Cessation Counseling: Current Smokers:  Discussed importance of smoking cessation. yes Information about tobacco cessation classes and  interventions provided to patient. yes Patient provided with "ticket" for LDCT Scan. no Symptomatic Patient. no  Counseling NA Diagnosis Code: Tobacco Use Z72.0 Asymptomatic Patient yes  Counseling (Intermediate counseling: > three minutes counseling) [id number] Former Smokers:  Discussed the importance of maintaining cigarette abstinence. yes Diagnosis Code: Personal History of Nicotine Dependence. N36.144 Information about tobacco cessation classes and interventions provided to patient. Yes Patient provided with "ticket" for LDCT Scan. yes Written Order for Lung Cancer Screening with LDCT placed in Epic. Yes (CT Chest Lung Cancer Screening Low Dose W/O CM) [id number] Z12.2-Screening of respiratory organs Z87.891-Personal history of nicotine dependence  I have spent 25 minutes of face to face/ virtual visit   time with  [name] discussing the risks and benefits of lung cancer screening. We viewed / discussed a power point together that explained in detail the above noted topics. We paused at intervals to allow for questions to be asked and answered to ensure understanding.We discussed that the single most powerful action that she can take to decrease her risk of developing lung cancer is to quit smoking. We discussed whether or not she is ready to commit to setting a quit date. We discussed options for tools to aid in quitting smoking including nicotine replacement therapy, non-nicotine medications, support groups, Quit Smart classes, and behavior modification. We discussed that often times setting smaller, more achievable goals, such as eliminating 1 cigarette a day for a week and then 2 cigarettes a day for a week can be helpful in slowly decreasing the number of cigarettes smoked. This allows for a sense of accomplishment as well as providing a clinical benefit. I provided  her  with smoking cessation  information  with contact information for community resources, classes, free  nicotine  replacement therapy, and access to mobile apps, text messaging, and on-line smoking cessation help. I have also provided  her  the office contact information in the event she needs to contact me, or the screening staff. We discussed the time and location of the scan, and that either Doroteo Glassman RN, Joella Prince, RN  or I will call / send a letter with the results within 24-72 hours of receiving them. The patient verbalized understanding of all of  the above and had no further questions upon leaving the office. They have my contact information in the event they have any further questions.  I spent 4 minutes counseling on smoking cessation and the health risks of continued tobacco abuse.  I explained to the patient that there has been a high incidence of coronary artery disease noted on these exams. I explained that this is a non-gated exam therefore degree or severity cannot be determined. This patient is on statin therapy. I have asked the patient to follow-up with their PCP regarding any incidental finding of coronary artery disease and management with diet or medication as their PCP  feels is clinically indicated. The patient verbalized understanding of the above and had no further questions upon completion of the visit.     Magdalen Spatz, NP 07/04/2021

## 2021-07-08 ENCOUNTER — Ambulatory Visit
Admission: RE | Admit: 2021-07-08 | Discharge: 2021-07-08 | Disposition: A | Discharge status: Home / Self Care | Payer: Medicare Other | Source: Ambulatory Visit | Attending: Internal Medicine | Admitting: Internal Medicine

## 2021-07-08 DIAGNOSIS — F17210 Nicotine dependence, cigarettes, uncomplicated: Secondary | ICD-10-CM

## 2021-07-08 DIAGNOSIS — J8410 Pulmonary fibrosis, unspecified: Secondary | ICD-10-CM | POA: Diagnosis not present

## 2021-07-08 DIAGNOSIS — J432 Centrilobular emphysema: Secondary | ICD-10-CM | POA: Diagnosis not present

## 2021-07-08 DIAGNOSIS — Z87891 Personal history of nicotine dependence: Secondary | ICD-10-CM

## 2021-07-08 DIAGNOSIS — Z122 Encounter for screening for malignant neoplasm of respiratory organs: Secondary | ICD-10-CM

## 2021-07-08 DIAGNOSIS — I2510 Atherosclerotic heart disease of native coronary artery without angina pectoris: Secondary | ICD-10-CM | POA: Diagnosis not present

## 2021-07-10 ENCOUNTER — Other Ambulatory Visit: Payer: Self-pay

## 2021-07-10 DIAGNOSIS — G894 Chronic pain syndrome: Secondary | ICD-10-CM | POA: Diagnosis not present

## 2021-07-10 DIAGNOSIS — G8921 Chronic pain due to trauma: Secondary | ICD-10-CM | POA: Diagnosis not present

## 2021-07-10 DIAGNOSIS — Z87891 Personal history of nicotine dependence: Secondary | ICD-10-CM

## 2021-07-10 DIAGNOSIS — F17210 Nicotine dependence, cigarettes, uncomplicated: Secondary | ICD-10-CM

## 2021-07-10 DIAGNOSIS — M5030 Other cervical disc degeneration, unspecified cervical region: Secondary | ICD-10-CM | POA: Diagnosis not present

## 2021-07-10 DIAGNOSIS — M5412 Radiculopathy, cervical region: Secondary | ICD-10-CM | POA: Diagnosis not present

## 2021-07-10 DIAGNOSIS — Z122 Encounter for screening for malignant neoplasm of respiratory organs: Secondary | ICD-10-CM

## 2021-07-10 NOTE — Telephone Encounter (Signed)
Re-faxed clearance letter to Dr. Oliver Barre office.

## 2021-07-12 ENCOUNTER — Encounter: Payer: Self-pay | Admitting: Cardiology

## 2021-07-12 NOTE — Telephone Encounter (Signed)
Letter is sent.   Rex Kras, [place], [facility]  Pager: [phone] Office: [phone]

## 2021-07-15 ENCOUNTER — Other Ambulatory Visit: Payer: Self-pay | Admitting: Internal Medicine

## 2021-07-15 ENCOUNTER — Ambulatory Visit (INDEPENDENT_AMBULATORY_CARE_PROVIDER_SITE_OTHER): Payer: Medicare Other | Admitting: Internal Medicine

## 2021-07-15 ENCOUNTER — Encounter: Payer: Self-pay | Admitting: Internal Medicine

## 2021-07-15 VITALS — BP 132/68 | HR 64 | Temp 98.6°F | Ht 65.0 in | Wt 137.0 lb

## 2021-07-15 DIAGNOSIS — Z0001 Encounter for general adult medical examination with abnormal findings: Secondary | ICD-10-CM | POA: Diagnosis not present

## 2021-07-15 DIAGNOSIS — E119 Type 2 diabetes mellitus without complications: Secondary | ICD-10-CM | POA: Diagnosis not present

## 2021-07-15 DIAGNOSIS — E538 Deficiency of other specified B group vitamins: Secondary | ICD-10-CM

## 2021-07-15 DIAGNOSIS — E782 Mixed hyperlipidemia: Secondary | ICD-10-CM | POA: Diagnosis not present

## 2021-07-15 DIAGNOSIS — E559 Vitamin D deficiency, unspecified: Secondary | ICD-10-CM | POA: Diagnosis not present

## 2021-07-15 DIAGNOSIS — F322 Major depressive disorder, single episode, severe without psychotic features: Secondary | ICD-10-CM

## 2021-07-15 DIAGNOSIS — I10 Essential (primary) hypertension: Secondary | ICD-10-CM | POA: Diagnosis not present

## 2021-07-15 DIAGNOSIS — F17200 Nicotine dependence, unspecified, uncomplicated: Secondary | ICD-10-CM | POA: Diagnosis not present

## 2021-07-15 LAB — CBC WITH DIFFERENTIAL/PLATELET
Basophils Absolute: 0 10*3/uL (ref 0.0–0.1)
Basophils Relative: 1 % (ref 0.0–3.0)
Eosinophils Absolute: 0.1 10*3/uL (ref 0.0–0.7)
Eosinophils Relative: 2 % (ref 0.0–5.0)
HCT: 36.8 % (ref 36.0–46.0)
Hemoglobin: 12.4 g/dL (ref 12.0–15.0)
Lymphocytes Relative: 47.7 % — ABNORMAL HIGH (ref 12.0–46.0)
Lymphs Abs: 2.2 10*3/uL (ref 0.7–4.0)
MCHC: 33.6 g/dL (ref 30.0–36.0)
MCV: 90.8 fl (ref 78.0–100.0)
Monocytes Absolute: 0.3 10*3/uL (ref 0.1–1.0)
Monocytes Relative: 5.7 % (ref 3.0–12.0)
Neutro Abs: 2 10*3/uL (ref 1.4–7.7)
Neutrophils Relative %: 43.6 % (ref 43.0–77.0)
Platelets: 241 10*3/uL (ref 150.0–400.0)
RBC: 4.05 Mil/uL (ref 3.87–5.11)
RDW: 14.1 % (ref 11.5–15.5)
WBC: 4.5 10*3/uL (ref 4.0–10.5)

## 2021-07-15 LAB — URINALYSIS, ROUTINE W REFLEX MICROSCOPIC
Bilirubin Urine: NEGATIVE
Hgb urine dipstick: NEGATIVE
Ketones, ur: NEGATIVE
Nitrite: NEGATIVE
RBC / HPF: NONE SEEN (ref 0–?)
Specific Gravity, Urine: 1.01 (ref 1.000–1.030)
Total Protein, Urine: NEGATIVE
Urine Glucose: NEGATIVE
Urobilinogen, UA: 0.2 (ref 0.0–1.0)
pH: 6 (ref 5.0–8.0)

## 2021-07-15 LAB — LIPID PANEL
Cholesterol: 197 mg/dL (ref 0–200)
HDL: 52.9 mg/dL (ref >39.00)
LDL Cholesterol: 116 mg/dL — ABNORMAL HIGH (ref 0–99)
NonHDL: 144.4
Total CHOL/HDL Ratio: 4
Triglycerides: 142 mg/dL (ref 0.0–149.0)
VLDL: 28.4 mg/dL (ref 0.0–40.0)

## 2021-07-15 LAB — HEPATIC FUNCTION PANEL
ALT: 16 U/L (ref 0–35)
AST: 22 U/L (ref 0–37)
Albumin: 4.5 g/dL (ref 3.5–5.2)
Alkaline Phosphatase: 115 U/L (ref 39–117)
Bilirubin, Direct: 0.1 mg/dL (ref 0.0–0.3)
Total Bilirubin: 0.5 mg/dL (ref 0.2–1.2)
Total Protein: 7.5 g/dL (ref 6.0–8.3)

## 2021-07-15 LAB — BASIC METABOLIC PANEL
BUN: 9 mg/dL (ref 6–23)
CO2: 27 mEq/L (ref 19–32)
Calcium: 10.1 mg/dL (ref 8.4–10.5)
Chloride: 104 mEq/L (ref 96–112)
Creatinine, Ser: 0.96 mg/dL (ref 0.40–1.20)
GFR: 65.24 mL/min (ref >60.00)
Glucose, Bld: 113 mg/dL — ABNORMAL HIGH (ref 70–99)
Potassium: 3.4 mEq/L — ABNORMAL LOW (ref 3.5–5.1)
Sodium: 139 mEq/L (ref 135–145)

## 2021-07-15 LAB — VITAMIN B12: Vitamin B-12: 557 pg/mL (ref 211–911)

## 2021-07-15 LAB — VITAMIN D 25 HYDROXY (VIT D DEFICIENCY, FRACTURES): VITD: 28.07 ng/mL — ABNORMAL LOW (ref 30.00–100.00)

## 2021-07-15 LAB — MICROALBUMIN / CREATININE URINE RATIO
Creatinine,U: 82.5 mg/dL
Microalb Creat Ratio: 0.8 mg/g (ref 0.0–30.0)
Microalb, Ur: <0.7 mg/dL (ref 0.0–1.9)

## 2021-07-15 LAB — TSH: TSH: 0.75 u[IU]/mL (ref 0.35–5.50)

## 2021-07-15 LAB — HEMOGLOBIN A1C: Hgb A1c MFr Bld: 6.6 % — ABNORMAL HIGH (ref 4.6–6.5)

## 2021-07-15 NOTE — Patient Instructions (Signed)
Please take OTC Vitamin D3 at 2000 units per day, indefinitely as we discussed  Please continue all other medications as before, and refills have been done if requested.  Please have the pharmacy call with any other refills you may need.  Please continue your efforts at being more active, low cholesterol diet, and weight control.  You are otherwise up to date with prevention measures today.  Please keep your appointments with your specialists as you may have planned  Please go to the LAB at the blood drawing area for the tests to be done  You will be contacted by phone if any changes need to be made immediately.  Otherwise, you will receive a letter about your results with an explanation, but please check with MyChart first.  Please remember to sign up for MyChart if you have not done so, as this will be important to you in the future with finding out test results, communicating by private email, and scheduling acute appointments online when needed.  Please make an Appointment to return in 6 months, or sooner if needed

## 2021-07-15 NOTE — Assessment & Plan Note (Signed)
Last vitamin D Lab Results  Component Value Date   VD25OH 31.57 01/14/2021   Low to increase oral replacement

## 2021-07-15 NOTE — Assessment & Plan Note (Signed)
BP Readings from Last 3 Encounters:  07/15/21 132/68  07/02/21 (!) 142/82  04/02/21 126/76   Stable, pt to continue medical treatment norvasc, bidil, losartan 100, toprol 100 qd, dyazide

## 2021-07-15 NOTE — Assessment & Plan Note (Signed)
Stable, denies SI or HI, cont tx zoloft 100
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
## 2021-07-15 NOTE — Assessment & Plan Note (Signed)
Pt counsled to quit, pt not ready °

## 2021-07-15 NOTE — Assessment & Plan Note (Signed)
Age and sex appropriate education and counseling updated with regular exercise and diet Referrals for preventative services - has appts for pap and mammogram, also scheduled for colonoscopy July 2023 Immunizations addressed - declines shingrix Smoking counseling  - counseled to quit, pt not ready Evidence for depression or other mood disorder - chronic anxiety depression  - stable per pt, declines need for change in tx or referral Most recent labs reviewed. I have personally reviewed and have noted: 1) the patient's medical and social history 2) The patient's current medications and supplements 3) The patient's height, weight, and BMI have been recorded in the chart

## 2021-07-15 NOTE — Assessment & Plan Note (Signed)
Lab Results  Component Value Date   LDLCALC 139 (H) 01/14/2021   Uncontrolled, goal ldl < 70, pt to continue current statin lipitor 80 and f/u lab

## 2021-07-15 NOTE — Assessment & Plan Note (Signed)
Lab Results  Component Value Date   HGBA1C 6.6 (H) 07/15/2021   Stable, pt to continue current medical treatment metformin 500 qd

## 2021-07-15 NOTE — Telephone Encounter (Signed)
See letter tab, per Cardiology patient can hold Plavix 5 days prior to her procedure. Patient is to remain on aspirin per Dr. Fuller Plan. Left message for patient to return my call.

## 2021-07-16 NOTE — Telephone Encounter (Signed)
Informed patient she can hold Plavix 5 days prior to her procedure and continue aspirin up until her procedure. Patient verbalized understanding.

## 2021-07-17 DIAGNOSIS — M25512 Pain in left shoulder: Secondary | ICD-10-CM | POA: Diagnosis not present

## 2021-07-23 ENCOUNTER — Ambulatory Visit
Admission: RE | Admit: 2021-07-23 | Discharge: 2021-07-23 | Disposition: A | Discharge status: Home / Self Care | Payer: Medicare Other | Source: Ambulatory Visit | Attending: Internal Medicine | Admitting: Internal Medicine

## 2021-07-23 DIAGNOSIS — Z1239 Encounter for other screening for malignant neoplasm of breast: Secondary | ICD-10-CM

## 2021-07-23 DIAGNOSIS — Z1231 Encounter for screening mammogram for malignant neoplasm of breast: Secondary | ICD-10-CM | POA: Diagnosis not present

## 2021-07-24 ENCOUNTER — Encounter: Payer: Self-pay | Admitting: Radiology

## 2021-07-24 ENCOUNTER — Ambulatory Visit (INDEPENDENT_AMBULATORY_CARE_PROVIDER_SITE_OTHER): Payer: Medicare Other | Admitting: Radiology

## 2021-07-24 ENCOUNTER — Other Ambulatory Visit (HOSPITAL_COMMUNITY)
Admission: RE | Admit: 2021-07-24 | Discharge: 2021-07-24 | Disposition: A | Discharge status: Home / Self Care | Payer: Medicare Other | Source: Ambulatory Visit | Attending: Radiology | Admitting: Radiology

## 2021-07-24 VITALS — BP 134/80 | Ht 65.0 in | Wt 139.0 lb

## 2021-07-24 DIAGNOSIS — Z9189 Other specified personal risk factors, not elsewhere classified: Secondary | ICD-10-CM | POA: Insufficient documentation

## 2021-07-24 DIAGNOSIS — Z1151 Encounter for screening for human papillomavirus (HPV): Secondary | ICD-10-CM | POA: Insufficient documentation

## 2021-07-24 DIAGNOSIS — Z01419 Encounter for gynecological examination (general) (routine) without abnormal findings: Secondary | ICD-10-CM | POA: Diagnosis present

## 2021-07-24 DIAGNOSIS — N958 Other specified menopausal and perimenopausal disorders: Secondary | ICD-10-CM

## 2021-07-24 DIAGNOSIS — Z8619 Personal history of other infectious and parasitic diseases: Secondary | ICD-10-CM

## 2021-07-24 DIAGNOSIS — Z124 Encounter for screening for malignant neoplasm of cervix: Secondary | ICD-10-CM

## 2021-07-24 DIAGNOSIS — Z113 Encounter for screening for infections with a predominantly sexual mode of transmission: Secondary | ICD-10-CM | POA: Diagnosis not present

## 2021-07-24 MED ORDER — ESTRADIOL 0.1 MG/GM VA CREA: 1.0000 | TOPICAL_CREAM | Freq: Every day | VAGINAL | 12 refills | Status: DC

## 2021-07-24 MED ORDER — ESTRADIOL 0.1 MG/GM VA CREA: 1.0000 g | TOPICAL_CREAM | VAGINAL | 12 refills | Status: DC

## 2021-07-24 NOTE — Progress Notes (Signed)
[name] 12/24/1960 [id number]   History: Postmenopausal 61 y.o. presents for annual exam. Has a new sexual partner would like STI screen with pap. She complains of vaginal pain with intercourse. Reports she is lubricated enough. No other gyn concerns. Sees PCP regularly.   Gynecologic History Postmenopausal Last Pap: 2019. Results were: normal, history of abnormals in the past Last mammogram: 07/23/21. Results were: normal Last colonoscopy: scheduled 7/23, last done 7/19 HRT use: in the past  Obstetric History OB History  Gravida Para Term Preterm AB Living  '2 2       2  '$ SAB IAB Ectopic Multiple Live Births               # Outcome Date GA Lbr Len/2nd Weight Sex Delivery Anes PTL Lv  2 Para           1 Para              The following portions of the patient's history were reviewed and updated as appropriate: allergies, current medications, past family history, past medical history, past social history, past surgical history, and problem list.  Review of Systems Pertinent items noted in HPI and remainder of comprehensive ROS otherwise negative.  Past medical history, past surgical history, family history and social history were all reviewed and documented in the EPIC chart.  Exam:  Vitals:   07/24/21 1020  BP: 134/80  Weight: 139 lb (63 kg)  Height: '5\' 5"'$  (1.651 m)   Body mass index is 23.13 kg/m.  General appearance:  Normal Thyroid:  Symmetrical, normal in size, without palpable masses or nodularity. Respiratory  Auscultation:  Clear without wheezing or rhonchi Cardiovascular  Auscultation:  Regular rate, without rubs, murmurs or gallops  Edema/varicosities:  Not grossly evident Abdominal  Soft,nontender, without masses, guarding or rebound.  Liver/spleen:  No organomegaly noted  Hernia:  None appreciated  Skin  Inspection:  Grossly normal Breasts: Examined lying and sitting.   Right: Without masses, retractions, nipple discharge or axillary  adenopathy.   Left: Without masses, retractions, nipple discharge or axillary adenopathy. Genitourinary   Inguinal/mons:  Normal without inguinal adenopathy  External genitalia:  Normal appearing vulva with no masses, tenderness, or lesions  BUS/Urethra/Skene's glands:  Normal  Vagina:  Normal appearing with normal color and discharge, no lesions. Atrophy: moderate   Cervix:  Normal appearing without discharge or lesions  Uterus:  Normal in size, shape and contour.  Midline and mobile, nontender  Adnexa/parametria:     Rt: Normal in size, without masses or tenderness.   Lt: Normal in size, without masses or tenderness.  Anus and perineum: Normal    Patient informed chaperone available to be present for breast and pelvic exam. Patient has requested no chaperone to be present. Patient has been advised what will be completed during breast and pelvic exam.   Assessment/Plan:   1. Encounter for Papanicolaou smear of cervix in high-risk patient with prior abnormal result  - Cytology - PAP( [place])  2. Screening for STDs (sexually transmitted diseases) Done with pap, new sexual partner  3. Genitourinary syndrome of menopause  - estradiol (ESTRACE VAGINAL) 0.1 MG/GM vaginal cream; Place 1 Applicatorful vaginally at bedtime.  Dispense: 42.5 g; Refill: 12  4. Other specified personal risk factors, not elsewhere classified   Discussed SBE, colonoscopy and DEXA screening as directed. Recommend 110mns of exercise weekly, including weight bearing exercise. Encouraged the use of seatbelts and sunscreen.  Return in 1 year for annual or sooner  prn.  Dasha Kawabata B WHNP-BC, 10:50 AM 07/24/2021

## 2021-07-28 LAB — CYTOLOGY - PAP
Adequacy: ABSENT
Chlamydia: NEGATIVE
Comment: NEGATIVE
Comment: NEGATIVE
Comment: NEGATIVE
Comment: NORMAL
Diagnosis: UNDETERMINED — AB
High risk HPV: NEGATIVE
Neisseria Gonorrhea: NEGATIVE
Trichomonas: POSITIVE — AB

## 2021-07-29 ENCOUNTER — Other Ambulatory Visit: Payer: Self-pay

## 2021-07-29 DIAGNOSIS — M75102 Unspecified rotator cuff tear or rupture of left shoulder, not specified as traumatic: Secondary | ICD-10-CM | POA: Diagnosis not present

## 2021-07-29 DIAGNOSIS — M7542 Impingement syndrome of left shoulder: Secondary | ICD-10-CM | POA: Diagnosis not present

## 2021-07-29 MED ORDER — METRONIDAZOLE 500 MG PO TABS: 500.0000 mg | ORAL_TABLET | Freq: Two times a day (BID) | ORAL | 0 refills | Status: DC

## 2021-07-29 NOTE — Progress Notes (Signed)
Repeat 1 year

## 2021-08-01 ENCOUNTER — Other Ambulatory Visit: Payer: Self-pay | Admitting: Internal Medicine

## 2021-08-01 DIAGNOSIS — F419 Anxiety disorder, unspecified: Secondary | ICD-10-CM

## 2021-08-01 DIAGNOSIS — G629 Polyneuropathy, unspecified: Secondary | ICD-10-CM

## 2021-08-01 DIAGNOSIS — I10 Essential (primary) hypertension: Secondary | ICD-10-CM

## 2021-08-01 DIAGNOSIS — E119 Type 2 diabetes mellitus without complications: Secondary | ICD-10-CM

## 2021-08-01 DIAGNOSIS — I209 Angina pectoris, unspecified: Secondary | ICD-10-CM

## 2021-08-01 NOTE — Telephone Encounter (Signed)
Pt called in and stated that her pharmacy has changed and she needs her medications sent to her new pharmacy.   atorvastatin (LIPITOR) 80 MG tablet  buPROPion (WELLBUTRIN) 75 MG tablet  albuterol (VENTOLIN HFA) 108 (90 Base) MCG/ACT inhaler  ondansetron (ZOFRAN) injection  losartan (COZAAR) 100 MG tablet  gabapentin (NEURONTIN) 300 MG capsule  diazepam (VALIUM) 5 MG tablet  metFORMIN (GLUCOPHAGE) 500 MG tablet  sertraline (ZOLOFT) 100 MG tablet  metoprolol succinate (TOPROL-XL) 100 MG 24 hr tablet  Pharmacy:  [facility], [address]. Phone:  [phone]  Fax:  [phone]

## 2021-08-02 DIAGNOSIS — M7542 Impingement syndrome of left shoulder: Secondary | ICD-10-CM | POA: Insufficient documentation

## 2021-08-04 MED ORDER — ALBUTEROL SULFATE HFA 108 (90 BASE) MCG/ACT IN AERS: 2.0000 | INHALATION_SPRAY | Freq: Four times a day (QID) | RESPIRATORY_TRACT | 5 refills | Status: DC | PRN

## 2021-08-04 MED ORDER — ONDANSETRON HCL 4 MG PO TABS
4.0000 mg | ORAL_TABLET | Freq: Three times a day (TID) | ORAL | 1 refills | Status: DC | PRN
Start: 2021-08-04 — End: 2022-06-26

## 2021-08-04 MED ORDER — SERTRALINE HCL 100 MG PO TABS: 100.0000 mg | ORAL_TABLET | Freq: Every day | ORAL | 3 refills | Status: DC

## 2021-08-04 MED ORDER — ATORVASTATIN CALCIUM 80 MG PO TABS: 80.0000 mg | ORAL_TABLET | Freq: Every day | ORAL | 3 refills | Status: DC

## 2021-08-04 MED ORDER — LOSARTAN POTASSIUM 100 MG PO TABS: 100.0000 mg | ORAL_TABLET | Freq: Every day | ORAL | 2 refills | Status: DC

## 2021-08-04 MED ORDER — METFORMIN HCL 500 MG PO TABS: 500.0000 mg | ORAL_TABLET | Freq: Every day | ORAL | 3 refills | Status: DC

## 2021-08-04 MED ORDER — GABAPENTIN 300 MG PO CAPS: 300.0000 mg | ORAL_CAPSULE | Freq: Three times a day (TID) | ORAL | 1 refills | Status: DC

## 2021-08-04 MED ORDER — BUPROPION HCL 75 MG PO TABS: 75.0000 mg | ORAL_TABLET | Freq: Two times a day (BID) | ORAL | 3 refills | Status: DC

## 2021-08-04 MED ORDER — DIAZEPAM 5 MG PO TABS
ORAL_TABLET | ORAL | 2 refills | Status: DC
Start: 2021-08-04 — End: 2021-11-30

## 2021-08-04 MED ORDER — METOPROLOL SUCCINATE ER 100 MG PO TB24: ORAL_TABLET | ORAL | 0 refills | Status: DC

## 2021-08-04 MED ORDER — AMLODIPINE BESYLATE 10 MG PO TABS: 10.0000 mg | ORAL_TABLET | Freq: Every day | ORAL | 3 refills | Status: DC

## 2021-08-04 NOTE — Telephone Encounter (Signed)
Please send pended medications to new pharmacy

## 2021-08-26 ENCOUNTER — Encounter: Payer: Self-pay | Admitting: Certified Registered Nurse Anesthetist

## 2021-08-26 ENCOUNTER — Telehealth: Payer: Self-pay

## 2021-08-26 NOTE — Telephone Encounter (Signed)
Pt called and stated that Dr. Veverly Fells wants to operate on her left shoulder for a rotator cuff repair again. The orthopedic called her PCP and her PCP wants her to stop the plavix for 5 days. The surgery is scheduled for 09/30/2021. She want to know if her PCP is correct. Please advise.

## 2021-08-27 ENCOUNTER — Encounter: Payer: Self-pay | Admitting: Gastroenterology

## 2021-08-27 ENCOUNTER — Ambulatory Visit (AMBULATORY_SURGERY_CENTER): Payer: Medicare Other | Admitting: Gastroenterology

## 2021-08-27 VITALS — BP 146/82 | HR 95 | Temp 98.0°F | Resp 20 | Ht 65.0 in | Wt 139.0 lb

## 2021-08-27 DIAGNOSIS — D120 Benign neoplasm of cecum: Secondary | ICD-10-CM | POA: Diagnosis not present

## 2021-08-27 DIAGNOSIS — D125 Benign neoplasm of sigmoid colon: Secondary | ICD-10-CM

## 2021-08-27 DIAGNOSIS — K635 Polyp of colon: Secondary | ICD-10-CM | POA: Diagnosis not present

## 2021-08-27 DIAGNOSIS — Z86010 Personal history of colonic polyps: Secondary | ICD-10-CM

## 2021-08-27 DIAGNOSIS — Z09 Encounter for follow-up examination after completed treatment for conditions other than malignant neoplasm: Secondary | ICD-10-CM

## 2021-08-27 MED ORDER — SODIUM CHLORIDE 0.9 % IV SOLN: 500.0000 mL | Freq: Once | INTRAVENOUS | Status: DC

## 2021-08-27 NOTE — Telephone Encounter (Signed)
Yes, she can hold Plavix for 5 days if that's what the surgeon is recommending.   Please ask the surgeon when to restart after surgery - as per wound healing.   Dr. Terri Skains

## 2021-08-27 NOTE — Progress Notes (Signed)
Called to room to assist during endoscopic procedure.  Patient ID and intended procedure confirmed with present staff. Received instructions for my participation in the procedure from the performing physician.  
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
## 2021-08-27 NOTE — Progress Notes (Signed)
Report given to PACU, vss 

## 2021-08-27 NOTE — Patient Instructions (Signed)
**  Handouts given on polyps and diverticulosis**  YOU HAD AN ENDOSCOPIC PROCEDURE TODAY AT [facility]:   Refer to the procedure report that was given to you for any specific questions about what was found during the examination.  If the procedure report does not answer your questions, please call your gastroenterologist to clarify.  If you requested that your care partner not be given the details of your procedure findings, then the procedure report has been included in a sealed envelope for you to review at your convenience later.  YOU SHOULD EXPECT: Some feelings of bloating in the abdomen. Passage of more gas than usual.  Walking can help get rid of the air that was put into your GI tract during the procedure and reduce the bloating. If you had a lower endoscopy (such as a colonoscopy or flexible sigmoidoscopy) you may notice spotting of blood in your stool or on the toilet paper. If you underwent a bowel prep for your procedure, you may not have a normal bowel movement for a few days.  Please Note:  You might notice some irritation and congestion in your nose or some drainage.  This is from the oxygen used during your procedure.  There is no need for concern and it should clear up in a day or so.  SYMPTOMS TO REPORT IMMEDIATELY:  Following lower endoscopy (colonoscopy or flexible sigmoidoscopy):  Excessive amounts of blood in the stool  Significant tenderness or worsening of abdominal pains  Swelling of the abdomen that is new, acute  Fever of 100F or higher   For urgent or emergent issues, a gastroenterologist can be reached at any hour by calling [phone]. Do not use MyChart messaging for urgent concerns.    DIET:  We do recommend a small meal at first, but then you may proceed to your regular diet.  Drink plenty of fluids but you should avoid alcoholic beverages for 24 hours.  ACTIVITY:  You should plan to take it easy for the rest of today and you should NOT  DRIVE or use heavy machinery until tomorrow (because of the sedation medicines used during the test).    FOLLOW UP: Our staff will call the number listed on your records the next business day following your procedure.  We will call around 7:15- 8:00 am to check on you and address any questions or concerns that you may have regarding the information given to you following your procedure. If we do not reach you, we will leave a message.  If you develop any symptoms (ie: fever, flu-like symptoms, shortness of breath, cough etc.) before then, please call [phone].  If you test positive for Covid 19 in the 2 weeks post procedure, please call and report this information to [place].    If any biopsies were taken you will be contacted by phone or by letter within the next 1-3 weeks.  Please call us at [phone] if you have not heard about the biopsies in 3 weeks.    SIGNATURES/CONFIDENTIALITY: You and/or your care partner have signed paperwork which will be entered into your electronic medical record.  These signatures attest to the fact that that the information above on your After Visit Summary has been reviewed and is understood.  Full responsibility of the confidentiality of this discharge information lies with you and/or your care-partner.

## 2021-08-27 NOTE — Op Note (Addendum)
[facility] Patient Name: [name] Procedure Date: 08/27/2021 1:31 PM MRN: [id number] Endoscopist: Ladene Artist , MD Age: 61 Referring MD:  Date of Birth: May 24, 1960 Gender: Female Account #: [id number] Procedure:                Colonoscopy Indications:              Surveillance: Personal history of adenomatous                            polyps on last colonoscopy > 3 years ago Medicines:                Monitored Anesthesia Care Procedure:                Pre-Anesthesia Assessment:                           - Prior to the procedure, a History and Physical                            was performed, and patient medications and                            allergies were reviewed. The patient's tolerance of                            previous anesthesia was also reviewed. The risks                            and benefits of the procedure and the sedation                            options and risks were discussed with the patient.                            All questions were answered, and informed consent                            was obtained. Prior Anticoagulants: The patient has                            taken Plavix (clopidogrel), last dose was 5 days                            prior to procedure. ASA Grade Assessment: III - A                            patient with severe systemic disease. After                            reviewing the risks and benefits, the patient was                            deemed in satisfactory condition to undergo the  procedure.                           After obtaining informed consent, the colonoscope                            was passed under direct vision. Throughout the                            procedure, the patient's blood pressure, pulse, and                            oxygen saturations were monitored continuously. The                            PCF-HQ190L Colonoscope was introduced through the                             anus and advanced to the the cecum, identified by                            appendiceal orifice and ileocecal valve. The                            ileocecal valve, appendiceal orifice, and rectum                            were photographed. The quality of the bowel                            preparation was good. The colonoscopy was performed                            without difficulty. The patient tolerated the                            procedure well. Scope In: 1:35:59 PM Scope Out: 1:56:55 PM Scope Withdrawal Time: 0 hours 17 minutes 56 seconds  Total Procedure Duration: 0 hours 20 minutes 56 seconds  Findings:                 The perianal and digital rectal examinations were                            normal.                           A 3 mm polyp was found in the ileocecal valve. The                            polyp was sessile. The polyp was removed with a                            cold biopsy forceps. Resection and retrieval were  complete.                           Four sessile polyps were found in the sigmoid                            colon. The polyps were 5 to 7 mm in size. These                            polyps were removed with a cold snare. Resection                            and retrieval were complete.                           A few small-mouthed diverticula were found in the                            entire colon. There was no evidence of diverticular                            bleeding.                           The exam was otherwise without abnormality on                            direct and retroflexion views. Complications:            No immediate complications. Estimated blood loss:                            None. Estimated Blood Loss:     Estimated blood loss: none. Impression:               - One 3 mm polyp at the ileocecal valve, removed                            with a cold biopsy forceps.  Resected and retrieved.                           - Four 5 to 7 mm polyps in the sigmoid colon,                            removed with a cold snare. Resected and retrieved.                           - Mild diverticulosis in the entire examined colon.                           - The examination was otherwise normal on direct                            and retroflexion views. Recommendation:           -  Repeat colonoscopy after studies are complete for                            surveillance based on pathology results.                           - Patient has a contact number available for                            emergencies. The signs and symptoms of potential                            delayed complications were discussed with the                            patient. Return to normal activities tomorrow.                            Written discharge instructions were provided to the                            patient.                           - Resume previous diet.                           - Continue present medications.                           - Await pathology results.                           - Resume Plavix (clopidogrel) at prior dose in 2                            days. Refer to managing physician for further                            adjustment of therapy. Ladene Artist, MD 08/27/2021 2:00:23 PM This report has been signed electronically.

## 2021-08-28 ENCOUNTER — Other Ambulatory Visit: Payer: Self-pay | Admitting: Radiology

## 2021-08-28 ENCOUNTER — Telehealth: Payer: Self-pay

## 2021-08-28 MED ORDER — PROGESTERONE MICRONIZED 100 MG PO CAPS: 100.0000 mg | ORAL_CAPSULE | Freq: Every day | ORAL | 1 refills | Status: DC

## 2021-08-28 MED ORDER — ESTRADIOL 0.05 MG/24HR TD PTTW: 1.0000 | MEDICATED_PATCH | TRANSDERMAL | 1 refills | Status: DC

## 2021-08-28 NOTE — Telephone Encounter (Signed)
Patient called.  She had visit on 07/24/21 and forgot to ask then about getting back on HRT.  Complaining of hotflashes. Previously on Estradiol 0.5 mg daily and Prometrium 100 mg qhs.  She asked if she has to return for a visit to get Rx.

## 2021-08-28 NOTE — Telephone Encounter (Signed)
Patient is fine with trying the Vivelle Patch/Prometrium. Pharmacy confirmed.  Rx's are attached for you to approve. Thanks

## 2021-08-28 NOTE — Telephone Encounter (Signed)
Called and spoke to pt, pt voiced understanding.

## 2021-08-28 NOTE — Telephone Encounter (Signed)
We can restart but it would be the estrogen patches, I don't prescribe the pills because of the risks. If she is open to using the vivelle patch twice a week with the prometrium at HS we can send it over.

## 2021-08-28 NOTE — Telephone Encounter (Signed)
Pharmacy sent this Rx with note that "Pharmacy comment: NDC not covered by insurance. insurance prefers vaginal cream."  I called the [employer]. Pharmacist reviewed the message from insurance co when they ran the Rx.  Ins co will only cover Estrogen Vaginal Cream.  Patient can pay out of pocket #24 = $186.00 and with a Good Rx coupon $86.   I spoke with patient and informed her. She wants to consider. She will let us know if she decides not to take HRT so we can remove from her med list

## 2021-09-10 ENCOUNTER — Ambulatory Visit (INDEPENDENT_AMBULATORY_CARE_PROVIDER_SITE_OTHER): Payer: Medicare Other | Admitting: Radiology

## 2021-09-10 VITALS — BP 120/68

## 2021-09-10 DIAGNOSIS — A599 Trichomoniasis, unspecified: Secondary | ICD-10-CM

## 2021-09-10 DIAGNOSIS — Z79890 Hormone replacement therapy (postmenopausal): Secondary | ICD-10-CM | POA: Diagnosis not present

## 2021-09-10 DIAGNOSIS — B3731 Acute candidiasis of vulva and vagina: Secondary | ICD-10-CM | POA: Diagnosis not present

## 2021-09-10 LAB — WET PREP FOR TRICH, YEAST, CLUE

## 2021-09-10 MED ORDER — FLUCONAZOLE 150 MG PO TABS: 150.0000 mg | ORAL_TABLET | Freq: Once | ORAL | 0 refills | Status: AC

## 2021-09-10 NOTE — Progress Notes (Signed)
      Subjective: [name] is a 61 y.o. female here for TOC trich. Partner was also treated. Started estrogen patch and prometrium  2 weeks ago for vasomotor sx. Has already seen some improvement.  Review of Systems  All other systems reviewed and are negative.    Objective:  -Vulva: without lesions or discharge -Vagina: discharge present, aptima swab and wet prep obtained -Cervix: no lesion or discharge, no CMT -Perineum: no lesions -Uterus: Mobile, non tender -Adnexa: no masses or tenderness   Microscopic wet-mount exam shows hyphae.   Chaperone offered and declined.  Assessment:/Plan:  1. Trichomonas infection Reassured trich has resolved - WET PREP FOR TRICH, YEAST, CLUE  2. Yeast vaginitis  - fluconazole (DIFLUCAN) 150 MG tablet; Take 1 tablet (150 mg total) by mouth once for 1 dose.  Dispense: 1 tablet; Refill: 0   3. Hormone replacement therapy (HRT) Smoking cessation encouraged. Discussed risks of blood clots with patient   . Avoid intercourse until symptoms are resolved. Safe sex encouraged. Avoid the use of soaps or perfumed products in the peri area. Avoid tub baths and sitting in sweaty or wet clothing for prolonged periods of time.

## 2021-09-11 ENCOUNTER — Encounter: Payer: Self-pay | Admitting: Gastroenterology

## 2021-09-15 ENCOUNTER — Ambulatory Visit: Payer: Medicare Other | Admitting: Cardiology

## 2021-09-15 ENCOUNTER — Encounter: Payer: Self-pay | Admitting: Cardiology

## 2021-09-15 VITALS — BP 104/64 | HR 51 | Temp 97.5°F | Resp 16 | Ht 65.0 in | Wt 139.6 lb

## 2021-09-15 DIAGNOSIS — E782 Mixed hyperlipidemia: Secondary | ICD-10-CM

## 2021-09-15 DIAGNOSIS — E119 Type 2 diabetes mellitus without complications: Secondary | ICD-10-CM | POA: Diagnosis not present

## 2021-09-15 DIAGNOSIS — I2510 Atherosclerotic heart disease of native coronary artery without angina pectoris: Secondary | ICD-10-CM

## 2021-09-15 DIAGNOSIS — I10 Essential (primary) hypertension: Secondary | ICD-10-CM | POA: Diagnosis not present

## 2021-09-15 DIAGNOSIS — F17200 Nicotine dependence, unspecified, uncomplicated: Secondary | ICD-10-CM

## 2021-09-15 DIAGNOSIS — I2584 Coronary atherosclerosis due to calcified coronary lesion: Secondary | ICD-10-CM | POA: Diagnosis not present

## 2021-09-15 DIAGNOSIS — IMO0001 Reserved for inherently not codable concepts without codable children: Secondary | ICD-10-CM
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
## 2021-09-17 ENCOUNTER — Telehealth: Payer: Self-pay | Admitting: Cardiology

## 2021-09-17 ENCOUNTER — Other Ambulatory Visit: Payer: Self-pay

## 2021-09-17 DIAGNOSIS — I2510 Atherosclerotic heart disease of native coronary artery without angina pectoris: Secondary | ICD-10-CM

## 2021-09-17 MED ORDER — ISOSORB DINITRATE-HYDRALAZINE 20-37.5 MG PO TABS: 1.0000 | ORAL_TABLET | Freq: Three times a day (TID) | ORAL | 0 refills | Status: DC

## 2021-09-17 MED ORDER — CLOPIDOGREL BISULFATE 75 MG PO TABS: 75.0000 mg | ORAL_TABLET | Freq: Every day | ORAL | 0 refills | Status: DC

## 2021-09-17 NOTE — Progress Notes (Signed)
Sent message, via epic in basket, requesting orders in epic from surgeon.  
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
## 2021-09-17 NOTE — Telephone Encounter (Signed)
Medications have been refilled ?

## 2021-09-22 DIAGNOSIS — L910 Hypertrophic scar: Secondary | ICD-10-CM | POA: Diagnosis not present

## 2021-09-22 DIAGNOSIS — M792 Neuralgia and neuritis, unspecified: Secondary | ICD-10-CM | POA: Diagnosis not present

## 2021-09-22 DIAGNOSIS — T310 Burns involving less than 10% of body surface: Secondary | ICD-10-CM | POA: Diagnosis not present

## 2021-09-22 DIAGNOSIS — T22291D Burn of second degree of multiple sites of right shoulder and upper limb, except wrist and hand, subsequent encounter: Secondary | ICD-10-CM | POA: Diagnosis not present

## 2021-09-22 DIAGNOSIS — T24311D Burn of third degree of right thigh, subsequent encounter: Secondary | ICD-10-CM | POA: Diagnosis not present

## 2021-09-22 DIAGNOSIS — T24211D Burn of second degree of right thigh, subsequent encounter: Secondary | ICD-10-CM | POA: Diagnosis not present

## 2021-09-23 NOTE — Progress Notes (Addendum)
Anesthesia Review:  PCP: DR  Cathlean Cower LOV 07/15/21 Clearance on chart dated 08/12/21.  Cardiologist : DR Terri Skains  09/15/21- LOV  Called and requested clearance on pt from [facility].  [facility].   Chest x-ray : EKG : 09/15/21  Echo : Stress test: Cardiac Cath :  Activity level: can do a flight of stairs without difficutly  Sleep Study/ CPAP : none Fasting Blood Sugar :      / Checks Blood Sugar -- times a day:   Blood Thinner/ Instructions /Last Dose: ASA / Instructions/ Last Dose :   91 mg aspirin Plavix  - stop 7 days prior per pt  DM- type 2- checks glucose once daily in the pm per pt  Hgba1c-09/24/21-  5.9  AT 1520 pm on 09/24/21 glucose was 68 at preop .  Pt stated " I feel a little tired".  PT given apple juice and 6 pack of peanut butter crackers.  " PT stated she ate a late breakfast today at 1000 am approximately.  AT 1539 recheck of glucose after completing crackers and apple juice was 70.  PT states " I feel better now".  PT voices no complaints.  PT states she is going to prepare cheeseburgers and fries for dinner. PT did not want any more food at preop.  Pricilla Riffle made aware of above.  No new orders given.  PT discharged to home.  PT states she has a driver home following preop.   Patient asked at preop if she could take any pain meds or med for muscle spasm if needed am of surgery.  PT informed that she could take either Nucynta or Robaxin or Baclofen if needed.  PT instructed only to take one of those.  PT voiced understanding.

## 2021-09-23 NOTE — Patient Instructions (Signed)
SURGICAL WAITING ROOM VISITATION Patients having surgery or a procedure may have no more than 2 support people in the waiting area - these visitors may rotate.   Children under the age of 32 must have an adult with them who is not the patient. If the patient needs to stay at the hospital during part of their recovery, the visitor guidelines for inpatient rooms apply. Pre-op nurse will coordinate an appropriate time for 1 support person to accompany patient in pre-op.  This support person may not rotate.    Please refer to the [facility] website for the visitor guidelines for Inpatients (after your surgery is over and you are in a regular room).       Your procedure is scheduled on:  10/03/2021    Report to [facility] Main Entrance    Report to admitting at  1245 pm    Call this number if you have problems the morning of surgery [phone]   Do not eat food :After Midnight.   After Midnight you may have the following liquids until ___ 1145___ AM  DAY OF SURGERY  Water Non-Citrus Juices (without pulp, NO RED) Carbonated Beverages Black Coffee (NO MILK/CREAM OR CREAMERS, sugar ok)  Clear Tea (NO MILK/CREAM OR CREAMERS, sugar ok) regular and decaf                             Plain Jell-O (NO RED)                                           Fruit ices (not with fruit pulp, NO RED)                                     Popsicles (NO RED)                                                               Sports drinks like Gatorade (NO RED)                     The day of surgery:  Drink ONE (1) Pre-Surgery Clear Ensure or G2 at  1145 AM ( have completed by )  the morning of surgery. Drink in one sitting. Do not sip.  This drink was given to you during your hospital  pre-op appointment visit. Nothing else to drink after completing the  Pre-Surgery Clear Ensure or G2.          Oral Hygiene is also important to reduce your risk of infection.                                     Remember - BRUSH YOUR TEETH THE MORNING OF SURGERY WITH YOUR REGULAR TOOTHPASTE   Do NOT smoke after Midnight   Take these medicines the morning of surgery with A SIP OF WATER:  inhalers as usual and bring, amlodipione, wellbutrin, nexium, Bidil, Toprol, zoloft   DO NOT TAKE ANY ORAL  DIABETIC MEDICATIONS DAY OF YOUR SURGERY  Bring CPAP mask and tubing day of surgery.                              You may not have any metal on your body including hair pins, jewelry, and body piercing             Do not wear make-up, lotions, powders, perfumes/cologne, or deodorant  Do not wear nail polish including gel and S&S, artificial/acrylic nails, or any other type of covering on natural nails including finger and toenails. If you have artificial nails, gel coating, etc. that needs to be removed by a nail salon please have this removed prior to surgery or surgery may need to be canceled/ delayed if the surgeon/ anesthesia feels like they are unable to be safely monitored.   Do not shave  48 hours prior to surgery.               Men may shave face and neck.   Do not bring valuables to the hospital. [place].   Contacts, dentures or bridgework may not be worn into surgery.   Bring small overnight bag day of surgery.   DO NOT [place]. PHARMACY WILL DISPENSE MEDICATIONS LISTED ON YOUR MEDICATION LIST TO YOU DURING YOUR ADMISSION [place]!    Patients discharged on the day of surgery will not be allowed to drive home.  Someone NEEDS to stay with you for the first 24 hours after anesthesia.   Special Instructions: Bring a copy of your healthcare power of attorney and living will documents         the day of surgery if you haven't scanned them before.              Please read over the following fact sheets you were given: IF YOU HAVE [place] [phone]

## 2021-09-24 ENCOUNTER — Encounter (HOSPITAL_COMMUNITY): Payer: Self-pay

## 2021-09-24 ENCOUNTER — Other Ambulatory Visit: Payer: Self-pay

## 2021-09-24 ENCOUNTER — Encounter (HOSPITAL_COMMUNITY)
Admission: RE | Admit: 2021-09-24 | Discharge: 2021-09-24 | Disposition: A | Discharge status: Home / Self Care | Payer: Medicare Other | Source: Ambulatory Visit | Attending: Orthopedic Surgery | Admitting: Orthopedic Surgery

## 2021-09-24 VITALS — BP 121/70 | HR 64 | Temp 98.2°F | Resp 16 | Ht 65.0 in | Wt 137.0 lb

## 2021-09-24 DIAGNOSIS — M75102 Unspecified rotator cuff tear or rupture of left shoulder, not specified as traumatic: Secondary | ICD-10-CM | POA: Insufficient documentation

## 2021-09-24 DIAGNOSIS — Z01818 Encounter for other preprocedural examination: Secondary | ICD-10-CM | POA: Diagnosis not present

## 2021-09-24 DIAGNOSIS — Z7984 Long term (current) use of oral hypoglycemic drugs: Secondary | ICD-10-CM | POA: Diagnosis not present

## 2021-09-24 DIAGNOSIS — J449 Chronic obstructive pulmonary disease, unspecified: Secondary | ICD-10-CM | POA: Diagnosis not present

## 2021-09-24 DIAGNOSIS — F17210 Nicotine dependence, cigarettes, uncomplicated: Secondary | ICD-10-CM | POA: Diagnosis not present

## 2021-09-24 DIAGNOSIS — I10 Essential (primary) hypertension: Secondary | ICD-10-CM | POA: Insufficient documentation

## 2021-09-24 DIAGNOSIS — I2510 Atherosclerotic heart disease of native coronary artery without angina pectoris: Secondary | ICD-10-CM | POA: Insufficient documentation

## 2021-09-24 DIAGNOSIS — E1140 Type 2 diabetes mellitus with diabetic neuropathy, unspecified: Secondary | ICD-10-CM | POA: Diagnosis not present

## 2021-09-24 LAB — BASIC METABOLIC PANEL
Anion gap: 5 (ref 5–15)
BUN: 16 mg/dL (ref 6–20)
CO2: 22 mmol/L (ref 22–32)
Calcium: 10 mg/dL (ref 8.9–10.3)
Chloride: 111 mmol/L (ref 98–111)
Creatinine, Ser: 1.24 mg/dL — ABNORMAL HIGH (ref 0.44–1.00)
GFR, Estimated: 50 mL/min — ABNORMAL LOW (ref >60)
Glucose, Bld: 93 mg/dL (ref 70–99)
Potassium: 4.3 mmol/L (ref 3.5–5.1)
Sodium: 138 mmol/L (ref 135–145)

## 2021-09-24 LAB — CBC
HCT: 39.5 % (ref 36.0–46.0)
Hemoglobin: 13.1 g/dL (ref 12.0–15.0)
MCH: 30.5 pg (ref 26.0–34.0)
MCHC: 33.2 g/dL (ref 30.0–36.0)
MCV: 92.1 fL (ref 80.0–100.0)
Platelets: 247 10*3/uL (ref 150–400)
RBC: 4.29 MIL/uL (ref 3.87–5.11)
RDW: 14.7 % (ref 11.5–15.5)
WBC: 5.6 10*3/uL (ref 4.0–10.5)
nRBC: 0 % (ref 0.0–0.2)

## 2021-09-24 LAB — GLUCOSE, CAPILLARY
Glucose-Capillary: 68 mg/dL — ABNORMAL LOW (ref 70–99)
Glucose-Capillary: 70 mg/dL (ref 70–99)

## 2021-09-24 LAB — HEMOGLOBIN A1C
Hgb A1c MFr Bld: 5.9 % — ABNORMAL HIGH (ref 4.8–5.6)
Mean Plasma Glucose: 122.63 mg/dL

## 2021-09-25 NOTE — Anesthesia Preprocedure Evaluation (Addendum)
Anesthesia Evaluation  Patient identified by MRN, date of birth, ID band Patient awake    Reviewed: reviewed documented beta blocker date and time   History of Anesthesia Complications (+) PONV and history of anesthetic complications  Airway Mallampati: II  TM Distance: >3 FB Neck ROM: Full    Dental  (+) Missing,    Pulmonary COPD,  COPD inhaler, Current SmokerPatient did not abstain from smoking.,    Pulmonary exam normal        Cardiovascular hypertension, Pt. on medications and Pt. on home beta blockers + CAD  Normal cardiovascular exam     Neuro/Psych  Headaches, Anxiety Depression    GI/Hepatic GERD  Controlled,  Endo/Other  diabetes, Type 2, Oral Hypoglycemic Agents  Renal/GU Renal InsufficiencyRenal disease     Musculoskeletal  (+) Arthritis ,   Abdominal   Peds  Hematology   Anesthesia Other Findings   Reproductive/Obstetrics                           Anesthesia Physical Anesthesia Plan  ASA: 3  Anesthesia Plan: General   Post-op Pain Management: Tylenol PO (pre-op)* and Ketamine IV*   Induction: Intravenous  PONV Risk Score and Plan: 2 and Treatment may vary due to age or medical condition, Ondansetron, Dexamethasone and Midazolam  Airway Management Planned: Oral ETT  Additional Equipment: None  Intra-op Plan:   Post-operative Plan: Extubation in OR  Informed Consent: I have reviewed the patients History and Physical, chart, labs and discussed the procedure including the risks, benefits and alternatives for the proposed anesthesia with the patient or authorized representative who has indicated his/her understanding and acceptance.     Dental advisory given  Plan Discussed with: CRNA  Anesthesia Plan Comments: (Discussion with patient in preop regarding ISB. Patient is very concerned about small possibility of phrenic nerve paralysis and feeling SOB after  surgery. She would prefer to avoid ISB. Dr. Veverly Fells will inject local to surgical site. Daiva Huge, MD)      Anesthesia Quick Evaluation
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
## 2021-09-27 ENCOUNTER — Other Ambulatory Visit: Payer: Self-pay | Admitting: Cardiology

## 2021-09-27 DIAGNOSIS — I2510 Atherosclerotic heart disease of native coronary artery without angina pectoris: Secondary | ICD-10-CM

## 2021-10-03 ENCOUNTER — Ambulatory Visit (HOSPITAL_BASED_OUTPATIENT_CLINIC_OR_DEPARTMENT_OTHER): Payer: Medicare Other | Admitting: Anesthesiology

## 2021-10-03 ENCOUNTER — Encounter (HOSPITAL_COMMUNITY)
Admission: RE | Disposition: A | Discharge status: Home / Self Care | Payer: Self-pay | Source: Home / Self Care | Attending: Orthopedic Surgery

## 2021-10-03 ENCOUNTER — Encounter (HOSPITAL_COMMUNITY): Payer: Self-pay | Admitting: Orthopedic Surgery

## 2021-10-03 ENCOUNTER — Ambulatory Visit (HOSPITAL_COMMUNITY): Payer: Medicare Other | Admitting: Emergency Medicine

## 2021-10-03 ENCOUNTER — Other Ambulatory Visit: Payer: Self-pay

## 2021-10-03 ENCOUNTER — Ambulatory Visit (HOSPITAL_COMMUNITY)
Admission: RE | Admit: 2021-10-03 | Discharge: 2021-10-03 | Disposition: A | Discharge status: Home / Self Care | Payer: Medicare Other | Attending: Orthopedic Surgery | Admitting: Orthopedic Surgery

## 2021-10-03 DIAGNOSIS — F17210 Nicotine dependence, cigarettes, uncomplicated: Secondary | ICD-10-CM

## 2021-10-03 DIAGNOSIS — S43432A Superior glenoid labrum lesion of left shoulder, initial encounter: Secondary | ICD-10-CM

## 2021-10-03 DIAGNOSIS — X58XXXA Exposure to other specified factors, initial encounter: Secondary | ICD-10-CM | POA: Insufficient documentation

## 2021-10-03 DIAGNOSIS — E1140 Type 2 diabetes mellitus with diabetic neuropathy, unspecified: Secondary | ICD-10-CM | POA: Insufficient documentation

## 2021-10-03 DIAGNOSIS — I10 Essential (primary) hypertension: Secondary | ICD-10-CM | POA: Diagnosis not present

## 2021-10-03 DIAGNOSIS — K219 Gastro-esophageal reflux disease without esophagitis: Secondary | ICD-10-CM | POA: Insufficient documentation

## 2021-10-03 DIAGNOSIS — M75102 Unspecified rotator cuff tear or rupture of left shoulder, not specified as traumatic: Secondary | ICD-10-CM

## 2021-10-03 DIAGNOSIS — J449 Chronic obstructive pulmonary disease, unspecified: Secondary | ICD-10-CM | POA: Insufficient documentation

## 2021-10-03 DIAGNOSIS — Z7984 Long term (current) use of oral hypoglycemic drugs: Secondary | ICD-10-CM | POA: Diagnosis not present

## 2021-10-03 DIAGNOSIS — M75122 Complete rotator cuff tear or rupture of left shoulder, not specified as traumatic: Secondary | ICD-10-CM | POA: Diagnosis not present

## 2021-10-03 DIAGNOSIS — Z01818 Encounter for other preprocedural examination: Secondary | ICD-10-CM

## 2021-10-03 DIAGNOSIS — I2510 Atherosclerotic heart disease of native coronary artery without angina pectoris: Secondary | ICD-10-CM | POA: Insufficient documentation

## 2021-10-03 DIAGNOSIS — F418 Other specified anxiety disorders: Secondary | ICD-10-CM | POA: Diagnosis not present

## 2021-10-03 DIAGNOSIS — S46012A Strain of muscle(s) and tendon(s) of the rotator cuff of left shoulder, initial encounter: Secondary | ICD-10-CM | POA: Diagnosis not present

## 2021-10-03 HISTORY — PX: SHOULDER ARTHROSCOPY WITH ROTATOR CUFF REPAIR AND SUBACROMIAL DECOMPRESSION: SHX5686

## 2021-10-03 LAB — GLUCOSE, CAPILLARY
Glucose-Capillary: 99 mg/dL (ref 70–99)
Glucose-Capillary: 102 mg/dL — ABNORMAL HIGH (ref 70–99)

## 2021-10-03 SURGERY — SHOULDER ARTHROSCOPY WITH ROTATOR CUFF REPAIR AND SUBACROMIAL DECOMPRESSION
Anesthesia: General | Laterality: Left

## 2021-10-03 MED ORDER — HYDROMORPHONE HCL 1 MG/ML IJ SOLN
0.2500 mg | INTRAMUSCULAR | Status: DC | PRN
  Administered 2021-10-03 (×2): 0.25 mg via INTRAVENOUS

## 2021-10-03 MED ORDER — BUPIVACAINE-EPINEPHRINE 0.5% -1:200000 IJ SOLN
INTRAMUSCULAR | Status: DC | PRN
  Administered 2021-10-03: 10 mL

## 2021-10-03 MED ORDER — KETAMINE HCL 10 MG/ML IJ SOLN
INTRAMUSCULAR | Status: DC | PRN
  Administered 2021-10-03: 30 mg via INTRAVENOUS
  Administered 2021-10-03: 10 mg via INTRAVENOUS

## 2021-10-03 MED ORDER — ACETAMINOPHEN 10 MG/ML IV SOLN
INTRAVENOUS | Status: AC
  Filled 2021-10-03: qty 100

## 2021-10-03 MED ORDER — LIDOCAINE 2% (20 MG/ML) 5 ML SYRINGE
INTRAMUSCULAR | Status: DC | PRN
  Administered 2021-10-03: 60 mg via INTRAVENOUS

## 2021-10-03 MED ORDER — PHENYLEPHRINE HCL (PRESSORS) 10 MG/ML IV SOLN
INTRAVENOUS | Status: AC
  Filled 2021-10-03: qty 1

## 2021-10-03 MED ORDER — 0.9 % SODIUM CHLORIDE (POUR BTL) OPTIME
TOPICAL | Status: DC | PRN
  Administered 2021-10-03: 1000 mL

## 2021-10-03 MED ORDER — PHENYLEPHRINE 80 MCG/ML (10ML) SYRINGE FOR IV PUSH (FOR BLOOD PRESSURE SUPPORT)
PREFILLED_SYRINGE | INTRAVENOUS | Status: DC | PRN
  Administered 2021-10-03: 160 ug via INTRAVENOUS

## 2021-10-03 MED ORDER — AMISULPRIDE (ANTIEMETIC) 5 MG/2ML IV SOLN: 10.0000 mg | Freq: Once | INTRAVENOUS | Status: AC | PRN

## 2021-10-03 MED ORDER — ROCURONIUM BROMIDE 10 MG/ML (PF) SYRINGE
PREFILLED_SYRINGE | INTRAVENOUS | Status: DC | PRN
  Administered 2021-10-03: 60 mg via INTRAVENOUS

## 2021-10-03 MED ORDER — AMISULPRIDE (ANTIEMETIC) 5 MG/2ML IV SOLN
INTRAVENOUS | Status: AC
  Administered 2021-10-03: 10 mg via INTRAVENOUS
  Filled 2021-10-03: qty 4

## 2021-10-03 MED ORDER — LABETALOL HCL 5 MG/ML IV SOLN
INTRAVENOUS | Status: AC
  Administered 2021-10-03: 5 mg via INTRAVENOUS
  Filled 2021-10-03: qty 4

## 2021-10-03 MED ORDER — PHENYLEPHRINE HCL-NACL 20-0.9 MG/250ML-% IV SOLN
INTRAVENOUS | Status: DC | PRN
  Administered 2021-10-03: 30 ug/min via INTRAVENOUS

## 2021-10-03 MED ORDER — SUGAMMADEX SODIUM 200 MG/2ML IV SOLN
INTRAVENOUS | Status: DC | PRN
  Administered 2021-10-03: 150 mg via INTRAVENOUS

## 2021-10-03 MED ORDER — FENTANYL CITRATE PF 50 MCG/ML IJ SOSY
50.0000 ug | PREFILLED_SYRINGE | INTRAMUSCULAR | Status: DC
  Filled 2021-10-03: qty 2

## 2021-10-03 MED ORDER — OXYCODONE HCL 5 MG PO TABS: 5.0000 mg | ORAL_TABLET | Freq: Once | ORAL | Status: AC | PRN

## 2021-10-03 MED ORDER — ACETAMINOPHEN 500 MG PO TABS: 1000.0000 mg | ORAL_TABLET | Freq: Once | ORAL | Status: DC

## 2021-10-03 MED ORDER — HYDROMORPHONE HCL 1 MG/ML IJ SOLN
INTRAMUSCULAR | Status: AC
  Administered 2021-10-03: 0.25 mg via INTRAVENOUS
  Filled 2021-10-03: qty 1

## 2021-10-03 MED ORDER — ORAL CARE MOUTH RINSE: 15.0000 mL | Freq: Once | OROMUCOSAL | Status: AC

## 2021-10-03 MED ORDER — ACETAMINOPHEN 10 MG/ML IV SOLN
INTRAVENOUS | Status: DC | PRN
  Administered 2021-10-03: 1000 mg via INTRAVENOUS

## 2021-10-03 MED ORDER — PROPOFOL 10 MG/ML IV BOLUS
INTRAVENOUS | Status: DC | PRN
  Administered 2021-10-03: 120 mg via INTRAVENOUS

## 2021-10-03 MED ORDER — LACTATED RINGERS IV SOLN: INTRAVENOUS | Status: DC

## 2021-10-03 MED ORDER — FENTANYL CITRATE (PF) 250 MCG/5ML IJ SOLN
INTRAMUSCULAR | Status: AC
  Filled 2021-10-03: qty 5

## 2021-10-03 MED ORDER — HYDROMORPHONE HCL 1 MG/ML IJ SOLN
INTRAMUSCULAR | Status: AC
  Administered 2021-10-03: 0.5 mg via INTRAVENOUS
  Filled 2021-10-03: qty 2

## 2021-10-03 MED ORDER — KETAMINE HCL 10 MG/ML IJ SOLN
INTRAMUSCULAR | Status: AC
  Filled 2021-10-03: qty 1

## 2021-10-03 MED ORDER — CHLORHEXIDINE GLUCONATE 0.12 % MT SOLN
15.0000 mL | Freq: Once | OROMUCOSAL | Status: AC
  Administered 2021-10-03: 15 mL via OROMUCOSAL

## 2021-10-03 MED ORDER — OXYCODONE HCL 5 MG PO TABS
ORAL_TABLET | ORAL | Status: AC
  Administered 2021-10-03: 5 mg via ORAL
  Filled 2021-10-03: qty 1

## 2021-10-03 MED ORDER — CEFAZOLIN SODIUM-DEXTROSE 2-4 GM/100ML-% IV SOLN
2.0000 g | INTRAVENOUS | Status: AC
  Administered 2021-10-03: 2 g via INTRAVENOUS
  Filled 2021-10-03: qty 100

## 2021-10-03 MED ORDER — BUPIVACAINE-EPINEPHRINE 0.5% -1:200000 IJ SOLN
INTRAMUSCULAR | Status: AC
Start: 2021-10-03 — End: ?
  Filled 2021-10-03: qty 1

## 2021-10-03 MED ORDER — FENTANYL CITRATE (PF) 100 MCG/2ML IJ SOLN
INTRAMUSCULAR | Status: DC | PRN
  Administered 2021-10-03: 100 ug via INTRAVENOUS
  Administered 2021-10-03 (×3): 50 ug via INTRAVENOUS

## 2021-10-03 MED ORDER — LABETALOL HCL 5 MG/ML IV SOLN: 5.0000 mg | Freq: Once | INTRAVENOUS | Status: AC | PRN

## 2021-10-03 MED ORDER — HYDROMORPHONE HCL 1 MG/ML IJ SOLN
0.2500 mg | INTRAMUSCULAR | Status: DC | PRN
  Administered 2021-10-03 (×3): 0.5 mg via INTRAVENOUS

## 2021-10-03 MED ORDER — SODIUM CHLORIDE 0.9 % IR SOLN
Status: DC | PRN
  Administered 2021-10-03 (×2): 3000 mL

## 2021-10-03 MED ORDER — PROPOFOL 10 MG/ML IV BOLUS
INTRAVENOUS | Status: AC
  Filled 2021-10-03: qty 20

## 2021-10-03 MED ORDER — MIDAZOLAM HCL 2 MG/2ML IJ SOLN
1.0000 mg | INTRAMUSCULAR | Status: DC
  Filled 2021-10-03: qty 2

## 2021-10-03 SURGICAL SUPPLY — 56 items
ABDOMINAL PAD ABD IMPLANT
ANCHOR ALL SUT RC W2 1.3 RIB (Anchor) ×1 IMPLANT
ANCHOR ALL-SUT FLEX 1.3 Y-KNOT (Anchor) IMPLANT
ANCHOR ALL-SUT RC W2 1.3 RIB (Anchor) IMPLANT
BAG COUNTER SPONGE SURGICOUNT (BAG) IMPLANT
BIT DRILL YKNOT HARD BONE 1.3 (BIT) IMPLANT
BLADE MIC 41X13 (BLADE) ×1 IMPLANT
BLADE SURG SZ11 CARB STEEL (BLADE) ×1 IMPLANT
BOOTIES KNEE HIGH SLOAN (MISCELLANEOUS) ×2 IMPLANT
BURR OVAL 8 FLU 4.0X13 (MISCELLANEOUS) IMPLANT
CLSR STERI-STRIP ANTIMIC 1/2X4 (GAUZE/BANDAGES/DRESSINGS) IMPLANT
COVER SURGICAL LIGHT HANDLE (MISCELLANEOUS) ×1 IMPLANT
CUTTER BONE 4.0MM X 13CM (MISCELLANEOUS) ×1 IMPLANT
DRAPE ARTHROSCOPY W/POUCH 114 (DRAPES) ×1 IMPLANT
DRAPE ORTHO SPLIT 77X108 STRL (DRAPES) ×2
DRAPE STERI 35X30 U-POUCH (DRAPES) ×1 IMPLANT
DRAPE SURG ORHT 6 SPLT 77X108 (DRAPES) ×2 IMPLANT
DRAPE U-SHAPE 47X51 STRL (DRAPES) ×1 IMPLANT
DRSG ADAPTIC 3X8 NADH LF (GAUZE/BANDAGES/DRESSINGS) IMPLANT
DRSG EMULSION OIL 3X3 NADH (GAUZE/BANDAGES/DRESSINGS) ×1 IMPLANT
DRSG PAD ABDOMINAL 8X10 ST (GAUZE/BANDAGES/DRESSINGS) ×1 IMPLANT
DURAPREP 26ML APPLICATOR (WOUND CARE) ×1 IMPLANT
ELECT REM PT RETURN 15FT ADLT (MISCELLANEOUS) ×1 IMPLANT
GAUZE SPONGE 4X4 12PLY STRL (GAUZE/BANDAGES/DRESSINGS) IMPLANT
GLOVE BIOGEL PI IND STRL 7.5 (GLOVE) ×1 IMPLANT
GLOVE BIOGEL PI IND STRL 8.5 (GLOVE) ×1 IMPLANT
GLOVE BIOGEL PI INDICATOR 7.5 (GLOVE) ×1
GLOVE BIOGEL PI INDICATOR 8.5 (GLOVE) ×1
GLOVE ORTHO TXT STRL SZ7.5 (GLOVE) ×1 IMPLANT
GLOVE SURG ORTHO 8.5 STRL (GLOVE) ×1 IMPLANT
GOWN STRL REUS W/ TWL XL LVL3 (GOWN DISPOSABLE) ×2 IMPLANT
GOWN STRL REUS W/TWL XL LVL3 (GOWN DISPOSABLE) ×2
KIT BASIN OR (CUSTOM PROCEDURE TRAY) ×1 IMPLANT
KIT TURNOVER KIT A (KITS) IMPLANT
MANIFOLD NEPTUNE II (INSTRUMENTS) ×1 IMPLANT
NDL MAYO 6 CRC TAPER PT (NEEDLE) IMPLANT
NDL MAYO CATGUT SZ4 TPR NDL (NEEDLE) IMPLANT
NDL SPNL 18GX3.5 QUINCKE PK (NEEDLE) ×1 IMPLANT
NEEDLE MAYO 6 CRC TAPER PT (NEEDLE) IMPLANT
NEEDLE MAYO CATGUT SZ4 (NEEDLE) IMPLANT
NEEDLE SPNL 18GX3.5 QUINCKE PK (NEEDLE) ×1 IMPLANT
PACK SHOULDER (CUSTOM PROCEDURE TRAY) ×1 IMPLANT
PORT APPOLLO RF 90DEGREE MULTI (SURGICAL WAND) ×1 IMPLANT
PROTECTOR NERVE ULNAR (MISCELLANEOUS) ×1 IMPLANT
RESTRAINT HEAD UNIVERSAL NS (MISCELLANEOUS) IMPLANT
SLING ARM IMMOBILIZER LRG (SOFTGOODS) IMPLANT
SLING ARM IMMOBILIZER MED (SOFTGOODS) IMPLANT
SPIKE FLUID TRANSFER (MISCELLANEOUS) ×1 IMPLANT
SUT ETHILON 4 0 PS 2 18 (SUTURE) ×1 IMPLANT
SUT HI-FI 2 STRAND C-2 40 (SUTURE) IMPLANT
SUT VIC AB 0 CT1 36 (SUTURE) ×1 IMPLANT
SUT VIC AB 0 CT2 27 (SUTURE) ×1 IMPLANT
TAPE CLOTH SURG 6X10 WHT LF (GAUZE/BANDAGES/DRESSINGS) IMPLANT
TOWEL OR 17X26 10 PK STRL BLUE (TOWEL DISPOSABLE) ×2 IMPLANT
TUBING ARTHROSCOPY IRRIG 16FT (MISCELLANEOUS) ×1 IMPLANT
TUBING CONNECTING 10 (TUBING) ×1 IMPLANT

## 2021-10-03 NOTE — Discharge Instructions (Signed)
Ice to the shoulder constantly.  Keep the incision covered and clean and dry for one week, then ok to get it wet in the shower.  Do exercise as instructed several times per day.  Do Pendulums, gentle lap slides and gentle rotation exercises  DO NOT reach behind your back or push up out of a chair with the operative arm.  Use a sling while you are up and around for comfort, may remove while seated.  Keep pillow propped behind the operative elbow.  Follow up with Dr Veverly Fells in two weeks in the office, call [phone] for appt

## 2021-10-03 NOTE — Anesthesia Postprocedure Evaluation (Signed)
Anesthesia Post Note  Patient: [name]  Procedure(s) Performed: SHOULDER ARTHROSCOPY WITH mini open ROTATOR CUFF REPAIR AND SUBACROMIAL DECOMPRESSION (Left)     Patient location during evaluation: PACU Anesthesia Type: General Level of consciousness: awake and alert Pain management: pain level controlled Vital Signs Assessment: post-procedure vital signs reviewed and stable Respiratory status: spontaneous breathing, nonlabored ventilation and respiratory function stable Cardiovascular status: blood pressure returned to baseline Postop Assessment: no apparent nausea or vomiting Anesthetic complications: no   No notable events documented.  Last Vitals:  Vitals:   10/03/21 1645 10/03/21 1700  BP: (!) 184/95 (!) 172/97  Pulse: 81 63  Resp: 15 12  Temp:    SpO2: 95% 97%    Last Pain:  Vitals:   10/03/21 1700  TempSrc:   PainSc: Asleep                 Marthenia Rolling

## 2021-10-03 NOTE — Anesthesia Procedure Notes (Signed)
Procedure Name: Intubation Date/Time: 10/03/2021 1:56 PM  Performed by: Gean Maidens, CRNAPre-anesthesia Checklist: Patient identified, Emergency Drugs available, Suction available, Patient being monitored and Timeout performed Patient Re-evaluated:Patient Re-evaluated prior to induction Oxygen Delivery Method: Circle system utilized Preoxygenation: Pre-oxygenation with 100% oxygen Induction Type: IV induction Ventilation: Mask ventilation without difficulty Laryngoscope Size: Mac and 3 Grade View: Grade II Tube type: Oral Tube size: 7.0 mm Number of attempts: 1 Airway Equipment and Method: Stylet Placement Confirmation: ETT inserted through vocal cords under direct vision, positive ETCO2 and breath sounds checked- equal and bilateral Secured at: 21 cm Tube secured with: Tape Dental Injury: Teeth and Oropharynx as per pre-operative assessment

## 2021-10-03 NOTE — Op Note (Unsigned)
[name], [name] MEDICAL RECORD NO: [id number] ACCOUNT NO: [id number] DATE OF BIRTH: December 01, 1960 FACILITY: [facility] LOCATION: [facility] PHYSICIAN: Doran Heater. Veverly Fells, MD  Operative Report   DATE OF PROCEDURE: 10/03/2021   PREOPERATIVE DIAGNOSIS:  Left shoulder rotator cuff tear.  POSTOPERATIVE DIAGNOSES:   1.  Left shoulder rotator cuff tear. 2.  SLAP tear, left shoulder.  PROCEDURE PERFORMED:  Left shoulder arthroscopy with extensive intraarticular debridement of torn superior labrum anterior to posterior with arthroscopic biceps tenotomy, followed by arthroscopic subacromial decompression with mini open rotator cuff  repair and mini open biceps tenodesis in the groove.  ATTENDING SURGEON:  Doran Heater. Veverly Fells, MD  ASSISTANT:  Darol Destine, MD, PA-C, who was scrubbed during the entire procedure, and necessary for satisfactory completion of surgery.  ANESTHESIA:  General anesthesia was used plus interscalene block.  ESTIMATED BLOOD LOSS:  Minimal.  FLUID REPLACEMENT:  1200 mL crystalloid.  INSTRUMENT COUNTS:  Correct.  COMPLICATIONS:  No complications.  ANTIBIOTICS:  Perioperative antibiotics were given.  INDICATIONS:  The patient is a 61 year old female who presents with worsening left shoulder pain due to rotator cuff tear.  The patient has failed conservative management, desires operative treatment to relieve pain and restore function.  Informed  consent obtained.  DESCRIPTION OF PROCEDURE:  After an adequate level of anesthesia was achieved, the patient was positioned in modified beach chair position.  Left shoulder correctly identified and sterilely prepped and draped in the usual manner.  Timeout called,  verifying correct patient, correct site, we entered the patient's shoulder using standard portals, including posterior, anterior and lateral portals.  Upon entering the shoulder joint, we noted there to be a significant SLAP tear involving the entire  superior  labrum, which was unstable, the biceps anchor was compromised.  We performed a biceps tenotomy and labral debridement back to stable labral rim.  The patient also had some chondromalacia centrally and inferiorly on the glenoid surface including  a degenerative labral tear, which was debrided using suction shaver.  There was some partial thickness cartilage loss on the glenoid and on the humeral head.  There were no delaminated areas on the humeral head, so that was left alone and just  photographed.  Subscap looked normal.  The supraspinatus and infraspinatus were torn.  Teres minor was intact.  We went ahead and placed the scope in the subacromial space.  A thorough bursectomy and acromioplasty was performed creating a type 1 acromial  shape with a butcher block technique utilizing a high-speed bur.  We released the CA ligament.  We had nice decompression of that rotator cuff outlet.  We were able to visualize the tear from the bursal surface, which was a full-thickness tear.  At this  point, we concluded the arthroscopic portion of procedure.  We then went to the mini open incision, which we started at the anterolateral border of the acromion extending distally about 4 cm in the raphae between the anterior and lateral heads of the  deltoid.  Dissection down through subcutaneous tissues after 10 blade scalpel for the skin.  We used a Bovie down through the subcutaneous tissues to the deltoid raphae.  We went ahead and developed that with a needle tip Bovie and a pair of scissors and  we were able to bluntly dissect the medial and lateral heads away from each other and then placed our deep retractor.  Again, this was about a 4 cm mini open incision.  Once we had our retractors placed we went ahead  and identified the biceps groove, we  delivered the biceps tendon out of the wound with a needle tip Bovie and pickups.  We went ahead and then whipstitched the biceps using #2 Hi-Fi suture to reinforce the tendon.   We then prepped the floor of the biceps groove with needle tip Bovie and a  Freer elevator getting down to bleeding bone.  We placed a single Y-Knot flex anchor through the floor of the biceps groove and brought that suture up in a mattress fashion through the reinforced portion of the tendon, tying it down flush to the tunnel.   We took the longitudinal whipstitch sutures up through the rotator interval and tied over a soft tissue bridge incorporating part of the subscap.  We oversewed the soft tissue at the top part of the biceps tenodesis site with 0 Vicryl figure-of-eight  suture x2.  Next, we went to the rotator cuff tear.  This was a 2-3 cm anterior to posterior tear involving supraspinatus and anterior infraspinatus.  We first freshened up the tendon as there was some degenerative tendon that appeared fairly nonviable  that was at the tendon margin.  Once we had the tendon back to stable tendon tissue and healthy tendon tissue, we placed #2 Hi-Fi suture x4 in the free edge of the tendon.  We mobilized the tendon with Cobb elevator on both sides of the tendon, both the  bursal side and the joint side.  Once we had that really easily mobilizable, we were able to get that back to the greater tuberosity anatomically.  We went ahead and prepped the greater tuberosity with rongeur and curette getting down to bleeding bone  and then took a single Y-Knot RC anchor, doubled with ribbon and placed that at the medial aspect of the greater tuberosity and the medial aspect of the rotator cuff footprint.  We brought those double-loaded ribbon sutures up which was 2 mattress  sutures to tie down the medial part of the footprint.  We took the 4 lateral sutures down through drill holes in the bone and tied over the lateral bone bridge.  We supplemented anteriorly and posteriorly with some additional suture just to make sure it  was watertight, which it was, everything moving together as a unit.  We ranged the  shoulder, no impingement or gapping was noted.  We irrigated thoroughly and repaired the deltoid anatomically with 0 Vicryl suture followed by 2-0 Vicryl for subcutaneous  closure and 4-0 Monocryl for skin and portals.  Steri-Strips applied followed by sterile dressing.  The patient tolerated surgery well.     SUJ D: 10/03/2021 3:50:38 pm T: 10/03/2021 10:00:00 pm  JOB: [id number]/ [id number]

## 2021-10-03 NOTE — Interval H&P Note (Signed)
History and Physical Interval Note:  10/03/2021 11:07 AM  [name]  has presented today for surgery, with the diagnosis of left shoulder rotator cuff tear impingment.  The various methods of treatment have been discussed with the patient and family. After consideration of risks, benefits and other options for treatment, the patient has consented to  Procedure(s) with comments: SHOULDER ARTHROSCOPY WITH mini open ROTATOR CUFF REPAIR AND SUBACROMIAL DECOMPRESSION (Left) - with ISB as a surgical intervention.  The patient's history has been reviewed, patient examined, no change in status, stable for surgery.  I have reviewed the patient's chart and labs.  Questions were answered to the patient's satisfaction.     [name]

## 2021-10-03 NOTE — Brief Op Note (Signed)
10/03/2021  3:42 PM  PATIENT:  [name]  61 y.o. female  PRE-OPERATIVE DIAGNOSIS:  left shoulder rotator cuff tear  POST-OPERATIVE DIAGNOSIS:  left shoulder rotator cuff tear, SLAP tear   PROCEDURE:  Procedure(s) with comments: SHOULDER ARTHROSCOPY WITH mini open ROTATOR CUFF REPAIR AND SUBACROMIAL DECOMPRESSION (Left) - with ISB Biceps tenodesis  SURGEON:  Surgeon(s) and Role:    Netta Cedars, MD - Primary  PHYSICIAN ASSISTANT:   ASSISTANTS: Ventura Bruns, PA-C   ANESTHESIA:   regional and general  EBL:  100 mL   BLOOD ADMINISTERED:none  DRAINS: none   LOCAL MEDICATIONS USED:  MARCAINE     SPECIMEN:  No Specimen  DISPOSITION OF SPECIMEN:  N/A  COUNTS:  YES  TOURNIQUET:  * No tourniquets in log *  DICTATION: .Other Dictation: Dictation Number [id number]  PLAN OF CARE: Discharge to home after PACU  PATIENT DISPOSITION:  PACU - hemodynamically stable.   Delay start of Pharmacological VTE agent (>24hrs) due to surgical blood loss or risk of bleeding: not applicable

## 2021-10-03 NOTE — Interval H&P Note (Signed)
History and Physical Interval Note:  10/03/2021 1:01 PM  [name]  has presented today for surgery, with the diagnosis of left shoulder rotator cuff tear impingment.  The various methods of treatment have been discussed with the patient and family. After consideration of risks, benefits and other options for treatment, the patient has consented to  Procedure(s) with comments: SHOULDER ARTHROSCOPY WITH mini open ROTATOR CUFF REPAIR AND SUBACROMIAL DECOMPRESSION (Left) - with ISB as a surgical intervention.  The patient's history has been reviewed, patient examined, no change in status, stable for surgery.  I have reviewed the patient's chart and labs.  Questions were answered to the patient's satisfaction.     [name]

## 2021-10-03 NOTE — Transfer of Care (Signed)
Immediate Anesthesia Transfer of Care Note  Patient: [name]  Procedure(s) Performed: SHOULDER ARTHROSCOPY WITH mini open ROTATOR CUFF REPAIR AND SUBACROMIAL DECOMPRESSION (Left)  Patient Location: PACU  Anesthesia Type:General  Level of Consciousness: sedated, patient cooperative and responds to stimulation  Airway & Oxygen Therapy: Patient Spontanous Breathing and Patient connected to face mask oxygen  Post-op Assessment: Report given to RN and Post -op Vital signs reviewed and stable  Post vital signs: Reviewed and stable  Last Vitals:  Vitals Value Taken Time  BP 183/98 10/03/21 1545  Temp    Pulse 95 10/03/21 1547  Resp 18 10/03/21 1547  SpO2 100 % 10/03/21 1547  Vitals shown include unvalidated device data.  Last Pain:  Vitals:   10/03/21 1123  TempSrc:   PainSc: 8       Patients Stated Pain Goal: 7 (45/62/56 3893)  Complications: No notable events documented.

## 2021-10-08 ENCOUNTER — Encounter (HOSPITAL_COMMUNITY): Payer: Self-pay | Admitting: Orthopedic Surgery

## 2021-10-09 DIAGNOSIS — M25512 Pain in left shoulder: Secondary | ICD-10-CM | POA: Diagnosis not present

## 2021-10-16 DIAGNOSIS — M25512 Pain in left shoulder: Secondary | ICD-10-CM | POA: Diagnosis not present

## 2021-10-23 DIAGNOSIS — Z79899 Other long term (current) drug therapy: Secondary | ICD-10-CM | POA: Diagnosis not present

## 2021-10-23 DIAGNOSIS — M25512 Pain in left shoulder: Secondary | ICD-10-CM | POA: Diagnosis not present

## 2021-10-23 DIAGNOSIS — M5416 Radiculopathy, lumbar region: Secondary | ICD-10-CM | POA: Diagnosis not present

## 2021-10-23 DIAGNOSIS — G894 Chronic pain syndrome: Secondary | ICD-10-CM | POA: Diagnosis not present

## 2021-10-23 DIAGNOSIS — Z5181 Encounter for therapeutic drug level monitoring: Secondary | ICD-10-CM | POA: Diagnosis not present

## 2021-10-23 DIAGNOSIS — M5136 Other intervertebral disc degeneration, lumbar region: Secondary | ICD-10-CM | POA: Diagnosis not present

## 2021-10-23 DIAGNOSIS — M5030 Other cervical disc degeneration, unspecified cervical region: Secondary | ICD-10-CM | POA: Diagnosis not present

## 2021-11-03 DIAGNOSIS — M5416 Radiculopathy, lumbar region: Secondary | ICD-10-CM | POA: Diagnosis not present

## 2021-11-29 ENCOUNTER — Other Ambulatory Visit: Payer: Self-pay | Admitting: Internal Medicine

## 2021-12-11 DIAGNOSIS — R208 Other disturbances of skin sensation: Secondary | ICD-10-CM | POA: Diagnosis not present

## 2021-12-11 DIAGNOSIS — L905 Scar conditions and fibrosis of skin: Secondary | ICD-10-CM | POA: Diagnosis not present

## 2021-12-11 DIAGNOSIS — R209 Unspecified disturbances of skin sensation: Secondary | ICD-10-CM | POA: Diagnosis not present

## 2021-12-11 DIAGNOSIS — L910 Hypertrophic scar: Secondary | ICD-10-CM | POA: Diagnosis not present

## 2021-12-21 ENCOUNTER — Other Ambulatory Visit: Payer: Self-pay | Admitting: Cardiology

## 2021-12-21 ENCOUNTER — Other Ambulatory Visit: Payer: Self-pay | Admitting: Internal Medicine

## 2021-12-21 DIAGNOSIS — I209 Angina pectoris, unspecified: Secondary | ICD-10-CM

## 2021-12-21 DIAGNOSIS — I2510 Atherosclerotic heart disease of native coronary artery without angina pectoris: Secondary | ICD-10-CM

## 2021-12-21 NOTE — Telephone Encounter (Signed)
Please refill as per office routine med refill policy (all routine meds to be refilled for 3 mo or monthly (per pt preference) up to one year from last visit, then month to month grace period for 3 mo, then further med refills will have to be denied) ? ?

## 2021-12-22 ENCOUNTER — Telehealth: Payer: Self-pay | Admitting: *Deleted

## 2021-12-22 NOTE — Telephone Encounter (Signed)
PA done via cover my meds for vivelle dot patches 0.05 mg. Pending response from [employer].

## 2021-12-26 ENCOUNTER — Encounter: Payer: Self-pay | Admitting: *Deleted

## 2021-12-26 NOTE — Telephone Encounter (Signed)
Femring not tolerated, cannot tolerate vaginal inserts and she is not a candidate for oral estrogen

## 2021-12-26 NOTE — Telephone Encounter (Signed)
Vivelle dot patched denied by [employer]   "Estradiol twice weekly patch is denied because it is not on your plan's Drug List (formulary). Medication authorization requires the following: (1) You need to try this covered drug: Femring; OR (2) your doctor needs to give [place] specific medical reasons why the covered drug is not appropriate for you. Reviewed by: Austin Miles.Ph   Please advise

## 2021-12-26 NOTE — Telephone Encounter (Signed)
Letter faxed to appeals [phone].

## 2022-01-14 ENCOUNTER — Encounter: Payer: Self-pay | Admitting: Internal Medicine

## 2022-01-14 ENCOUNTER — Ambulatory Visit (INDEPENDENT_AMBULATORY_CARE_PROVIDER_SITE_OTHER): Payer: Medicare Other | Admitting: Internal Medicine

## 2022-01-14 VITALS — BP 142/90 | HR 94 | Temp 98.0°F | Ht 65.0 in | Wt 135.0 lb

## 2022-01-14 DIAGNOSIS — E1165 Type 2 diabetes mellitus with hyperglycemia: Secondary | ICD-10-CM | POA: Diagnosis not present

## 2022-01-14 DIAGNOSIS — E538 Deficiency of other specified B group vitamins: Secondary | ICD-10-CM | POA: Diagnosis not present

## 2022-01-14 DIAGNOSIS — I10 Essential (primary) hypertension: Secondary | ICD-10-CM | POA: Diagnosis not present

## 2022-01-14 DIAGNOSIS — E782 Mixed hyperlipidemia: Secondary | ICD-10-CM | POA: Diagnosis not present

## 2022-01-14 DIAGNOSIS — Z0000 Encounter for general adult medical examination without abnormal findings: Secondary | ICD-10-CM

## 2022-01-14 DIAGNOSIS — F17200 Nicotine dependence, unspecified, uncomplicated: Secondary | ICD-10-CM

## 2022-01-14 DIAGNOSIS — E559 Vitamin D deficiency, unspecified: Secondary | ICD-10-CM

## 2022-01-14 DIAGNOSIS — Z23 Encounter for immunization: Secondary | ICD-10-CM

## 2022-01-14 LAB — BASIC METABOLIC PANEL
BUN: 16 mg/dL (ref 6–23)
CO2: 30 mEq/L (ref 19–32)
Calcium: 9.8 mg/dL (ref 8.4–10.5)
Chloride: 103 mEq/L (ref 96–112)
Creatinine, Ser: 0.89 mg/dL (ref 0.40–1.20)
GFR: 71.2 mL/min (ref >60.00)
Glucose, Bld: 96 mg/dL (ref 70–99)
Potassium: 3.7 mEq/L (ref 3.5–5.1)
Sodium: 140 mEq/L (ref 135–145)

## 2022-01-14 LAB — HEPATIC FUNCTION PANEL
ALT: 18 U/L (ref 0–35)
AST: 23 U/L (ref 0–37)
Albumin: 4.2 g/dL (ref 3.5–5.2)
Alkaline Phosphatase: 119 U/L — ABNORMAL HIGH (ref 39–117)
Bilirubin, Direct: 0.1 mg/dL (ref 0.0–0.3)
Total Bilirubin: 0.3 mg/dL (ref 0.2–1.2)
Total Protein: 7.8 g/dL (ref 6.0–8.3)

## 2022-01-14 LAB — LIPID PANEL
Cholesterol: 145 mg/dL (ref 0–200)
HDL: 42.2 mg/dL (ref >39.00)
LDL Cholesterol: 84 mg/dL (ref 0–99)
NonHDL: 102.56
Total CHOL/HDL Ratio: 3
Triglycerides: 91 mg/dL (ref 0.0–149.0)
VLDL: 18.2 mg/dL (ref 0.0–40.0)

## 2022-01-14 LAB — VITAMIN D 25 HYDROXY (VIT D DEFICIENCY, FRACTURES): VITD: 43.36 ng/mL (ref 30.00–100.00)

## 2022-01-14 LAB — VITAMIN B12: Vitamin B-12: 621 pg/mL (ref 211–911)

## 2022-01-14 LAB — HEMOGLOBIN A1C: Hgb A1c MFr Bld: 6.8 % — ABNORMAL HIGH (ref 4.6–6.5)
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
## 2022-01-14 NOTE — Assessment & Plan Note (Signed)
BP Readings from Last 3 Encounters:  01/14/22 (!) 142/90  10/03/21 (!) 173/85  09/24/21 121/70   Uncontrolled, but pt states better controlled at home, may be elevated today related to pain, pt to continue medical treatment norvasc 10 mg qd, bidil tid, losartan 100 mg, toprol xl 100 qd

## 2022-01-14 NOTE — Assessment & Plan Note (Signed)
Pt counsled to quit, pt not ready °

## 2022-01-14 NOTE — Assessment & Plan Note (Signed)
Lab Results  Component Value Date   HGBA1C 5.9 (H) 09/24/2021   Stable, pt to continue current medical treatment metformin 500 mg qd, for f/u lab today

## 2022-01-14 NOTE — Patient Instructions (Signed)
You had the flu shot today  Please have your Shingrix (shingles) shots done at your local pharmacy.  Please continue all other medications as before, and refills have been done if requested.  Please have the pharmacy call with any other refills you may need.  Please continue your efforts at being more active, low cholesterol diet  Please keep your appointments with your specialists as you may have planned - Dr Doren Custard for the shoulders  Please go to the LAB at the blood drawing area for the tests to be done  You will be contacted by phone if any changes need to be made immediately.  Otherwise, you will receive a letter about your results with an explanation, but please check with MyChart first.  Please remember to sign up for MyChart if you have not done so, as this will be important to you in the future with finding out test results, communicating by private email, and scheduling acute appointments online when needed.  Please quit smoking  Please make an Appointment to return in 6 months, or sooner if needed

## 2022-01-14 NOTE — Assessment & Plan Note (Signed)
Lab Results  Component Value Date   LDLCALC 116 (H) 07/15/2021   Uncontrolled, has been working on lower chol diuet,  pt to continue current statin lipitor 80 mg, for recheck lab today

## 2022-01-14 NOTE — Assessment & Plan Note (Signed)
Last vitamin D Lab Results  Component Value Date   VD25OH 28.07 (L) 07/15/2021   Low, to start oral replacement

## 2022-02-04 ENCOUNTER — Other Ambulatory Visit: Payer: Self-pay | Admitting: Internal Medicine

## 2022-02-10 ENCOUNTER — Other Ambulatory Visit: Payer: Self-pay | Admitting: Internal Medicine

## 2022-02-10 DIAGNOSIS — G629 Polyneuropathy, unspecified: Secondary | ICD-10-CM

## 2022-02-10 DIAGNOSIS — E119 Type 2 diabetes mellitus without complications: Secondary | ICD-10-CM

## 2022-02-12 DIAGNOSIS — M5136 Other intervertebral disc degeneration, lumbar region: Secondary | ICD-10-CM | POA: Diagnosis not present

## 2022-02-12 DIAGNOSIS — M5030 Other cervical disc degeneration, unspecified cervical region: Secondary | ICD-10-CM | POA: Diagnosis not present

## 2022-02-12 DIAGNOSIS — G894 Chronic pain syndrome: Secondary | ICD-10-CM | POA: Diagnosis not present

## 2022-02-12 DIAGNOSIS — M5416 Radiculopathy, lumbar region: Secondary | ICD-10-CM | POA: Diagnosis not present

## 2022-02-12 DIAGNOSIS — M5412 Radiculopathy, cervical region: Secondary | ICD-10-CM | POA: Diagnosis not present

## 2022-02-12 DIAGNOSIS — F17200 Nicotine dependence, unspecified, uncomplicated: Secondary | ICD-10-CM | POA: Diagnosis not present

## 2022-02-26 DIAGNOSIS — M5416 Radiculopathy, lumbar region: Secondary | ICD-10-CM | POA: Diagnosis not present

## 2022-03-09 ENCOUNTER — Ambulatory Visit: Payer: 59

## 2022-03-09 DIAGNOSIS — I2510 Atherosclerotic heart disease of native coronary artery without angina pectoris: Secondary | ICD-10-CM

## 2022-03-16 NOTE — Progress Notes (Signed)
Results given, no further questions. Moved her back to Dr. Brennan Bailey schedule.

## 2022-03-18 ENCOUNTER — Ambulatory Visit: Payer: 59 | Admitting: Cardiology

## 2022-03-19 ENCOUNTER — Ambulatory Visit: Payer: 59 | Admitting: Internal Medicine

## 2022-03-23 ENCOUNTER — Ambulatory Visit: Payer: 59 | Admitting: Cardiology

## 2022-04-17 ENCOUNTER — Other Ambulatory Visit: Payer: Self-pay | Admitting: Cardiology

## 2022-04-17 ENCOUNTER — Other Ambulatory Visit: Payer: Self-pay | Admitting: Internal Medicine

## 2022-04-17 DIAGNOSIS — I209 Angina pectoris, unspecified: Secondary | ICD-10-CM

## 2022-04-17 DIAGNOSIS — I2510 Atherosclerotic heart disease of native coronary artery without angina pectoris: Secondary | ICD-10-CM

## 2022-04-17 NOTE — Telephone Encounter (Signed)
Please refill as per office routine med refill policy (all routine meds to be refilled for 3 mo or monthly (per pt preference) up to one year from last visit, then month to month grace period for 3 mo, then further med refills will have to be denied) ? ?

## 2022-04-27 DIAGNOSIS — H16041 Marginal corneal ulcer, right eye: Secondary | ICD-10-CM | POA: Diagnosis not present

## 2022-05-05 DIAGNOSIS — H16041 Marginal corneal ulcer, right eye: Secondary | ICD-10-CM | POA: Diagnosis not present

## 2022-05-06 ENCOUNTER — Telehealth: Payer: Self-pay | Admitting: Internal Medicine

## 2022-05-06 NOTE — Telephone Encounter (Signed)
Called patient to schedule Medicare Annual Wellness Visit (AWV). Left message for patient to call back and schedule Medicare Annual Wellness Visit (AWV).  Last date of AWV: 04/29/21  Please schedule an appointment at any time with [facility] .  If any questions, please contact me at [phone].  Thank you ,  Barkley Boards AWV direct phone # [phone]

## 2022-05-12 ENCOUNTER — Telehealth: Payer: Self-pay | Admitting: Internal Medicine

## 2022-05-12 MED ORDER — DIAZEPAM 5 MG PO TABS: ORAL_TABLET | ORAL | 2 refills | Status: DC

## 2022-05-12 NOTE — Telephone Encounter (Signed)
Prescription Request  05/12/2022  LOV: 01/14/2022  What is the name of the medication or equipment? diazepam (VALIUM) 5 MG tablet   Have you contacted your pharmacy to request a refill? No   Which pharmacy would you like this sent to?   [facility], [place] [address] [place] [address] Phone: [phone] Fax: [phone]     Patient notified that their request is being sent to the clinical staff for review and that they should receive a response within 2 business days.   Please advise at Mobile [phone] (mobile)

## 2022-05-12 NOTE — Telephone Encounter (Signed)
Called patient to schedule Medicare Annual Wellness Visit (AWV). Left message for patient to call back and schedule Medicare Annual Wellness Visit (AWV).  Last date of AWV: 04/30/22    If any questions, please contact me at [phone].  Thank you ,  Barkley Boards AWV direct phone # [phone]   Patient returned my call 05/11/22

## 2022-05-12 NOTE — Telephone Encounter (Signed)
Done erx 

## 2022-05-13 ENCOUNTER — Telehealth: Payer: Self-pay | Admitting: Internal Medicine

## 2022-05-13 DIAGNOSIS — H16041 Marginal corneal ulcer, right eye: Secondary | ICD-10-CM | POA: Diagnosis not present

## 2022-05-13 NOTE — Telephone Encounter (Signed)
Contacted [name] to schedule their annual wellness visit. Appointment made for 05/22/22.  [name] AWV direct phone # [phone]

## 2022-05-17 ENCOUNTER — Other Ambulatory Visit: Payer: Self-pay | Admitting: Internal Medicine

## 2022-05-17 DIAGNOSIS — I209 Angina pectoris, unspecified: Secondary | ICD-10-CM

## 2022-05-22 ENCOUNTER — Ambulatory Visit (INDEPENDENT_AMBULATORY_CARE_PROVIDER_SITE_OTHER): Payer: 59

## 2022-05-22 VITALS — Ht 65.0 in | Wt 135.0 lb

## 2022-05-22 DIAGNOSIS — Z0000 Encounter for general adult medical examination without abnormal findings: Secondary | ICD-10-CM
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
## 2022-05-22 NOTE — Patient Instructions (Signed)
[name] , Thank you for taking time to come for your Medicare Wellness Visit. I appreciate your ongoing commitment to your health goals. Please review the following plan we discussed and let me know if I can assist you in the future.   These are the goals we discussed:  Goals      Patient Stated     05/22/2022, wants to gain strength        This is a list of the screening recommended for you and due dates:  Health Maintenance  Topic Date Due   Zoster (Shingles) Vaccine (1 of 2) Never done   Eye exam for diabetics  06/03/2022   Hemoglobin A1C  07/15/2022   Yearly kidney health urinalysis for diabetes  07/16/2022   Complete foot exam   07/16/2022   Flu Shot  09/17/2022   Yearly kidney function blood test for diabetes  01/15/2023   Medicare Annual Wellness Visit  05/22/2023   Mammogram  07/24/2023   Pap Smear  07/24/2024   Colon Cancer Screening  08/27/2024   DTaP/Tdap/Td vaccine (3 - Td or Tdap) 04/01/2031   Hepatitis C Screening: USPSTF Recommendation to screen - Ages 5018-79 yo.  Completed   HIV Screening  Completed   HPV Vaccine  Aged Out   COVID-19 Vaccine  Discontinued    Advanced directives: Advance directive discussed with you today.   Conditions/risks identified: smoking  Next appointment: Follow up in one year for your annual wellness visit.   Preventive Care 40-64 Years, Female Preventive care refers to lifestyle choices and visits with your health care provider that can promote health and wellness. What does preventive care include? A yearly physical exam. This is also called an annual well check. Dental exams once or twice a year. Routine eye exams. Ask your health care provider how often you should have your eyes checked. Personal lifestyle choices, including: Daily care of your teeth and gums. Regular physical activity. Eating a healthy diet. Avoiding tobacco and drug use. Limiting alcohol use. Practicing safe sex. Taking low-dose aspirin daily  starting at age 60. Taking vitamin and mineral supplements as recommended by your health care provider. What happens during an annual well check? The services and screenings done by your health care provider during your annual well check will depend on your age, overall health, lifestyle risk factors, and family history of disease. Counseling  Your health care provider may ask you questions about your: Alcohol use. Tobacco use. Drug use. Emotional well-being. Home and relationship well-being. Sexual activity. Eating habits. Work and work [employer]. Method of birth control. Menstrual cycle. Pregnancy history. Screening  You may have the following tests or measurements: Height, weight, and BMI. Blood pressure. Lipid and cholesterol levels. These may be checked every 5 years, or more frequently if you are over 60 years old. Skin check. Lung cancer screening. You may have this screening every year starting at age 60 if you have a 30-pack-year history of smoking and currently smoke or have quit within the past 15 years. Fecal occult blood test (FOBT) of the stool. You may have this test every year starting at age 60. Flexible sigmoidoscopy or colonoscopy. You may have a sigmoidoscopy every 5 years or a colonoscopy every 10 years starting at age 60. Hepatitis C blood test. Hepatitis B blood test. Sexually transmitted disease (STD) testing. Diabetes screening. This is done by checking your blood sugar (glucose) after you have not eaten for a while (fasting). You may have this done every 1-3  years. Mammogram. This may be done every 1-2 years. Talk to your health care provider about when you should start having regular mammograms. This may depend on whether you have a family history of breast cancer. BRCA-related cancer screening. This may be done if you have a family history of breast, ovarian, tubal, or peritoneal cancers. Pelvic exam and Pap test. This may be done every 3 years starting  at age 57. Starting at age 30, this may be done every 5 years if you have a Pap test in combination with an HPV test. Bone density scan. This is done to screen for osteoporosis. You may have this scan if you are at high risk for osteoporosis. Discuss your test results, treatment options, and if necessary, the need for more tests with your health care provider. Vaccines  Your health care provider may recommend certain vaccines, such as: Influenza vaccine. This is recommended every year. Tetanus, diphtheria, and acellular pertussis (Tdap, Td) vaccine. You may need a Td booster every 10 years. Zoster vaccine. You may need this after age 75. Pneumococcal 13-valent conjugate (PCV13) vaccine. You may need this if you have certain conditions and were not previously vaccinated. Pneumococcal polysaccharide (PPSV23) vaccine. You may need one or two doses if you smoke cigarettes or if you have certain conditions. Talk to your health care provider about which screenings and vaccines you need and how often you need them. This information is not intended to replace advice given to you by your health care provider. Make sure you discuss any questions you have with your health care provider. Document Released: 03/01/2015 Document Revised: 10/23/2015 Document Reviewed: 12/04/2014 Elsevier Interactive Patient Education  2017 [employer].    Fall Prevention in the Home Falls can cause injuries. They can happen to people of all ages. There are many things you can do to make your home safe and to help prevent falls. What can I do on the outside of my home? Regularly fix the edges of walkways and driveways and fix any cracks. Remove anything that might make you trip as you walk through a door, such as a raised step or threshold. Trim any bushes or trees on the path to your home. Use bright outdoor lighting. Clear any walking paths of anything that might make someone trip, such as rocks or tools. Regularly check  to see if handrails are loose or broken. Make sure that both sides of any steps have handrails. Any raised decks and porches should have guardrails on the edges. Have any leaves, snow, or ice cleared regularly. Use sand or salt on walking paths during winter. Clean up any spills in your garage right away. This includes oil or grease spills. What can I do in the bathroom? Use night lights. Install grab bars by the toilet and in the tub and shower. Do not use towel bars as grab bars. Use non-skid mats or decals in the tub or shower. If you need to sit down in the shower, use a plastic, non-slip stool. Keep the floor dry. Clean up any water that spills on the floor as soon as it happens. Remove soap buildup in the tub or shower regularly. Attach bath mats securely with double-sided non-slip rug tape. Do not have throw rugs and other things on the floor that can make you trip. What can I do in the bedroom? Use night lights. Make sure that you have a light by your bed that is easy to reach. Do not use any sheets or blankets  that are too big for your bed. They should not hang down onto the floor. Have a firm chair that has side arms. You can use this for support while you get dressed. Do not have throw rugs and other things on the floor that can make you trip. What can I do in the kitchen? Clean up any spills right away. Avoid walking on wet floors. Keep items that you use a lot in easy-to-reach places. If you need to reach something above you, use a strong step stool that has a grab bar. Keep electrical cords out of the way. Do not use floor polish or wax that makes floors slippery. If you must use wax, use non-skid floor wax. Do not have throw rugs and other things on the floor that can make you trip. What can I do with my stairs? Do not leave any items on the stairs. Make sure that there are handrails on both sides of the stairs and use them. Fix handrails that are broken or loose. Make  sure that handrails are as long as the stairways. Check any carpeting to make sure that it is firmly attached to the stairs. Fix any carpet that is loose or worn. Avoid having throw rugs at the top or bottom of the stairs. If you do have throw rugs, attach them to the floor with carpet tape. Make sure that you have a light switch at the top of the stairs and the bottom of the stairs. If you do not have them, ask someone to add them for you. What else can I do to help prevent falls? Wear shoes that: Do not have high heels. Have rubber bottoms. Are comfortable and fit you well. Are closed at the toe. Do not wear sandals. If you use a stepladder: Make sure that it is fully opened. Do not climb a closed stepladder. Make sure that both sides of the stepladder are locked into place. Ask someone to hold it for you, if possible. Clearly mark and make sure that you can see: Any grab bars or handrails. First and last steps. Where the edge of each step is. Use tools that help you move around (mobility aids) if they are needed. These include: Canes. Walkers. Scooters. Crutches. Turn on the lights when you go into a dark area. Replace any light bulbs as soon as they burn out. Set up your furniture so you have a clear path. Avoid moving your furniture around. If any of your floors are uneven, fix them. If there are any pets around you, be aware of where they are. Review your medicines with your doctor. Some medicines can make you feel dizzy. This can increase your chance of falling. Ask your doctor what other things that you can do to help prevent falls. This information is not intended to replace advice given to you by your health care provider. Make sure you discuss any questions you have with your health care provider. Document Released: 11/29/2008 Document Revised: 07/11/2015 Document Reviewed: 03/09/2014 Elsevier Interactive Patient Education  2017 [employer].

## 2022-05-23 IMAGING — CT CT CHEST LUNG CANCER SCREENING LOW DOSE W/O CM
2 of 5 series · 15 of 40 positions shown, 18 images · non-contrast
Comparison: Cardiac CT chest dated 03/11/2020

CLINICAL DATA: 60-year-old female current smoker, with 46 pack-year
history of smoking, for initial lung cancer screening



[Series 4: lung 1.00 br44 cor · coronal · 0.57mm/px · 3 of 280 slices shown]
[im 56/280  lung]
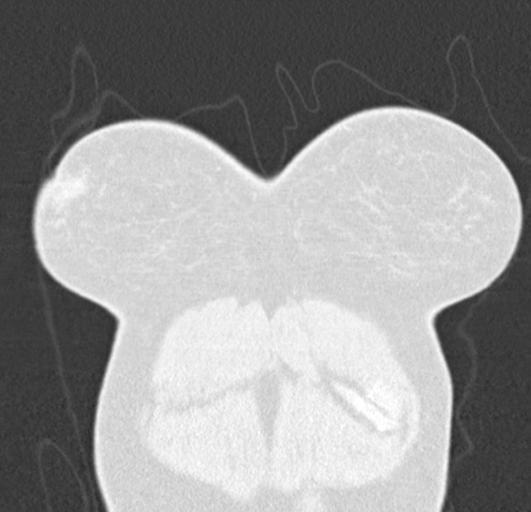
[im 112/280  lung]
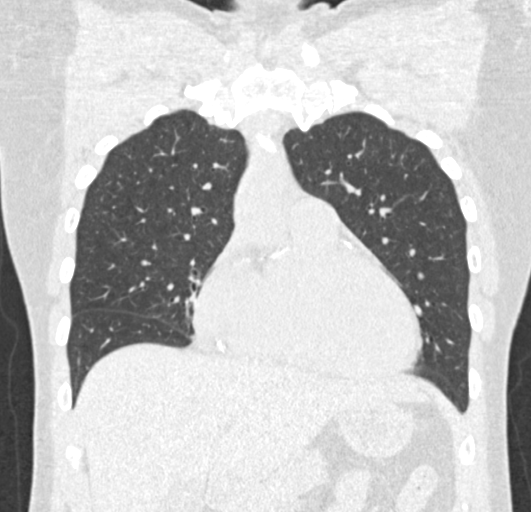
[im 168/280  lung]
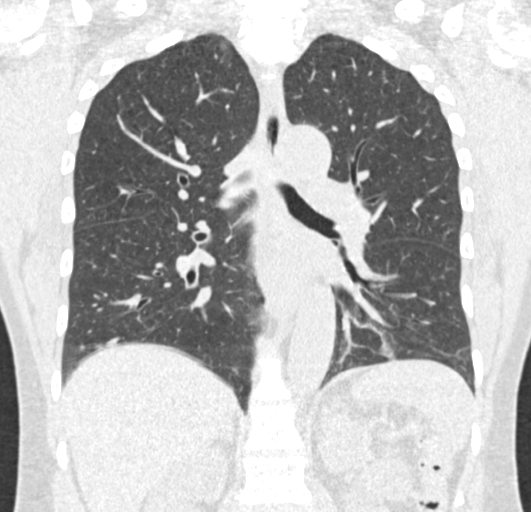

[Series 9: lung 1.00 br60 axial · axial · 0.56mm/px · z∈[-1054,-793]mm · 12 of 289 slices shown, 15 images]
[im 14/289  mediastinal]
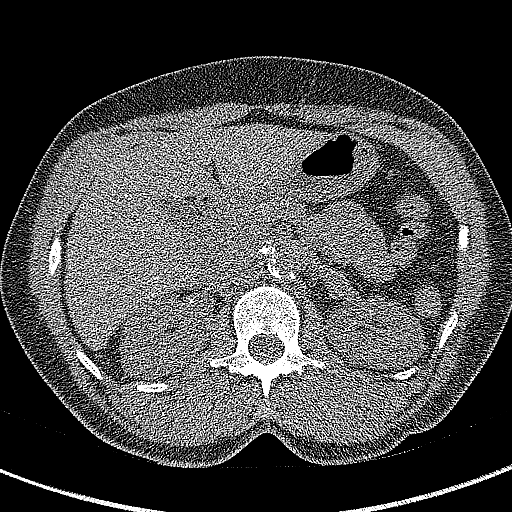
[im 14/289  lung]
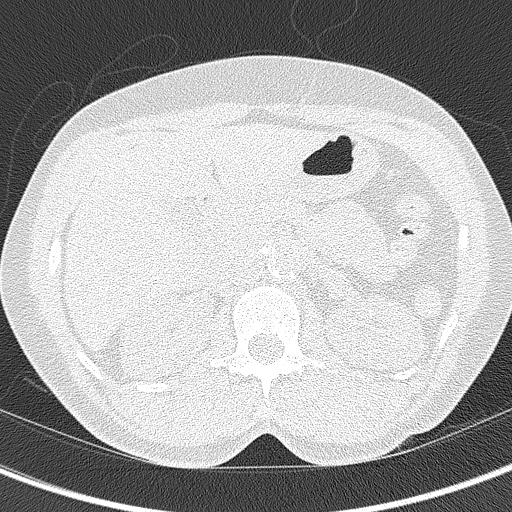
[im 40/289  lung]
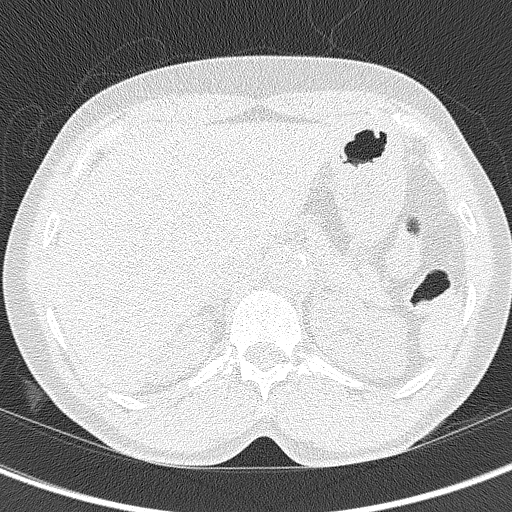
[im 66/289  lung]
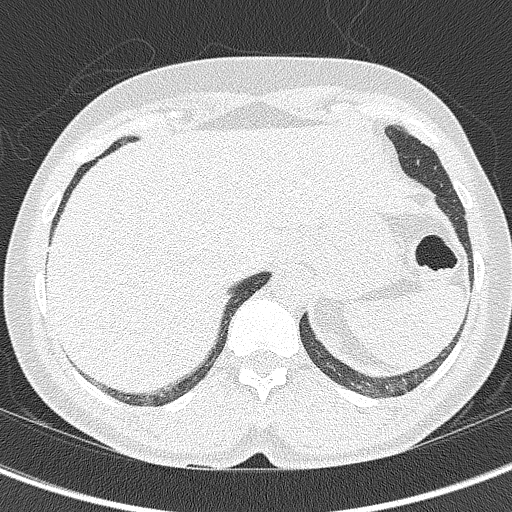
[im 92/289  lung]
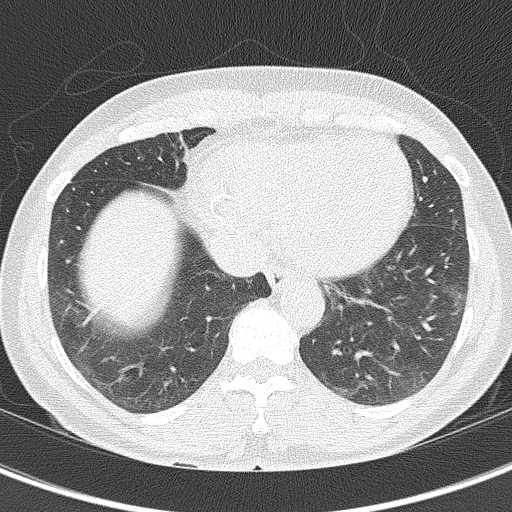
[im 105/289  mediastinal]
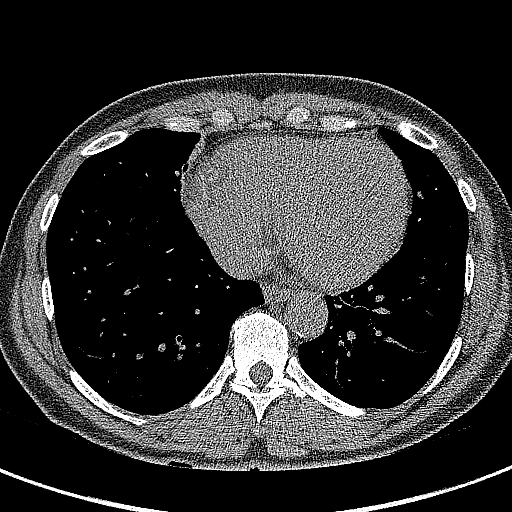
[im 105/289  lung]
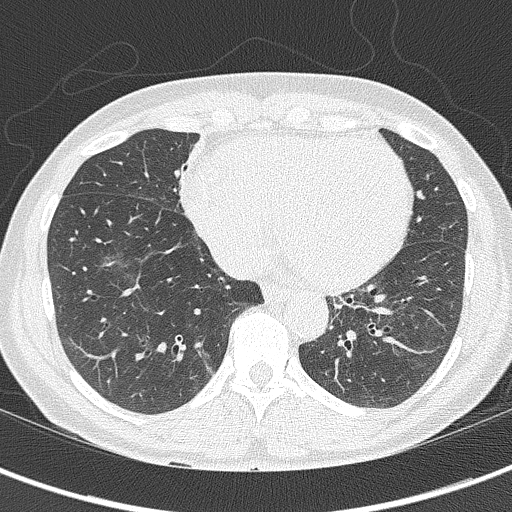
[im 131/289  lung]
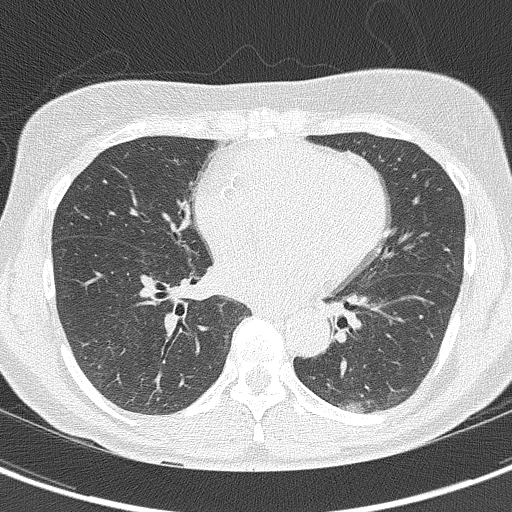
[im 158/289  lung]
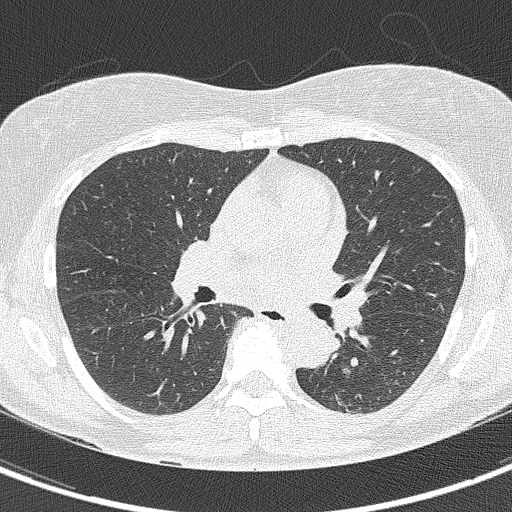
[im 184/289  lung]
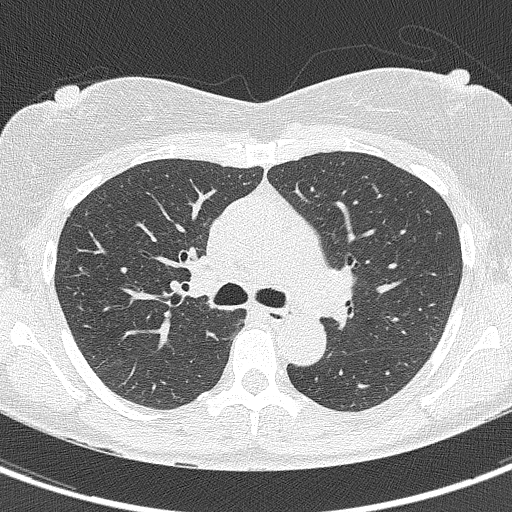
[im 197/289  mediastinal]
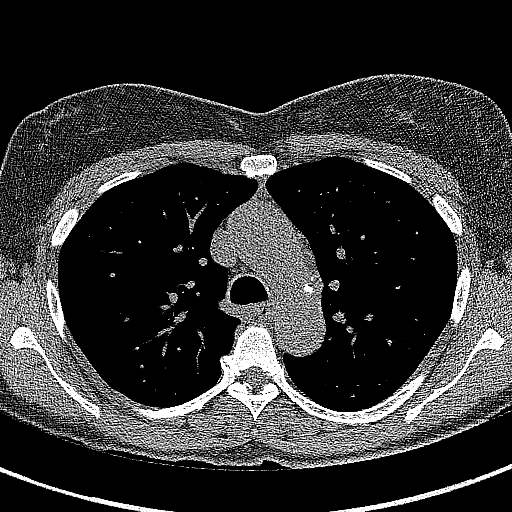
[im 197/289  lung]
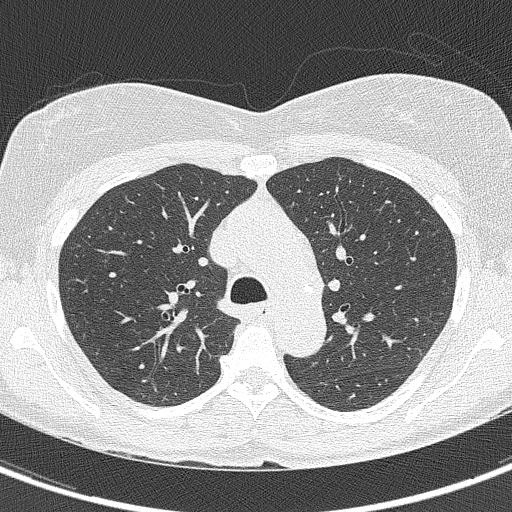
[im 223/289  lung]
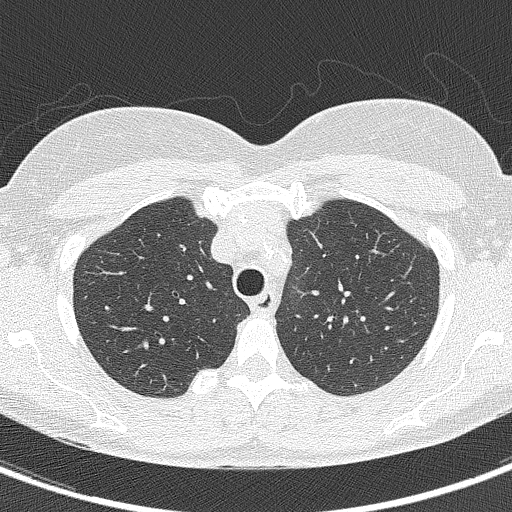
[im 249/289  lung]
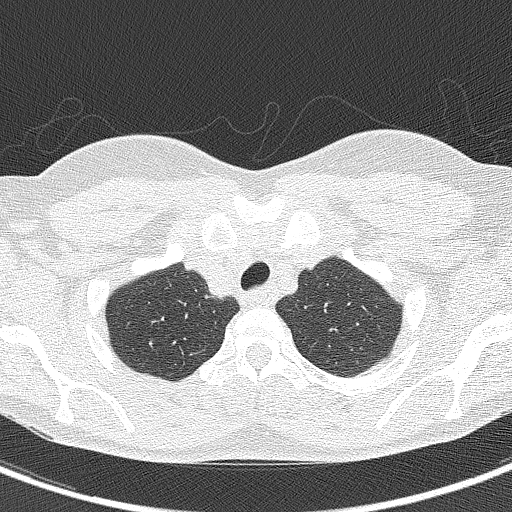
[im 275/289  lung]
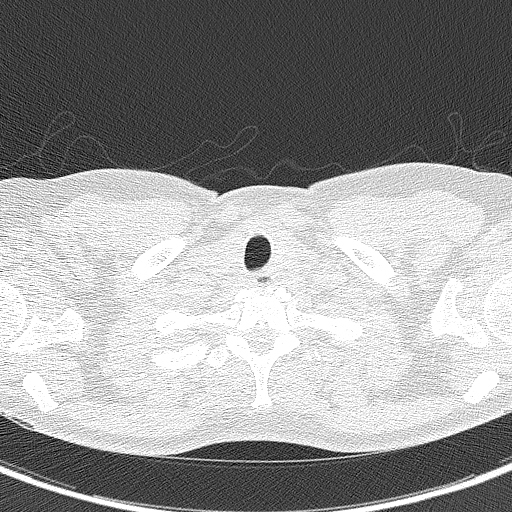

[15 of 40 positions shown; findings below may reference images not displayed]

FINDINGS: Cardiovascular: The heart is normal in size. No pericardial
effusion.

No evidence of thoracic aortic aneurysm. Atherosclerotic
calcifications of the aortic arch.

Three vessel coronary sclerosis.

Mediastinum/Nodes: No suspicious mediastinal lymphadenopathy.

Visualized thyroid is unremarkable.

Lungs/Pleura: Mild centrilobular emphysematous changes, upper lung
predominant.

Mild patchy/ground-glass opacities in the bilateral lower lobes,
favoring atelectasis. Mild compressive atelectasis of the medial
right lower lobe.

4.7 mm calcified granuloma in the right lower lobe, benign.

No pleural effusion or pneumothorax.

Upper Abdomen: Visualized upper abdomen is grossly unremarkable,
noting vascular calcifications.

Musculoskeletal: Mild degenerative changes of the mid thoracic
spine.
IMPRESSION: Lung-RADS 1, negative. Continue annual screening with low-dose chest
CT without contrast in 12 months.

Aortic Atherosclerosis (YNIPG-YR3.3) and Emphysema ([id number].P).

## 2022-06-03 ENCOUNTER — Other Ambulatory Visit: Payer: Self-pay | Admitting: Internal Medicine

## 2022-06-03 ENCOUNTER — Other Ambulatory Visit: Payer: Self-pay | Admitting: Cardiology

## 2022-06-03 ENCOUNTER — Other Ambulatory Visit: Payer: Self-pay

## 2022-06-03 ENCOUNTER — Other Ambulatory Visit: Payer: Self-pay | Admitting: Radiology

## 2022-06-03 DIAGNOSIS — I209 Angina pectoris, unspecified: Secondary | ICD-10-CM

## 2022-06-03 DIAGNOSIS — I10 Essential (primary) hypertension: Secondary | ICD-10-CM

## 2022-06-03 DIAGNOSIS — I2510 Atherosclerotic heart disease of native coronary artery without angina pectoris: Secondary | ICD-10-CM

## 2022-06-03 MED ORDER — ISOSORB DINITRATE-HYDRALAZINE 20-37.5 MG PO TABS: 1.0000 | ORAL_TABLET | Freq: Three times a day (TID) | ORAL | 0 refills | Status: DC

## 2022-06-03 MED ORDER — KETOCONAZOLE 2 % EX CREA: 1.0000 | TOPICAL_CREAM | Freq: Every day | CUTANEOUS | 2 refills | Status: DC

## 2022-06-03 MED ORDER — NITROGLYCERIN 0.4 MG SL SUBL: 0.4000 mg | SUBLINGUAL_TABLET | SUBLINGUAL | 1 refills | Status: DC | PRN

## 2022-06-03 NOTE — Telephone Encounter (Signed)
Medication refill request: prometrium 100mg  Last AEX:  07-24-21 Next AEX: not scheduled Last MMG (if hormonal medication request): 07-23-21 neg Refill authorized: please approve or deny as appropriate

## 2022-06-09 ENCOUNTER — Encounter: Payer: Self-pay | Admitting: Cardiology

## 2022-06-09 ENCOUNTER — Ambulatory Visit: Payer: 59 | Admitting: Cardiology

## 2022-06-09 VITALS — BP 167/86 | HR 77 | Resp 16 | Ht 65.0 in | Wt 134.6 lb

## 2022-06-09 DIAGNOSIS — F17210 Nicotine dependence, cigarettes, uncomplicated: Secondary | ICD-10-CM | POA: Diagnosis not present

## 2022-06-09 DIAGNOSIS — I2510 Atherosclerotic heart disease of native coronary artery without angina pectoris: Secondary | ICD-10-CM | POA: Diagnosis not present

## 2022-06-09 DIAGNOSIS — I10 Essential (primary) hypertension: Secondary | ICD-10-CM | POA: Diagnosis not present

## 2022-06-09 DIAGNOSIS — I2584 Coronary atherosclerosis due to calcified coronary lesion: Secondary | ICD-10-CM | POA: Diagnosis not present

## 2022-06-09 DIAGNOSIS — E782 Mixed hyperlipidemia: Secondary | ICD-10-CM

## 2022-06-09 DIAGNOSIS — E119 Type 2 diabetes mellitus without complications: Secondary | ICD-10-CM | POA: Diagnosis not present

## 2022-06-09 DIAGNOSIS — F17200 Nicotine dependence, unspecified, uncomplicated: Secondary | ICD-10-CM

## 2022-06-09 DIAGNOSIS — IMO0001 Reserved for inherently not codable concepts without codable children: Secondary | ICD-10-CM

## 2022-06-09 MED ORDER — NITROGLYCERIN 0.4 MG SL SUBL: 0.4000 mg | SUBLINGUAL_TABLET | SUBLINGUAL | 1 refills | Status: DC | PRN

## 2022-06-09 MED ORDER — EMPAGLIFLOZIN 10 MG PO TABS: 10.0000 mg | ORAL_TABLET | Freq: Every day | ORAL | 0 refills | Status: DC
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
## 2022-06-15 ENCOUNTER — Other Ambulatory Visit: Payer: Self-pay | Admitting: Acute Care

## 2022-06-15 DIAGNOSIS — Z87891 Personal history of nicotine dependence: Secondary | ICD-10-CM

## 2022-06-15 DIAGNOSIS — Z122 Encounter for screening for malignant neoplasm of respiratory organs: Secondary | ICD-10-CM

## 2022-06-15 DIAGNOSIS — F17210 Nicotine dependence, cigarettes, uncomplicated: Secondary | ICD-10-CM

## 2022-06-24 ENCOUNTER — Other Ambulatory Visit: Payer: Self-pay | Admitting: Internal Medicine

## 2022-06-24 DIAGNOSIS — Z1231 Encounter for screening mammogram for malignant neoplasm of breast: Secondary | ICD-10-CM

## 2022-06-29 DIAGNOSIS — M47817 Spondylosis without myelopathy or radiculopathy, lumbosacral region: Secondary | ICD-10-CM | POA: Diagnosis not present

## 2022-06-29 DIAGNOSIS — M5136 Other intervertebral disc degeneration, lumbar region: Secondary | ICD-10-CM | POA: Diagnosis not present

## 2022-06-29 DIAGNOSIS — M5416 Radiculopathy, lumbar region: Secondary | ICD-10-CM | POA: Diagnosis not present

## 2022-06-29 DIAGNOSIS — Z79899 Other long term (current) drug therapy: Secondary | ICD-10-CM | POA: Diagnosis not present

## 2022-06-29 DIAGNOSIS — M542 Cervicalgia: Secondary | ICD-10-CM | POA: Diagnosis not present

## 2022-06-29 DIAGNOSIS — M7918 Myalgia, other site: Secondary | ICD-10-CM | POA: Insufficient documentation

## 2022-07-01 DIAGNOSIS — M25512 Pain in left shoulder: Secondary | ICD-10-CM | POA: Diagnosis not present

## 2022-07-01 DIAGNOSIS — M25511 Pain in right shoulder: Secondary | ICD-10-CM | POA: Diagnosis not present

## 2022-07-06 ENCOUNTER — Other Ambulatory Visit: Payer: Self-pay | Admitting: Gastroenterology

## 2022-07-06 DIAGNOSIS — K219 Gastro-esophageal reflux disease without esophagitis: Secondary | ICD-10-CM

## 2022-07-15 ENCOUNTER — Ambulatory Visit
Admission: RE | Admit: 2022-07-15 | Discharge: 2022-07-15 | Disposition: A | Discharge status: Home / Self Care | Payer: 59 | Source: Ambulatory Visit | Attending: Internal Medicine | Admitting: Internal Medicine

## 2022-07-15 ENCOUNTER — Ambulatory Visit (INDEPENDENT_AMBULATORY_CARE_PROVIDER_SITE_OTHER): Payer: 59 | Admitting: Internal Medicine

## 2022-07-15 VITALS — BP 120/72 | HR 54 | Temp 98.2°F | Ht 65.0 in | Wt 126.0 lb

## 2022-07-15 DIAGNOSIS — Z7984 Long term (current) use of oral hypoglycemic drugs: Secondary | ICD-10-CM

## 2022-07-15 DIAGNOSIS — E538 Deficiency of other specified B group vitamins: Secondary | ICD-10-CM | POA: Diagnosis not present

## 2022-07-15 DIAGNOSIS — E1165 Type 2 diabetes mellitus with hyperglycemia: Secondary | ICD-10-CM

## 2022-07-15 DIAGNOSIS — Z0001 Encounter for general adult medical examination with abnormal findings: Secondary | ICD-10-CM

## 2022-07-15 DIAGNOSIS — Z87891 Personal history of nicotine dependence: Secondary | ICD-10-CM

## 2022-07-15 DIAGNOSIS — I10 Essential (primary) hypertension: Secondary | ICD-10-CM

## 2022-07-15 DIAGNOSIS — E782 Mixed hyperlipidemia: Secondary | ICD-10-CM

## 2022-07-15 DIAGNOSIS — F17210 Nicotine dependence, cigarettes, uncomplicated: Secondary | ICD-10-CM | POA: Diagnosis not present

## 2022-07-15 DIAGNOSIS — E559 Vitamin D deficiency, unspecified: Secondary | ICD-10-CM | POA: Diagnosis not present

## 2022-07-15 DIAGNOSIS — Z122 Encounter for screening for malignant neoplasm of respiratory organs: Secondary | ICD-10-CM

## 2022-07-15 DIAGNOSIS — Z0000 Encounter for general adult medical examination without abnormal findings: Secondary | ICD-10-CM

## 2022-07-15 DIAGNOSIS — F17200 Nicotine dependence, unspecified, uncomplicated: Secondary | ICD-10-CM | POA: Diagnosis not present

## 2022-07-15 LAB — HEMOGLOBIN A1C: Hgb A1c MFr Bld: 6.4 % (ref 4.6–6.5)

## 2022-07-15 LAB — BASIC METABOLIC PANEL
BUN: 17 mg/dL (ref 6–23)
CO2: 28 mEq/L (ref 19–32)
Calcium: 9.6 mg/dL (ref 8.4–10.5)
Chloride: 104 mEq/L (ref 96–112)
Creatinine, Ser: 1.06 mg/dL (ref 0.40–1.20)
GFR: 57.52 mL/min — ABNORMAL LOW (ref >60.00)
Glucose, Bld: 94 mg/dL (ref 70–99)
Potassium: 3.3 mEq/L — ABNORMAL LOW (ref 3.5–5.1)
Sodium: 140 mEq/L (ref 135–145)

## 2022-07-15 LAB — CBC WITH DIFFERENTIAL/PLATELET
Basophils Absolute: 0 10*3/uL (ref 0.0–0.1)
Basophils Relative: 0.4 % (ref 0.0–3.0)
Eosinophils Absolute: 0.1 10*3/uL (ref 0.0–0.7)
Eosinophils Relative: 2.3 % (ref 0.0–5.0)
HCT: 39.3 % (ref 36.0–46.0)
Hemoglobin: 12.8 g/dL (ref 12.0–15.0)
Lymphocytes Relative: 39.6 % (ref 12.0–46.0)
Lymphs Abs: 2.4 10*3/uL (ref 0.7–4.0)
MCHC: 32.6 g/dL (ref 30.0–36.0)
MCV: 93.5 fl (ref 78.0–100.0)
Monocytes Absolute: 0.3 10*3/uL (ref 0.1–1.0)
Monocytes Relative: 4.7 % (ref 3.0–12.0)
Neutro Abs: 3.2 10*3/uL (ref 1.4–7.7)
Neutrophils Relative %: 53 % (ref 43.0–77.0)
Platelets: 226 10*3/uL (ref 150.0–400.0)
RBC: 4.21 Mil/uL (ref 3.87–5.11)
RDW: 14.7 % (ref 11.5–15.5)
WBC: 6 10*3/uL (ref 4.0–10.5)

## 2022-07-15 LAB — URINALYSIS, ROUTINE W REFLEX MICROSCOPIC
Bilirubin Urine: NEGATIVE
Hgb urine dipstick: NEGATIVE
Ketones, ur: NEGATIVE
Nitrite: NEGATIVE
Specific Gravity, Urine: 1.025 (ref 1.000–1.030)
Total Protein, Urine: NEGATIVE
Urine Glucose: NEGATIVE
Urobilinogen, UA: 0.2 (ref 0.0–1.0)
pH: 6 (ref 5.0–8.0)

## 2022-07-15 LAB — HEPATIC FUNCTION PANEL
ALT: 15 U/L (ref 0–35)
AST: 19 U/L (ref 0–37)
Albumin: 4.4 g/dL (ref 3.5–5.2)
Alkaline Phosphatase: 86 U/L (ref 39–117)
Bilirubin, Direct: 0.1 mg/dL (ref 0.0–0.3)
Total Bilirubin: 0.4 mg/dL (ref 0.2–1.2)
Total Protein: 7.4 g/dL (ref 6.0–8.3)

## 2022-07-15 LAB — VITAMIN D 25 HYDROXY (VIT D DEFICIENCY, FRACTURES): VITD: 34.27 ng/mL (ref 30.00–100.00)

## 2022-07-15 LAB — TSH: TSH: 0.8 u[IU]/mL (ref 0.35–5.50)

## 2022-07-15 LAB — LIPID PANEL
Cholesterol: 137 mg/dL (ref 0–200)
HDL: 57.8 mg/dL (ref >39.00)
LDL Cholesterol: 61 mg/dL (ref 0–99)
NonHDL: 79.39
Total CHOL/HDL Ratio: 2
Triglycerides: 91 mg/dL (ref 0.0–149.0)
VLDL: 18.2 mg/dL (ref 0.0–40.0)

## 2022-07-15 LAB — VITAMIN B12: Vitamin B-12: 524 pg/mL (ref 211–911)

## 2022-07-15 LAB — MICROALBUMIN / CREATININE URINE RATIO
Creatinine,U: 238.9 mg/dL
Microalb Creat Ratio: 1.5 mg/g (ref 0.0–30.0)
Microalb, Ur: 3.5 mg/dL — ABNORMAL HIGH (ref 0.0–1.9)
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
## 2022-07-15 NOTE — Patient Instructions (Addendum)
Please have your Shingrix (shingles) shots done at your local pharmacy.  Ok to stop the metformin  Please continue all other medications as before, including the jardiance  Please have the pharmacy call with any other refills you may need.  Please continue your efforts at being more active, low cholesterol diet, and weight control.  You are otherwise up to date with prevention measures today.  Please keep your appointments with your specialists as you may have planned  Please go to the LAB at the blood drawing area for the tests to be done  You will be contacted by phone if any changes need to be made immediately.  Otherwise, you will receive a letter about your results with an explanation, but please check with MyChart first.  Please remember to sign up for MyChart if you have not done so, as this will be important to you in the future with finding out test results, communicating by private email, and scheduling acute appointments online when needed.  Please make an Appointment to return in 6 months, or sooner if needed

## 2022-07-15 NOTE — Progress Notes (Signed)
The test results show that your current treatment is OK, as the tests are stable.  Please continue the same plan.  There is no other need for change of treatment or further evaluation based on these results, at this time.  thanks 

## 2022-07-16 ENCOUNTER — Encounter: Payer: Self-pay | Admitting: Internal Medicine

## 2022-07-16 DIAGNOSIS — M5416 Radiculopathy, lumbar region: Secondary | ICD-10-CM | POA: Diagnosis not present

## 2022-07-16 NOTE — Assessment & Plan Note (Signed)
Pt counseled to quit, pt not ready 

## 2022-07-16 NOTE — Assessment & Plan Note (Signed)
Lab Results  Component Value Date   LDLCALC 61 07/15/2022   Stable, pt to continue current statin liptor 80 qd

## 2022-07-16 NOTE — Assessment & Plan Note (Signed)
Lab Results  Component Value Date   HGBA1C 6.4 07/15/2022   Now overcontrolled, pt to continue current medical treatment jardiance 10 qd, but d/c metformin 500 qd

## 2022-07-16 NOTE — Assessment & Plan Note (Signed)
Last vitamin D Lab Results  Component Value Date   VD25OH 34.27 07/15/2022   Low, to start  oral replacement

## 2022-07-16 NOTE — Assessment & Plan Note (Signed)
BP Readings from Last 3 Encounters:  07/15/22 120/72  06/09/22 (!) 167/86  01/14/22 (!) 142/90   Stable, pt to continue medical treatment norvasc 10 qd bidil, losartan 100 qd, toprol xl 100 qd

## 2022-07-16 NOTE — Assessment & Plan Note (Signed)
Age and sex appropriate education and counseling updated with regular exercise and diet Referrals for preventative services - pt to call for own eye exam Immunizations addressed - for shingrix at pharmacy Smoking counseling  - pt counsled to quit, pt not ready Evidence for depression or other mood disorder - none significant Most recent labs reviewed. I have personally reviewed and have noted: 1) the patient's medical and social history 2) The patient's current medications and supplements 3) The patient's height, weight, and BMI have been recorded in the chart

## 2022-07-18 ENCOUNTER — Other Ambulatory Visit: Payer: Self-pay | Admitting: Gastroenterology

## 2022-07-18 ENCOUNTER — Other Ambulatory Visit: Payer: Self-pay | Admitting: Internal Medicine

## 2022-07-18 DIAGNOSIS — K219 Gastro-esophageal reflux disease without esophagitis: Secondary | ICD-10-CM

## 2022-07-18 DIAGNOSIS — I10 Essential (primary) hypertension: Secondary | ICD-10-CM

## 2022-07-20 ENCOUNTER — Other Ambulatory Visit: Payer: Self-pay | Admitting: Acute Care

## 2022-07-20 ENCOUNTER — Other Ambulatory Visit: Payer: Self-pay

## 2022-07-20 ENCOUNTER — Other Ambulatory Visit (HOSPITAL_COMMUNITY): Payer: Self-pay

## 2022-07-20 DIAGNOSIS — F17210 Nicotine dependence, cigarettes, uncomplicated: Secondary | ICD-10-CM

## 2022-07-20 DIAGNOSIS — Z122 Encounter for screening for malignant neoplasm of respiratory organs: Secondary | ICD-10-CM

## 2022-07-20 DIAGNOSIS — Z87891 Personal history of nicotine dependence: Secondary | ICD-10-CM

## 2022-07-20 MED ORDER — ONDANSETRON HCL 4 MG PO TABS
4.0000 mg | ORAL_TABLET | Freq: Three times a day (TID) | ORAL | 0 refills | Status: DC | PRN
  Filled 2022-07-20: qty 30, 10d supply, fill #0

## 2022-07-20 MED ORDER — APLISOL 5 UNIT/0.1ML ID SOLN
5.0000 [IU] | Freq: Once | INTRADERMAL | 0 refills | Status: AC
  Filled 2022-07-20: qty 0.1, 1d supply, fill #0

## 2022-07-20 MED ORDER — POLYSACCHARIDE IRON COMPLEX 150 MG PO CAPS
150.0000 mg | ORAL_CAPSULE | Freq: Every day | ORAL | 3 refills | Status: DC
  Filled 2022-07-20: qty 90, 90d supply, fill #0

## 2022-07-21 ENCOUNTER — Other Ambulatory Visit (HOSPITAL_COMMUNITY): Payer: Self-pay

## 2022-07-21 ENCOUNTER — Encounter: Payer: Self-pay | Admitting: Pharmacist

## 2022-07-21 ENCOUNTER — Other Ambulatory Visit: Payer: Self-pay

## 2022-07-24 ENCOUNTER — Other Ambulatory Visit: Payer: Self-pay

## 2022-07-27 ENCOUNTER — Telehealth: Payer: Self-pay | Admitting: Internal Medicine

## 2022-07-27 ENCOUNTER — Other Ambulatory Visit: Payer: Self-pay | Admitting: Internal Medicine

## 2022-07-27 DIAGNOSIS — E119 Type 2 diabetes mellitus without complications: Secondary | ICD-10-CM

## 2022-07-27 DIAGNOSIS — I209 Angina pectoris, unspecified: Secondary | ICD-10-CM

## 2022-07-27 DIAGNOSIS — G629 Polyneuropathy, unspecified: Secondary | ICD-10-CM

## 2022-07-27 DIAGNOSIS — F419 Anxiety disorder, unspecified: Secondary | ICD-10-CM

## 2022-07-27 DIAGNOSIS — I10 Essential (primary) hypertension: Secondary | ICD-10-CM

## 2022-07-27 NOTE — Telephone Encounter (Signed)
Patient called states she forgot at her appointment on 07/15/22 to request refills be sent for her medications. She asked that refills could be sent in for her on all her medications.

## 2022-07-29 ENCOUNTER — Encounter: Payer: Self-pay | Admitting: Internal Medicine

## 2022-07-29 DIAGNOSIS — L989 Disorder of the skin and subcutaneous tissue, unspecified: Secondary | ICD-10-CM

## 2022-07-29 MED ORDER — ALBUTEROL SULFATE HFA 108 (90 BASE) MCG/ACT IN AERS: 2.0000 | INHALATION_SPRAY | Freq: Four times a day (QID) | RESPIRATORY_TRACT | 5 refills | Status: DC | PRN

## 2022-07-29 MED ORDER — METFORMIN HCL 500 MG PO TABS
500.0000 mg | ORAL_TABLET | Freq: Every day | ORAL | 1 refills | Status: DC
Start: 2022-07-29 — End: 2023-02-12

## 2022-07-29 MED ORDER — BUPROPION HCL 75 MG PO TABS
75.0000 mg | ORAL_TABLET | Freq: Two times a day (BID) | ORAL | 1 refills | Status: DC
Start: 2022-07-29 — End: 2023-02-12

## 2022-07-29 MED ORDER — ATORVASTATIN CALCIUM 80 MG PO TABS: 80.0000 mg | ORAL_TABLET | Freq: Every day | ORAL | 1 refills | Status: DC

## 2022-07-29 MED ORDER — SERTRALINE HCL 100 MG PO TABS
100.0000 mg | ORAL_TABLET | Freq: Every day | ORAL | 1 refills | Status: DC
Start: 2022-07-29 — End: 2023-12-29

## 2022-07-29 MED ORDER — LOSARTAN POTASSIUM 100 MG PO TABS
100.0000 mg | ORAL_TABLET | Freq: Every day | ORAL | 1 refills | Status: DC
Start: 2022-07-29 — End: 2023-02-12

## 2022-07-29 MED ORDER — AMLODIPINE BESYLATE 10 MG PO TABS
10.0000 mg | ORAL_TABLET | Freq: Every day | ORAL | 1 refills | Status: DC
Start: 2022-07-29 — End: 2023-02-12

## 2022-07-29 MED ORDER — METOPROLOL SUCCINATE ER 100 MG PO TB24
ORAL_TABLET | ORAL | 1 refills | Status: DC
Start: 2022-07-29 — End: 2022-07-30

## 2022-07-29 NOTE — Telephone Encounter (Signed)
Pt had cpx on 07/15/22. Called pt no answer LMOM refills has been sent to [facility].Marland KitchenRaechel Chute

## 2022-07-30 ENCOUNTER — Ambulatory Visit
Admission: RE | Admit: 2022-07-30 | Discharge: 2022-07-30 | Disposition: A | Discharge status: Home / Self Care | Payer: 59 | Source: Ambulatory Visit | Attending: Internal Medicine | Admitting: Internal Medicine

## 2022-07-30 ENCOUNTER — Telehealth: Payer: Self-pay | Admitting: Internal Medicine

## 2022-07-30 DIAGNOSIS — I209 Angina pectoris, unspecified: Secondary | ICD-10-CM

## 2022-07-30 DIAGNOSIS — Z1231 Encounter for screening mammogram for malignant neoplasm of breast: Secondary | ICD-10-CM | POA: Diagnosis not present

## 2022-07-30 MED ORDER — METOPROLOL SUCCINATE ER 100 MG PO TB24
100.0000 mg | ORAL_TABLET | Freq: Every day | ORAL | 1 refills | Status: DC
Start: 2022-07-30 — End: 2023-10-11

## 2022-07-30 NOTE — Telephone Encounter (Signed)
Sent rx w/ instruction on metoprolol to [facility]..Gordan Payment

## 2022-07-30 NOTE — Telephone Encounter (Signed)
Pharmacy need frequency on medication for metoprolol succinate (TOPROL-XL) 100 MG 24 hr tablet. Please advise.

## 2022-07-31 ENCOUNTER — Other Ambulatory Visit: Payer: Self-pay | Admitting: Cardiology

## 2022-08-11 ENCOUNTER — Other Ambulatory Visit: Payer: Self-pay | Admitting: Gastroenterology

## 2022-08-11 DIAGNOSIS — K219 Gastro-esophageal reflux disease without esophagitis: Secondary | ICD-10-CM

## 2022-09-02 DIAGNOSIS — L905 Scar conditions and fibrosis of skin: Secondary | ICD-10-CM | POA: Diagnosis not present

## 2022-09-03 ENCOUNTER — Other Ambulatory Visit: Payer: Self-pay | Admitting: Cardiology

## 2022-09-03 DIAGNOSIS — I10 Essential (primary) hypertension: Secondary | ICD-10-CM

## 2022-09-03 DIAGNOSIS — E119 Type 2 diabetes mellitus without complications: Secondary | ICD-10-CM

## 2022-09-09 ENCOUNTER — Ambulatory Visit: Admitting: Radiology

## 2022-09-20 ENCOUNTER — Other Ambulatory Visit: Payer: Self-pay | Admitting: Internal Medicine

## 2022-09-30 ENCOUNTER — Ambulatory Visit: Admitting: Radiology

## 2022-10-07 ENCOUNTER — Ambulatory Visit (INDEPENDENT_AMBULATORY_CARE_PROVIDER_SITE_OTHER): Payer: 59 | Admitting: Radiology

## 2022-10-07 ENCOUNTER — Telehealth: Payer: Self-pay

## 2022-10-07 VITALS — BP 116/72

## 2022-10-07 DIAGNOSIS — N958 Other specified menopausal and perimenopausal disorders: Secondary | ICD-10-CM | POA: Diagnosis not present

## 2022-10-07 MED ORDER — IMVEXXY MAINTENANCE PACK 10 MCG VA INST
1.0000 | VAGINAL_INSERT | VAGINAL | 11 refills | Status: DC
Start: 2022-10-08 — End: 2023-08-10
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
## 2022-10-07 NOTE — Telephone Encounter (Signed)
PA approved. Patient informed

## 2022-10-07 NOTE — Telephone Encounter (Signed)
PA initiated through covermymeds for Imvexxy. KEY: [id number] Dx: vaginal atrophy

## 2022-10-26 ENCOUNTER — Other Ambulatory Visit: Payer: Self-pay

## 2022-10-26 ENCOUNTER — Other Ambulatory Visit: Payer: Self-pay | Admitting: Internal Medicine

## 2022-11-02 DIAGNOSIS — M5416 Radiculopathy, lumbar region: Secondary | ICD-10-CM | POA: Diagnosis not present

## 2022-11-02 DIAGNOSIS — M47817 Spondylosis without myelopathy or radiculopathy, lumbosacral region: Secondary | ICD-10-CM | POA: Diagnosis not present

## 2022-11-02 DIAGNOSIS — M7918 Myalgia, other site: Secondary | ICD-10-CM | POA: Diagnosis not present

## 2022-11-02 DIAGNOSIS — Z5181 Encounter for therapeutic drug level monitoring: Secondary | ICD-10-CM | POA: Diagnosis not present

## 2022-11-02 DIAGNOSIS — Z79899 Other long term (current) drug therapy: Secondary | ICD-10-CM | POA: Diagnosis not present

## 2022-11-02 DIAGNOSIS — M5136 Other intervertebral disc degeneration, lumbar region: Secondary | ICD-10-CM | POA: Diagnosis not present

## 2022-11-02 DIAGNOSIS — M542 Cervicalgia: Secondary | ICD-10-CM | POA: Diagnosis not present

## 2022-12-03 DIAGNOSIS — M5416 Radiculopathy, lumbar region: Secondary | ICD-10-CM | POA: Diagnosis not present

## 2022-12-10 ENCOUNTER — Ambulatory Visit: Payer: 59 | Admitting: Cardiology

## 2022-12-14 ENCOUNTER — Ambulatory Visit: Payer: 59 | Admitting: Cardiology

## 2023-01-18 ENCOUNTER — Ambulatory Visit: Payer: 59 | Admitting: Internal Medicine

## 2023-02-12 ENCOUNTER — Encounter: Payer: Self-pay | Admitting: Internal Medicine

## 2023-02-12 ENCOUNTER — Ambulatory Visit (INDEPENDENT_AMBULATORY_CARE_PROVIDER_SITE_OTHER): Payer: 59

## 2023-02-12 ENCOUNTER — Ambulatory Visit (INDEPENDENT_AMBULATORY_CARE_PROVIDER_SITE_OTHER): Payer: 59 | Admitting: Internal Medicine

## 2023-02-12 VITALS — BP 110/64 | HR 76 | Temp 98.7°F | Ht 65.0 in | Wt 117.0 lb

## 2023-02-12 DIAGNOSIS — R1310 Dysphagia, unspecified: Secondary | ICD-10-CM

## 2023-02-12 DIAGNOSIS — F32A Depression, unspecified: Secondary | ICD-10-CM

## 2023-02-12 DIAGNOSIS — I10 Essential (primary) hypertension: Secondary | ICD-10-CM | POA: Diagnosis not present

## 2023-02-12 DIAGNOSIS — R634 Abnormal weight loss: Secondary | ICD-10-CM | POA: Diagnosis not present

## 2023-02-12 DIAGNOSIS — Z23 Encounter for immunization: Secondary | ICD-10-CM | POA: Diagnosis not present

## 2023-02-12 DIAGNOSIS — F419 Anxiety disorder, unspecified: Secondary | ICD-10-CM

## 2023-02-12 DIAGNOSIS — F17210 Nicotine dependence, cigarettes, uncomplicated: Secondary | ICD-10-CM | POA: Diagnosis not present

## 2023-02-12 DIAGNOSIS — G629 Polyneuropathy, unspecified: Secondary | ICD-10-CM

## 2023-02-12 DIAGNOSIS — Z7984 Long term (current) use of oral hypoglycemic drugs: Secondary | ICD-10-CM

## 2023-02-12 DIAGNOSIS — F17200 Nicotine dependence, unspecified, uncomplicated: Secondary | ICD-10-CM | POA: Diagnosis not present

## 2023-02-12 DIAGNOSIS — K219 Gastro-esophageal reflux disease without esophagitis: Secondary | ICD-10-CM

## 2023-02-12 DIAGNOSIS — E119 Type 2 diabetes mellitus without complications: Secondary | ICD-10-CM

## 2023-02-12 LAB — HEPATIC FUNCTION PANEL
ALT: 13 U/L (ref 0–35)
AST: 21 U/L (ref 0–37)
Albumin: 4.5 g/dL (ref 3.5–5.2)
Alkaline Phosphatase: 85 U/L (ref 39–117)
Bilirubin, Direct: 0.2 mg/dL (ref 0.0–0.3)
Total Bilirubin: 0.8 mg/dL (ref 0.2–1.2)
Total Protein: 7.2 g/dL (ref 6.0–8.3)

## 2023-02-12 LAB — URINALYSIS, ROUTINE W REFLEX MICROSCOPIC
Bilirubin Urine: NEGATIVE
Hgb urine dipstick: NEGATIVE
Ketones, ur: NEGATIVE
Nitrite: POSITIVE — AB
Specific Gravity, Urine: 1.02 (ref 1.000–1.030)
Urine Glucose: NEGATIVE
Urobilinogen, UA: 0.2 (ref 0.0–1.0)
pH: 6 (ref 5.0–8.0)

## 2023-02-12 LAB — CBC WITH DIFFERENTIAL/PLATELET
Basophils Absolute: 0 10*3/uL (ref 0.0–0.1)
Basophils Relative: 0.6 % (ref 0.0–3.0)
Eosinophils Absolute: 0.2 10*3/uL (ref 0.0–0.7)
Eosinophils Relative: 2.9 % (ref 0.0–5.0)
HCT: 41.8 % (ref 36.0–46.0)
Hemoglobin: 14 g/dL (ref 12.0–15.0)
Lymphocytes Relative: 29.5 % (ref 12.0–46.0)
Lymphs Abs: 2.1 10*3/uL (ref 0.7–4.0)
MCHC: 33.5 g/dL (ref 30.0–36.0)
MCV: 96.9 fL (ref 78.0–100.0)
Monocytes Absolute: 0.4 10*3/uL (ref 0.1–1.0)
Monocytes Relative: 5.6 % (ref 3.0–12.0)
Neutro Abs: 4.4 10*3/uL (ref 1.4–7.7)
Neutrophils Relative %: 61.4 % (ref 43.0–77.0)
Platelets: 273 10*3/uL (ref 150.0–400.0)
RBC: 4.31 Mil/uL (ref 3.87–5.11)
RDW: 14.4 % (ref 11.5–15.5)
WBC: 7.2 10*3/uL (ref 4.0–10.5)

## 2023-02-12 LAB — LIPID PANEL
Cholesterol: 229 mg/dL — ABNORMAL HIGH (ref 0–200)
HDL: 87.9 mg/dL (ref >39.00)
LDL Cholesterol: 100 mg/dL — ABNORMAL HIGH (ref 0–99)
NonHDL: 141.24
Total CHOL/HDL Ratio: 3
Triglycerides: 205 mg/dL — ABNORMAL HIGH (ref 0.0–149.0)
VLDL: 41 mg/dL — ABNORMAL HIGH (ref 0.0–40.0)

## 2023-02-12 LAB — TSH: TSH: 1.54 u[IU]/mL (ref 0.35–5.50)

## 2023-02-12 LAB — BASIC METABOLIC PANEL
BUN: 19 mg/dL (ref 6–23)
CO2: 25 meq/L (ref 19–32)
Calcium: 9.3 mg/dL (ref 8.4–10.5)
Chloride: 102 meq/L (ref 96–112)
Creatinine, Ser: 1.17 mg/dL (ref 0.40–1.20)
GFR: 50.89 mL/min — ABNORMAL LOW (ref >60.00)
Glucose, Bld: 114 mg/dL — ABNORMAL HIGH (ref 70–99)
Potassium: 3.2 meq/L — ABNORMAL LOW (ref 3.5–5.1)
Sodium: 137 meq/L (ref 135–145)

## 2023-02-12 LAB — HEMOGLOBIN A1C: Hgb A1c MFr Bld: 6.2 % (ref 4.6–6.5)

## 2023-02-12 MED ORDER — AMLODIPINE BESYLATE 10 MG PO TABS
10.0000 mg | ORAL_TABLET | Freq: Every day | ORAL | 2 refills | Status: DC
Start: 2023-02-12 — End: 2023-08-10
  Filled 2023-05-18: qty 90, 90d supply, fill #0
  Filled 2023-08-10: qty 90, 90d supply, fill #1

## 2023-02-12 MED ORDER — BUPROPION HCL 75 MG PO TABS
75.0000 mg | ORAL_TABLET | Freq: Two times a day (BID) | ORAL | 2 refills | Status: DC
  Filled 2023-05-18: qty 180, 90d supply, fill #0
  Filled 2023-08-10 (×2): qty 180, 90d supply, fill #1
  Filled 2023-11-08: qty 180, 90d supply, fill #2

## 2023-02-12 MED ORDER — ONDANSETRON HCL 4 MG PO TABS
4.0000 mg | ORAL_TABLET | Freq: Three times a day (TID) | ORAL | 1 refills | Status: DC | PRN
  Filled 2023-05-18: qty 30, 10d supply, fill #0
  Filled 2023-06-30: qty 30, 10d supply, fill #1

## 2023-02-12 MED ORDER — ATORVASTATIN CALCIUM 80 MG PO TABS
80.0000 mg | ORAL_TABLET | Freq: Every day | ORAL | 2 refills | Status: DC
  Filled 2023-05-18: qty 90, 90d supply, fill #0
  Filled 2023-08-10 (×2): qty 90, 90d supply, fill #1
  Filled 2023-11-08: qty 90, 90d supply, fill #2

## 2023-02-12 MED ORDER — LOSARTAN POTASSIUM 100 MG PO TABS
100.0000 mg | ORAL_TABLET | Freq: Every day | ORAL | 2 refills | Status: DC
  Filled 2023-05-18: qty 90, 90d supply, fill #0

## 2023-02-12 MED ORDER — DIAZEPAM 5 MG PO TABS: ORAL_TABLET | ORAL | 2 refills | Status: DC

## 2023-02-12 MED ORDER — GABAPENTIN 300 MG PO CAPS
300.0000 mg | ORAL_CAPSULE | Freq: Three times a day (TID) | ORAL | 0 refills | Status: DC
Start: 2023-02-12 — End: 2023-08-10
  Filled 2023-05-18: qty 270, 90d supply, fill #0

## 2023-02-12 MED ORDER — ESOMEPRAZOLE MAGNESIUM 40 MG PO CPDR
40.0000 mg | DELAYED_RELEASE_CAPSULE | Freq: Every day | ORAL | 2 refills | Status: DC
  Filled 2023-05-18: qty 90, 90d supply, fill #0
  Filled 2023-08-10 (×2): qty 90, 90d supply, fill #1
  Filled 2023-11-08: qty 90, 90d supply, fill #2

## 2023-02-12 NOTE — Patient Instructions (Signed)
Ok to take the Boost up to 4 cans per day  You will be contacted regarding the referral for: Nutrition  Ok to increase the Nexium (esomeprazole) to 40 mg per day  Ok to STOP the metformin  Please quit smoking  Please continue all other medications as before, and refills have been done if requested.  Please have the pharmacy call with any other refills you may need.  Please continue your efforts at being more active, low cholesterol diet, and weight control.  You are otherwise up to date with prevention measures today.  Please keep your appointments with your specialists as you may have planned - Dr Shon Baton for the neck spine spurs  Please go to the XRAY Department in the first floor for the x-ray testing  Please go to the LAB at the blood drawing area for the tests to be done  Please make an Appointment to return in 4 months, or sooner if needed
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
## 2023-02-12 NOTE — Progress Notes (Signed)
The test results show that your current treatment is OK, as the tests are stable.  Please continue the same plan.  There is no other need for change of treatment or further evaluation based on these results, at this time.  thanks 

## 2023-02-14 ENCOUNTER — Encounter: Payer: Self-pay | Admitting: Internal Medicine

## 2023-02-14 DIAGNOSIS — R1310 Dysphagia, unspecified: Secondary | ICD-10-CM | POA: Insufficient documentation

## 2023-02-14 NOTE — Assessment & Plan Note (Signed)
Pt declines GI referral for now, to f/u with ortho as planned

## 2023-02-14 NOTE — Assessment & Plan Note (Signed)
Pt urged to quit smoking, not ready to quit

## 2023-02-14 NOTE — Assessment & Plan Note (Addendum)
Also for cxr and labs as ordered,  to f/u any worsening symptoms or concerns, for boost asd, refer nutrition

## 2023-02-14 NOTE — Assessment & Plan Note (Signed)
BP Readings from Last 3 Encounters:  02/12/23 110/64  10/07/22 116/72  07/15/22 120/72   Stable, pt to continue medical treatment bidil 20 37.5, toprol xl 100 every day, norvasc 10 every day, losartan 100 qd

## 2023-02-14 NOTE — Assessment & Plan Note (Signed)
Uncontrolled, for increased nexium 40 qd

## 2023-02-14 NOTE — Assessment & Plan Note (Signed)
Lab Results  Component Value Date   HGBA1C 6.2 02/12/2023   Low normal with recent wt loss, pt to continue current medical treatment jardiance 10 mg every day, ok to d/c metformin with recent wt loss

## 2023-02-24 ENCOUNTER — Ambulatory Visit: Payer: 59 | Admitting: Radiology

## 2023-03-03 ENCOUNTER — Ambulatory Visit (INDEPENDENT_AMBULATORY_CARE_PROVIDER_SITE_OTHER): Payer: 59 | Admitting: Radiology

## 2023-03-03 VITALS — BP 122/74 | HR 92

## 2023-03-03 DIAGNOSIS — N898 Other specified noninflammatory disorders of vagina: Secondary | ICD-10-CM | POA: Diagnosis not present

## 2023-03-03 DIAGNOSIS — Z113 Encounter for screening for infections with a predominantly sexual mode of transmission: Secondary | ICD-10-CM

## 2023-03-03 LAB — WET PREP FOR TRICH, YEAST, CLUE
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
## 2023-03-04 DIAGNOSIS — M5030 Other cervical disc degeneration, unspecified cervical region: Secondary | ICD-10-CM | POA: Diagnosis not present

## 2023-03-04 DIAGNOSIS — M5412 Radiculopathy, cervical region: Secondary | ICD-10-CM | POA: Diagnosis not present

## 2023-03-04 DIAGNOSIS — M51362 Other intervertebral disc degeneration, lumbar region with discogenic back pain and lower extremity pain: Secondary | ICD-10-CM | POA: Diagnosis not present

## 2023-03-04 DIAGNOSIS — M5416 Radiculopathy, lumbar region: Secondary | ICD-10-CM | POA: Diagnosis not present

## 2023-03-04 DIAGNOSIS — R296 Repeated falls: Secondary | ICD-10-CM | POA: Insufficient documentation

## 2023-03-04 DIAGNOSIS — M791 Myalgia, unspecified site: Secondary | ICD-10-CM | POA: Diagnosis not present

## 2023-03-04 DIAGNOSIS — Z79899 Other long term (current) drug therapy: Secondary | ICD-10-CM | POA: Diagnosis not present

## 2023-03-04 DIAGNOSIS — M542 Cervicalgia: Secondary | ICD-10-CM | POA: Diagnosis not present

## 2023-03-04 DIAGNOSIS — Z5181 Encounter for therapeutic drug level monitoring: Secondary | ICD-10-CM | POA: Diagnosis not present

## 2023-03-04 DIAGNOSIS — R1310 Dysphagia, unspecified: Secondary | ICD-10-CM | POA: Diagnosis not present

## 2023-03-04 DIAGNOSIS — G894 Chronic pain syndrome: Secondary | ICD-10-CM | POA: Diagnosis not present

## 2023-03-05 ENCOUNTER — Other Ambulatory Visit: Payer: Self-pay | Admitting: Cardiology

## 2023-03-05 ENCOUNTER — Other Ambulatory Visit: Payer: Self-pay | Admitting: Internal Medicine

## 2023-03-05 ENCOUNTER — Other Ambulatory Visit: Payer: Self-pay

## 2023-03-05 DIAGNOSIS — I10 Essential (primary) hypertension: Secondary | ICD-10-CM

## 2023-03-05 DIAGNOSIS — E119 Type 2 diabetes mellitus without complications: Secondary | ICD-10-CM

## 2023-03-05 LAB — SURESWAB CT/NG/T. VAGINALIS
C. trachomatis RNA, TMA: NOT DETECTED
N. gonorrhoeae RNA, TMA: NOT DETECTED
Trichomonas vaginalis RNA: NOT DETECTED

## 2023-03-07 ENCOUNTER — Other Ambulatory Visit: Payer: Self-pay | Admitting: Radiology

## 2023-03-09 DIAGNOSIS — M5416 Radiculopathy, lumbar region: Secondary | ICD-10-CM | POA: Diagnosis not present

## 2023-03-09 NOTE — Telephone Encounter (Signed)
Message sent to scheduling department to call patient to schedule aex.

## 2023-03-09 NOTE — Telephone Encounter (Signed)
Will approve but needs AEX

## 2023-03-09 NOTE — Telephone Encounter (Signed)
Medication refill request: Dotti patch 0.05mg  Last AEX:  07-24-21 Last OV: 03-03-23 Next AEX: not scheduled Last MMG (if hormonal medication request): 07-30-22 birads 1:neg Refill authorized: please approve or deny as appropriate

## 2023-04-03 ENCOUNTER — Other Ambulatory Visit: Payer: Self-pay | Admitting: Internal Medicine

## 2023-04-05 ENCOUNTER — Other Ambulatory Visit: Payer: Self-pay

## 2023-04-14 ENCOUNTER — Telehealth: Payer: Self-pay | Admitting: Internal Medicine

## 2023-04-14 NOTE — Telephone Encounter (Signed)
 Copied from CRM [id number]. Topic: Clinical - Medication Question >> Apr 14, 2023  2:31 PM Deaijah H wrote: Reason for CRM: Education officer, environmental [employer] faxed 2/14 requesting Dr. Jonny Ruiz to deprescribed recommendation request for diazepam (VALIUM) 5 MG table. Please call [phone] or fax back [phone] sign and dated.

## 2023-04-21 NOTE — Telephone Encounter (Signed)
 Copied from CRM [id number]. Topic: General - Other >> Apr 21, 2023  3:47 PM Fredrich Romans wrote: Reason for CRM: [facility] faxed 2/14 requesting Dr. Jonny Ruiz to deprescribed recommendation request for diazepam (VALIUM) 5 MG table. Please call [phone] or fax back [phone] sign and dated. She stated that they havent received form back yet via fax.She said that a phone call would be okay as well.Whichever method that's easier for provider or medical assistant.

## 2023-04-23 NOTE — Telephone Encounter (Signed)
 Form received today and placed on provider desk.

## 2023-05-17 ENCOUNTER — Other Ambulatory Visit (HOSPITAL_COMMUNITY): Payer: Self-pay

## 2023-05-18 ENCOUNTER — Other Ambulatory Visit (HOSPITAL_COMMUNITY): Payer: Self-pay

## 2023-05-18 ENCOUNTER — Other Ambulatory Visit: Payer: Self-pay

## 2023-05-18 MED ORDER — BACLOFEN 10 MG PO TABS
10.0000 mg | ORAL_TABLET | Freq: Three times a day (TID) | ORAL | 1 refills | Status: AC | PRN
  Filled 2023-05-18: qty 90, 30d supply, fill #0
  Filled 2023-10-19: qty 90, 30d supply, fill #1

## 2023-05-18 MED FILL — Clopidogrel Bisulfate Tab 75 MG (Base Equiv): ORAL | 30 days supply | Qty: 30 | Fill #0 | Status: AC

## 2023-05-18 MED FILL — Ketoconazole Cream 2%: CUTANEOUS | 30 days supply | Qty: 30 | Fill #0 | Status: AC

## 2023-05-18 MED FILL — Empagliflozin Tab 10 MG: ORAL | 90 days supply | Qty: 90 | Fill #0 | Status: AC

## 2023-05-24 ENCOUNTER — Other Ambulatory Visit (HOSPITAL_COMMUNITY): Payer: Self-pay

## 2023-05-24 ENCOUNTER — Ambulatory Visit (INDEPENDENT_AMBULATORY_CARE_PROVIDER_SITE_OTHER): Payer: 59

## 2023-05-24 VITALS — Ht 65.0 in | Wt 117.0 lb

## 2023-05-24 DIAGNOSIS — Z0000 Encounter for general adult medical examination without abnormal findings: Secondary | ICD-10-CM

## 2023-05-24 DIAGNOSIS — Z122 Encounter for screening for malignant neoplasm of respiratory organs: Secondary | ICD-10-CM | POA: Diagnosis not present

## 2023-05-24 DIAGNOSIS — F17200 Nicotine dependence, unspecified, uncomplicated: Secondary | ICD-10-CM | POA: Diagnosis not present
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
## 2023-05-24 NOTE — Patient Instructions (Addendum)
 Ms. Krichbaum , Thank you for taking time to come for your Medicare Wellness Visit. I appreciate your ongoing commitment to your health goals. Please review the following plan we discussed and let me know if I can assist you in the future.   Referrals/Orders/Follow-Ups/Clinician Recommendations: Aim for 30 minutes of exercise or brisk walking, 6-8 glasses of water, and 5 servings of fruits and vegetables each day. Ordered a Lung Cancer Screening with GI.  Patient advised to follow up with Gynecologist due to abnormal testing from previous pap smear (from 2023).  This is a list of the screening recommended for you and due dates:  Health Maintenance  Topic Date Due   Zoster (Shingles) Vaccine (1 of 2) Never done   Screening for Lung Cancer  07/15/2023   Yearly kidney health urinalysis for diabetes  07/15/2023   Complete foot exam   07/15/2023   Eye exam for diabetics  07/27/2023   Hemoglobin A1C  08/13/2023   Flu Shot  09/17/2023   Yearly kidney function blood test for diabetes  02/12/2024   Medicare Annual Wellness Visit  05/23/2024   Mammogram  07/29/2024   Colon Cancer Screening  08/27/2024   Pap with HPV screening  07/25/2026   Pneumococcal Vaccination (3 of 3 - PPSV23 or PCV20) 01/31/2028   DTaP/Tdap/Td vaccine (3 - Td or Tdap) 04/01/2031   Hepatitis C Screening  Completed   HIV Screening  Completed   HPV Vaccine  Aged Out   COVID-19 Vaccine  Discontinued    Advanced directives: (Declined) Advance directive discussed with you today. Even though you declined this today, please call our office should you change your mind, and we can give you the proper paperwork for you to fill out.  Next Medicare Annual Wellness Visit scheduled for next year: Yes  Managing Pain Without Opioids Opioids are strong medicines used to treat moderate to severe pain. For some people, especially those who have long-term (chronic) pain, opioids may not be the best choice for pain management due to: Side  effects like nausea, constipation, and sleepiness. The risk of addiction (opioid use disorder). The longer you take opioids, the greater your risk of addiction. Pain that lasts for more than 3 months is called chronic pain. Managing chronic pain usually requires more than one approach and is often provided by a team of health care providers working together (multidisciplinary approach). Pain management may be done at a pain management center or pain clinic. How to manage pain without the use of opioids Use non-opioid medicines Non-opioid medicines for pain may include: Over-the-counter or prescription non-steroidal anti-inflammatory drugs (NSAIDs). These may be the first medicines used for pain. They work well for muscle and bone pain, and they reduce swelling. Acetaminophen. This over-the-counter medicine may work well for milder pain but not swelling. Antidepressants. These may be used to treat chronic pain. A certain type of antidepressant (tricyclics) is often used. These medicines are given in lower doses for pain than when used for depression. Anticonvulsants. These are usually used to treat seizures but may also reduce nerve (neuropathic) pain. Muscle relaxants. These relieve pain caused by sudden muscle tightening (spasms). You may also use a pain medicine that is applied to the skin as a patch, cream, or gel (topical analgesic), such as a numbing medicine. These may cause fewer side effects than medicines taken by mouth. Do certain therapies as directed Some therapies can help with pain management. They include: Physical therapy. You will do exercises to gain strength and  flexibility. A physical therapist may teach you exercises to move and stretch parts of your body that are weak, stiff, or painful. You can learn these exercises at physical therapy visits and practice them at home. Physical therapy may also involve: Massage. Heat wraps or applying heat or cold to affected areas. Electrical  signals that interrupt pain signals (transcutaneous electrical nerve stimulation, TENS). Weak lasers that reduce pain and swelling (low-level laser therapy). Signals from your body that help you learn to regulate pain (biofeedback). Occupational therapy. This helps you to learn ways to function at home and work with less pain. Recreational therapy. This involves trying new activities or hobbies, such as a physical activity or drawing. Mental health therapy, including: Cognitive behavioral therapy (CBT). This helps you learn coping skills for dealing with pain. Acceptance and commitment therapy (ACT) to change the way you think and react to pain. Relaxation therapies, including muscle relaxation exercises and mindfulness-based stress reduction. Pain management counseling. This may be individual, family, or group counseling.  Receive medical treatments Medical treatments for pain management include: Nerve block injections. These may include a pain blocker and anti-inflammatory medicines. You may have injections: Near the spine to relieve chronic back or neck pain. Into joints to relieve back or joint pain. Into nerve areas that supply a painful area to relieve body pain. Into muscles (trigger point injections) to relieve some painful muscle conditions. A medical device placed near your spine to help block pain signals and relieve nerve pain or chronic back pain (spinal cord stimulation device). Acupuncture. Follow these instructions at home Medicines Take over-the-counter and prescription medicines only as told by your health care provider. If you are taking pain medicine, ask your health care providers about possible side effects to watch out for. Do not drive or use heavy machinery while taking prescription opioid pain medicine. Lifestyle  Do not use drugs or alcohol to reduce pain. If you drink alcohol, limit how much you have to: 0-1 drink a day for women who are not pregnant. 0-2  drinks a day for men. Know how much alcohol is in a drink. In the [place] one drink equals one 12 oz bottle of beer (355 mL), one 5 oz glass of wine (148 mL), or one 1 oz glass of hard liquor (44 mL). Do not use any products that contain nicotine or tobacco. These products include cigarettes, chewing tobacco, and vaping devices, such as e-cigarettes. If you need help quitting, ask your health care provider. Eat a healthy diet and maintain a healthy weight. Poor diet and excess weight may make pain worse. Eat foods that are high in fiber. These include fresh fruits and vegetables, whole grains, and beans. Limit foods that are high in fat and processed sugars, such as fried and sweet foods. Exercise regularly. Exercise lowers stress and may help relieve pain. Ask your health care provider what activities and exercises are safe for you. If your health care provider approves, join an exercise class that combines movement and stress reduction. Examples include yoga and tai chi. Get enough sleep. Lack of sleep may make pain worse. Lower stress as much as possible. Practice stress reduction techniques as told by your therapist. General instructions Work with all your pain management providers to find the treatments that work best for you. You are an important member of your pain management team. There are many things you can do to reduce pain on your own. Consider joining an online or in-person support group for people who have  chronic pain. Keep all follow-up visits. This is important. Where to find more information You can find more information about managing pain without opioids from: American Academy of Pain Medicine: painmed.org Institute for Chronic Pain: instituteforchronicpain.org American Chronic Pain Association: theacpa.org Contact a health care provider if: You have side effects from pain medicine. Your pain gets worse or does not get better with treatments or home therapy. You are  struggling with anxiety or depression. Summary Many types of pain can be managed without opioids. Chronic pain may respond better to pain management without opioids. Pain is best managed when you and a team of health care providers work together. Pain management without opioids may include non-opioid medicines, medical treatments, physical therapy, mental health therapy, and lifestyle changes. Tell your health care providers if your pain gets worse or is not being managed well enough. This information is not intended to replace advice given to you by your health care provider. Make sure you discuss any questions you have with your health care provider. Document Revised: 05/15/2020 Document Reviewed: 05/15/2020 [employer] Patient Education  2024 [employer].

## 2023-05-25 ENCOUNTER — Other Ambulatory Visit (HOSPITAL_COMMUNITY): Payer: Self-pay

## 2023-05-25 DIAGNOSIS — K219 Gastro-esophageal reflux disease without esophagitis: Secondary | ICD-10-CM | POA: Diagnosis not present

## 2023-05-25 DIAGNOSIS — R490 Dysphonia: Secondary | ICD-10-CM | POA: Diagnosis not present

## 2023-05-25 DIAGNOSIS — J309 Allergic rhinitis, unspecified: Secondary | ICD-10-CM | POA: Diagnosis not present

## 2023-05-25 DIAGNOSIS — J381 Polyp of vocal cord and larynx: Secondary | ICD-10-CM | POA: Diagnosis not present

## 2023-05-25 MED ORDER — FLUTICASONE PROPIONATE 50 MCG/ACT NA SUSP
2.0000 | Freq: Every day | NASAL | 11 refills | Status: AC
  Filled 2023-05-25 – 2023-05-26 (×2): qty 16, 30d supply, fill #0
  Filled 2023-07-03: qty 16, 30d supply, fill #1
  Filled 2023-07-05 – 2023-08-02 (×2): qty 16, 30d supply, fill #2
  Filled 2023-09-09: qty 16, 30d supply, fill #3
  Filled 2023-10-04: qty 16, 30d supply, fill #4
  Filled 2023-11-02: qty 16, 30d supply, fill #5
  Filled 2023-12-02: qty 16, 30d supply, fill #6
  Filled 2023-12-26: qty 16, 30d supply, fill #7
  Filled 2024-01-25: qty 16, 30d supply, fill #8
  Filled 2024-02-23 – 2024-02-29 (×2): qty 16, 30d supply, fill #9

## 2023-05-26 ENCOUNTER — Other Ambulatory Visit (HOSPITAL_COMMUNITY): Payer: Self-pay

## 2023-05-26 ENCOUNTER — Other Ambulatory Visit: Payer: Self-pay

## 2023-06-08 ENCOUNTER — Ambulatory Visit: Attending: Cardiology | Admitting: Cardiology

## 2023-06-08 ENCOUNTER — Encounter: Payer: Self-pay | Admitting: Cardiology

## 2023-06-08 ENCOUNTER — Other Ambulatory Visit: Payer: Self-pay

## 2023-06-08 ENCOUNTER — Other Ambulatory Visit (HOSPITAL_COMMUNITY): Payer: Self-pay

## 2023-06-08 VITALS — BP 112/80 | HR 73 | Resp 16 | Ht 65.0 in | Wt 111.8 lb

## 2023-06-08 DIAGNOSIS — E782 Mixed hyperlipidemia: Secondary | ICD-10-CM | POA: Diagnosis not present

## 2023-06-08 DIAGNOSIS — F17210 Nicotine dependence, cigarettes, uncomplicated: Secondary | ICD-10-CM

## 2023-06-08 DIAGNOSIS — E119 Type 2 diabetes mellitus without complications: Secondary | ICD-10-CM | POA: Diagnosis not present

## 2023-06-08 DIAGNOSIS — I2510 Atherosclerotic heart disease of native coronary artery without angina pectoris: Secondary | ICD-10-CM | POA: Diagnosis not present

## 2023-06-08 DIAGNOSIS — I10 Essential (primary) hypertension: Secondary | ICD-10-CM

## 2023-06-08 DIAGNOSIS — I2584 Coronary atherosclerosis due to calcified coronary lesion: Secondary | ICD-10-CM | POA: Diagnosis not present

## 2023-06-08 MED ORDER — CLOPIDOGREL BISULFATE 75 MG PO TABS
75.0000 mg | ORAL_TABLET | Freq: Every day | ORAL | 3 refills | Status: AC
  Filled 2023-06-08 – 2023-06-14 (×2): qty 90, 90d supply, fill #0
  Filled 2023-09-09: qty 90, 90d supply, fill #1
  Filled 2023-12-08: qty 90, 90d supply, fill #2
  Filled 2024-03-02: qty 90, 90d supply, fill #3

## 2023-06-08 MED ORDER — LOSARTAN POTASSIUM 50 MG PO TABS
50.0000 mg | ORAL_TABLET | Freq: Every day | ORAL | 3 refills | Status: AC
  Filled 2023-06-08 (×2): qty 90, 90d supply, fill #0
  Filled 2023-08-30: qty 90, 90d supply, fill #1
  Filled 2023-11-28: qty 90, 90d supply, fill #2
  Filled 2024-02-26: qty 90, 90d supply, fill #3

## 2023-06-08 MED ORDER — ISOSORB DINITRATE-HYDRALAZINE 20-37.5 MG PO TABS
1.0000 | ORAL_TABLET | Freq: Two times a day (BID) | ORAL | 3 refills | Status: AC
  Filled 2023-06-08 (×2): qty 180, 90d supply, fill #0
  Filled 2023-08-30: qty 180, 90d supply, fill #1
  Filled 2023-11-28: qty 180, 90d supply, fill #2
  Filled 2024-02-26: qty 180, 90d supply, fill #3

## 2023-06-08 MED ORDER — EMPAGLIFLOZIN 10 MG PO TABS
10.0000 mg | ORAL_TABLET | Freq: Every day | ORAL | 3 refills | Status: DC
  Filled 2023-06-08: qty 90, 90d supply, fill #0

## 2023-06-08 NOTE — Progress Notes (Signed)
 Cardiology Office Note:  .   Date:  06/08/2023  ID:  [name], DOB 04-Jul-1960, MRN [id number] PCP:  Roslyn Coombe, MD  Former Cardiology Providers: Dr. Ralene Burger  [facility] Providers Cardiologist:  Olinda Bertrand, DO , [facility] (established care 02/15/2020) Electrophysiologist:  None  Click to update primary MD,subspecialty MD or APP then REFRESH:1}    Chief Complaint  Patient presents with   Atherosclerosis of native coronary artery of native heart w   Follow-up    History of Present Illness: .   [name] is a 63 y.o. African-American female whose past medical history and cardiovascular risk factors includes: Severe coronary artery calcification, aortic atherosclerosis, moderate CAD per CCTA, hypertension, non-insulin -dependent diabetes mellitus type 2, hyperlipidemia, RBBB, COPD,active tobacco use.   In the past patient presented to the office for chest pain evaluation.  She eventually underwent left heart catheterization which noted diffuse disease in the RCA with occlusion of the mid segment with left-to-right collateral flow.  She was recommended to continue with medical therapy and up titration of antianginal and GDMT.  However, if she is refractory to medical therapy high risk PCI could be considered.  Patient has remained asymptomatic with uptitration of GDMT as well as antianginal therapy since February 2022.  She presents today for 1 year follow-up visit.  Accompanied by her caregiver Myrtie Atkinson.  Denies anginal chest pain or heart failure symptoms.  No use of sublingual nitroglycerin  tablets since the last office visit.  Recently has been running low on her cardiovascular medications and is requesting refills.  At times experiences lightheaded and dizziness.  She is lost 23 pounds since last 1 year which could be contributing.  She continues to smoke 0.5 packs/day.  Currently has URI symptoms but denies fevers and chills.  Review of Systems: .   Review  of Systems  HENT:  Positive for congestion.   Cardiovascular:  Negative for chest pain, claudication, irregular heartbeat, leg swelling, near-syncope, orthopnea, palpitations, paroxysmal nocturnal dyspnea and syncope.  Respiratory:  Positive for cough. Negative for shortness of breath.   Hematologic/Lymphatic: Negative for bleeding problem.    Studies Reviewed:   EKG: EKG Interpretation Date/Time:  Tuesday June 08 2023 08:49:13 EDT Ventricular Rate:  74 PR Interval:  152 QRS Duration:  118 QT Interval:  418 QTC Calculation: 463 R Axis:   80  Text Interpretation: Normal sinus rhythm Right bundle branch block When compared with ECG of 31-Jan-2021 11:48, No significant change since last tracing Confirmed by Olinda Bertrand [phone]) on 06/08/2023 9:03:05 AM  Echocardiogram: 08/27/2017: Left ventricle: The cavity size was normal. Systolic function was   normal. The estimated ejection fraction was in the range of 60%   to 65%. Wall motion was normal; there were no regional wall  motion abnormalities.    03/09/2022:  Low-normal LV systolic function with visual EF 50-55%. Left ventricle cavity is normal in size. Normal global wall motion. Doppler evidence of grade I (impaired) diastolic dysfunction, normal LAP. Calculated EF 51%. Structurally normal tricuspid valve with trace regurgitation. No evidence of pulmonary hypertension.  CTA 03/13/2020: IMPRESSION:  1. Coronary calcium  score of 1308. This was 99th percentile for age and sex matched control. 2. Normal coronary origin with right dominance. 3. CAD-RADS = 4. Moderate stenosis (50-69%) in the proximal and mid LAD. Moderate stenosis (50-69%) within ostial segment of D1 and D2 branches. Moderate stenosis (50-69%) in the proximal LCx. Moderate stenosis (50-69%) in the ostial RCA and severe stenosis (70-99%) within distal RCA.  4. Aortic atherosclerosis.   CT FFR 03/13/2020: 1. CT FFR analysis showed no significant stenosis within the LAD or  LCX distribution. 2. CT FFR analysis showed mid to distal RCA modeled as total occlusion.   Heart Catheterization: Left Heart Catheterization 04/09/20:  LV: Normal LV systolic function, EF 55%, no significant mitral regurgitation.  Normal EDP.  No pressure gradient across the aortic valve. RCA: Is a moderate caliber vessel, it is a dominant vessel, it is diffusely diseased from the midsegment and is 100% occluded.  Faint micro channels are evident to the distal RCA.  There are collaterals evident from the LAD to the RCA. Left main: Calcified.  No significant disease. LAD: Moderate amount of diffuse calcification.  There is no high-grade stenosis.  Gives origin to a large diagonal 1.  Mild to moderate diffuse disease is evident.  LAD gives collaterals to the dominant RCA. RI: Large vessel.  Again has mild to moderate amount of diffuse coronary calcification.  No significant high-grade stenosis. CX: Small to moderate-sized vessel.  Mild diffuse calcific disease evident.   Recommendation: Patient was recommended aggressive medical therapy, smoking cessation.  If she still continues to have angina pectoris, the CTO to the RCA appears amenable for percutaneous coronary revascularization.  We could certainly attempt this.  Distal vessel is diffusely diseased hence at this point I prefer medical therapy but if patient continues to have frequent angina we will proceed with PCI to the right coronary artery.  60 mL contrast utilized.  RADIOLOGY: NA  Risk Assessment/Calculations:   NA   Labs:       Latest Ref Rng & Units 02/12/2023   11:08 AM 07/15/2022   11:48 AM 09/24/2021    2:57 PM  CBC  WBC 4.0 - 10.5 K/uL 7.2  6.0  5.6   Hemoglobin 12.0 - 15.0 g/dL 16.1  09.6  04.5   Hematocrit 36.0 - 46.0 % 41.8  39.3  39.5   Platelets 150.0 - 400.0 K/uL 273.0  226.0  247        Latest Ref Rng & Units 02/12/2023   11:08 AM 07/15/2022   11:48 AM 01/14/2022   11:48 AM  BMP  Glucose 70 - 99 mg/dL 409   94  96   BUN 6 - 23 mg/dL 19  17  16    Creatinine 0.40 - 1.20 mg/dL 8.11  9.14  7.82   Sodium 135 - 145 mEq/L 137  140  140   Potassium 3.5 - 5.1 mEq/L 3.2  3.3  3.7   Chloride 96 - 112 mEq/L 102  104  103   CO2 19 - 32 mEq/L 25  28  30    Calcium  8.4 - 10.5 mg/dL 9.3  9.6  9.8       Latest Ref Rng & Units 02/12/2023   11:08 AM 07/15/2022   11:48 AM 01/14/2022   11:48 AM  CMP  Glucose 70 - 99 mg/dL 956  94  96   BUN 6 - 23 mg/dL 19  17  16    Creatinine 0.40 - 1.20 mg/dL 2.13  0.86  5.78   Sodium 135 - 145 mEq/L 137  140  140   Potassium 3.5 - 5.1 mEq/L 3.2  3.3  3.7   Chloride 96 - 112 mEq/L 102  104  103   CO2 19 - 32 mEq/L 25  28  30    Calcium  8.4 - 10.5 mg/dL 9.3  9.6  9.8   Total Protein 6.0 - 8.3  g/dL 7.2  7.4  7.8   Total Bilirubin 0.2 - 1.2 mg/dL 0.8  0.4  0.3   Alkaline Phos 39 - 117 U/L 85  86  119   AST 0 - 37 U/L 21  19  23    ALT 0 - 35 U/L 13  15  18      Lab Results  Component Value Date   CHOL 229 (H) 02/12/2023   HDL 87.90 02/12/2023   LDLCALC 100 (H) 02/12/2023   LDLDIRECT 110 (H) 04/03/2020   TRIG 205.0 (H) 02/12/2023   CHOLHDL 3 02/12/2023   No results for input(s): "LIPOA" in the last 8760 hours. No components found for: "NTPROBNP" No results for input(s): "PROBNP" in the last 8760 hours. Recent Labs    07/15/22 1148 02/12/23 1108  TSH 0.80 1.54    Physical Exam:    Today's Vitals   06/08/23 0848  BP: 112/80  Pulse: 73  Resp: 16  SpO2: 97%  Weight: 111 lb 12.8 oz (50.7 kg)  Height: 5\' 5"  (1.651 m)   Body mass index is 18.6 kg/m. Wt Readings from Last 3 Encounters:  06/08/23 111 lb 12.8 oz (50.7 kg)  05/24/23 117 lb (53.1 kg)  02/12/23 117 lb (53.1 kg)    Physical Exam  Constitutional: No distress. She appears chronically ill.  hemodynamically stable  Neck: No JVD present.  Cardiovascular: Normal rate, regular rhythm, S1 normal and S2 normal. Exam reveals no gallop, no S3 and no S4.  No murmur heard. Pulmonary/Chest: Effort  normal. No stridor. She has no rales.  Musculoskeletal:        General: No edema.     Cervical back: Neck supple.  Skin: Skin is warm.     Impression & Recommendation(s):  Impression:   ICD-10-CM   1. Atherosclerosis of native coronary artery of native heart without angina pectoris  I25.10 EKG 12-Lead    clopidogrel  (PLAVIX ) 75 MG tablet    isosorbide -hydrALAZINE  (BIDIL ) 20-37.5 MG tablet    2. Coronary atherosclerosis due to calcified coronary lesion  I25.10 clopidogrel  (PLAVIX ) 75 MG tablet   I25.84 ECHOCARDIOGRAM COMPLETE    3. Essential hypertension  I10 empagliflozin  (JARDIANCE ) 10 MG TABS tablet    losartan  (COZAAR ) 50 MG tablet    4. Type 2 diabetes mellitus without complication, without long-term current use of insulin  (HCC)  E11.9 empagliflozin  (JARDIANCE ) 10 MG TABS tablet    5. Mixed hyperlipidemia  E78.2 AMB Referral to [facility] Pharm-D    6. Cigarette smoker  F17.210        Recommendation(s):  Atherosclerosis of native coronary artery of native heart without angina pectoris Coronary atherosclerosis due to calcified coronary lesion Denies anginal chest pain. No use of sublingual nitroglycerin  tablets since the last office visit. EKG is nonischemic No history of coronary artery disease currently being treated medically.  If refractory to medical therapy high risk PCI can be considered as per the last angiography report.  She is in manage medically since 2022 and has done well overall. Reemphasized importance of complete smoking cessation. Antianginal therapy: Toprol -XL, isosorbide , amlodipine , sublingual nitroglycerin  tablets Will refill Plavix  75 mg p.o. daily. Will refill Jardiance  10 mg p.o. daily. Refilled BiDil  20/37.5 p.o. twice daily, was taking it 3 times daily.  Decreasing the dose due to symptoms of lightheaded and dizziness Refill and reduce the dose of losartan  to 50 mg p.o. daily Echo in 6 months to reevaluate LVEF in the setting of CAD Lipids are  still not optimized despite being  on high intensity statin therapy. Will refer her to Pharm.D. clinic for PCSK9 initiation Recommend a goal LDL <55 mg/dL  Essential hypertension Office blood pressures are very well-controlled. Reconcile and change in doses as mentioned above  Type 2 diabetes mellitus without complication, without long-term current use of insulin  (HCC) Reemphasized importance of glycemic control Currently on ARB, statin therapy, will initiate PCSK9 inhibitors  Mixed hyperlipidemia Lipids are still not well-controlled despite being on maximally tolerated dose of statin therapy. Will refer her to Pharm.D. for PCSK9 inhibitor initiation in the setting of multivessel CAD  Cigarette smoker Tobacco cessation counseling: Currently smoking 0.5ppd She is informed of the dangers of tobacco abuse including stroke, cancer, and MI, as well as benefits of tobacco cessation. She is willing to quit at this time. 5 mins were spent counseling patient cessation techniques. We discussed various methods to help quit smoking, including deciding on a date to quit, joining a support group, pharmacological agents- nicotine  gum/patch/lozenges.  I will reassess her progress at the next follow-up visit  Orders Placed:  Orders Placed This Encounter  Procedures   AMB Referral to [facility] Pharm-D    Referral Priority:   Routine    Referral Type:   Consultation    Referral Reason:   Specialty Services Required    Number of Visits Requested:   1   EKG 12-Lead   ECHOCARDIOGRAM COMPLETE    Standing Status:   Future    Expected Date:   11/17/2023    Expiration Date:   06/07/2024    Where should this test be performed:   [facility])    Does the patient weigh less than or greater than 250 lbs?:   Patient weighs less than 250 lbs    Perflutren DEFINITY (image enhancing agent) should be administered unless hypersensitivity or allergy exist:   Administer Perflutren    Reason for  exam-Echo:   Other-Full Diagnosis List    Full ICD-10/Reason for Exam:   CAD (coronary artery disease) [161096]     Final Medication List:    Meds ordered this encounter  Medications   clopidogrel  (PLAVIX ) 75 MG tablet    Sig: Take 1 tablet (75 mg total) by mouth daily.    Dispense:  90 tablet    Refill:  3   empagliflozin  (JARDIANCE ) 10 MG TABS tablet    Sig: Take 1 tablet (10 mg total) by mouth daily before breakfast.    Dispense:  90 tablet    Refill:  3   isosorbide -hydrALAZINE  (BIDIL ) 20-37.5 MG tablet    Sig: Take 1 tablet by mouth in the morning and at bedtime.    Dispense:  180 tablet    Refill:  3   losartan  (COZAAR ) 50 MG tablet    Sig: Take 1 tablet (50 mg total) by mouth daily.    Dispense:  90 tablet    Refill:  3    Decreasing to 50 mg    Medications Discontinued During This Encounter  Medication Reason   isosorbide -hydrALAZINE  (BIDIL ) 20-37.5 MG tablet Reorder   clopidogrel  (PLAVIX ) 75 MG tablet Reorder   losartan  (COZAAR ) 100 MG tablet Reorder   empagliflozin  (JARDIANCE ) 10 MG TABS tablet Reorder     Current Outpatient Medications:    albuterol  (VENTOLIN  HFA) 108 (90 Base) MCG/ACT inhaler, Inhale 2 puffs into the lungs every 6 (six) hours as needed for wheezing or shortness of breath., Disp: 8 g, Rfl: 5   amLODipine  (NORVASC ) 10 MG tablet, Take 1  tablet (10 mg total) by mouth daily., Disp: 90 tablet, Rfl: 2   aspirin  EC 81 MG tablet, Take 1 tablet (81 mg total) by mouth daily. Swallow whole., Disp: 90 tablet, Rfl: 3   atorvastatin  (LIPITOR) 80 MG tablet, Take 1 tablet (80 mg total) by mouth daily., Disp: 90 tablet, Rfl: 2   baclofen  (LIORESAL ) 10 MG tablet, Take 1 tablet (10 mg total) by mouth 3 (three) times daily as needed., Disp: 90 tablet, Rfl: 1   buPROPion  (WELLBUTRIN ) 75 MG tablet, Take 1 tablet (75 mg total) by mouth 2 (two) times daily., Disp: 180 tablet, Rfl: 2   Cholecalciferol  50 MCG (2000 UT) TABS, 1 tab by mouth once daily, Disp: 30 tablet, Rfl:  99   diazepam  (VALIUM ) 5 MG tablet, TAKE 1 TABLET BY MOUTH TWICE DAILY AS NEEDED FOR ANXIETY AND  SEDATION, Disp: 60 tablet, Rfl: 2   DOTTI  0.05 MG/24HR patch, APPLY 1 PATCH TOPICALLY TWICE A WEEK, Disp: 24 patch, Rfl: 0   esomeprazole  (NEXIUM ) 40 MG capsule, Take 1 capsule (40 mg total) by mouth daily., Disp: 90 capsule, Rfl: 2   Estradiol  (IMVEXXY  MAINTENANCE PACK) 10 MCG INST, Place 1 tablet vaginally 2 (two) times a week., Disp: 8 each, Rfl: 11   fluticasone  (FLONASE ) 50 MCG/ACT nasal spray, Place 2 sprays into both nostrils daily., Disp: 16 g, Rfl: 11   gabapentin  (NEURONTIN ) 300 MG capsule, Take 1 capsule (300 mg total) by mouth 3 (three) times daily., Disp: 270 capsule, Rfl: 0   ketoconazole  (NIZORAL ) 2 % cream, APPLY  CREAM TOPICALLY ONCE DAILY, Disp: 30 g, Rfl: 0   metoprolol  succinate (TOPROL -XL) 100 MG 24 hr tablet, Take 1 tablet (100 mg total) by mouth daily. Take with or immediately following a meal., Disp: 90 tablet, Rfl: 1   nitroGLYCERIN  (NITROSTAT ) 0.4 MG SL tablet, Place 1 tablet (0.4 mg total) under the tongue every 5 (five) minutes as needed for chest pain., Disp: 25 tablet, Rfl: 1   NUCYNTA 50 MG tablet, Take 50 mg by mouth 3 (three) times daily as needed for moderate pain or severe pain., Disp: , Rfl:    ondansetron  (ZOFRAN ) 4 MG tablet, Take 1 tablet (4 mg total) by mouth every 8 (eight) hours as needed for nausea or vomiting., Disp: 30 tablet, Rfl: 1   progesterone  (PROMETRIUM ) 100 MG capsule, Take 1 capsule by mouth once daily, Disp: 90 capsule, Rfl: 0   sertraline  (ZOLOFT ) 100 MG tablet, Take 1 tablet (100 mg total) by mouth daily., Disp: 90 tablet, Rfl: 1   clopidogrel  (PLAVIX ) 75 MG tablet, Take 1 tablet (75 mg total) by mouth daily., Disp: 90 tablet, Rfl: 3   empagliflozin  (JARDIANCE ) 10 MG TABS tablet, Take 1 tablet (10 mg total) by mouth daily before breakfast., Disp: 90 tablet, Rfl: 3   isosorbide -hydrALAZINE  (BIDIL ) 20-37.5 MG tablet, Take 1 tablet by mouth in the  morning and at bedtime., Disp: 180 tablet, Rfl: 3   losartan  (COZAAR ) 50 MG tablet, Take 1 tablet (50 mg total) by mouth daily., Disp: 90 tablet, Rfl: 3  Consent:   NA  Disposition:   6 month follow up   Her questions and concerns were addressed to her satisfaction. She voices understanding of the recommendations provided during this encounter.    Signed, Olinda Bertrand, DO, [facility]  [facility]  [address] [place], [place] [address] 06/08/2023 11:04 AM

## 2023-06-08 NOTE — Patient Instructions (Addendum)
 Medication Instructions:  Your physician has recommended you make the following change in your medication:   DECREASE Losartan  to 50 mg once daily   CHANGE Isosorbide -Hydralazine  (Bidil ) to twice daily  Refills for Jardiance  and Plavix  have been sent to your pharmacy.  *If you need a refill on your cardiac medications before your next appointment, please call your pharmacy*  Lab Work: None ordered today. If you have labs (blood work) drawn today and your tests are completely normal, you will receive your results only by: MyChart Message (if you have MyChart) OR A paper copy in the mail If you have any lab test that is abnormal or we need to change your treatment, we will call you to review the results.  Testing/Procedures: Your physician has requested that you have an echocardiogram in 6 months prior to your follow-up visit with Dr. Albert Huff. Echocardiography is a painless test that uses sound waves to create images of your heart. It provides your doctor with information about the size and shape of your heart and how well your heart's chambers and valves are working. This procedure takes approximately one hour. There are no restrictions for this procedure. Please do NOT wear cologne, perfume, aftershave, or lotions (deodorant is allowed). Please arrive 15 minutes prior to your appointment time.  Please note: We ask at that you not bring children with you during ultrasound (echo/ vascular) testing. Due to room size and safety concerns, children are not allowed in the ultrasound rooms during exams. Our front office staff cannot provide observation of children in our lobby area while testing is being conducted. An adult accompanying a patient to their appointment will only be allowed in the ultrasound room at the discretion of the ultrasound technician under special circumstances. We apologize for any inconvenience.   Follow-Up: At [facility], you and your health needs are our priority.  As  part of our continuing mission to provide you with exceptional heart care, we have created designated Provider Care Teams.  These Care Teams include your primary Cardiologist (physician) and Advanced Practice Providers (APPs -  Physician Assistants and Nurse Practitioners) who all work together to provide you with the care you need, when you need it.  Your next appointment:   6 months (October 2025)  The format for your next appointment:   In Person  Provider:   Olinda Bertrand, [facility]  Other Instructions You have been referred to PharmD to meet with one of our pharmacists to discuss initiation Repatha.    1st Floor: - Lobby - Registration  - Pharmacy  - Lab - Cafe  2nd Floor: - PV Lab - Diagnostic Testing (echo, CT, nuclear med)  3rd Floor: - Vacant  4th Floor: - TCTS (cardiothoracic surgery) - AFib Clinic - Structural Heart Clinic - Vascular Surgery  - Vascular Ultrasound  5th Floor: - HeartCare Cardiology (general and EP) - Clinical Pharmacy for coumadin, hypertension, lipid, weight-loss medications, and med management appointments    Valet parking services will be available as well.

## 2023-06-14 ENCOUNTER — Encounter: Payer: Self-pay | Admitting: Internal Medicine

## 2023-06-14 ENCOUNTER — Ambulatory Visit (INDEPENDENT_AMBULATORY_CARE_PROVIDER_SITE_OTHER)

## 2023-06-14 ENCOUNTER — Ambulatory Visit (INDEPENDENT_AMBULATORY_CARE_PROVIDER_SITE_OTHER): Payer: 59 | Admitting: Internal Medicine

## 2023-06-14 ENCOUNTER — Other Ambulatory Visit (HOSPITAL_COMMUNITY): Payer: Self-pay

## 2023-06-14 ENCOUNTER — Other Ambulatory Visit: Payer: Self-pay

## 2023-06-14 VITALS — BP 120/76 | HR 59 | Temp 97.7°F | Ht 65.0 in | Wt 112.0 lb

## 2023-06-14 DIAGNOSIS — E559 Vitamin D deficiency, unspecified: Secondary | ICD-10-CM | POA: Diagnosis not present

## 2023-06-14 DIAGNOSIS — Z0001 Encounter for general adult medical examination with abnormal findings: Secondary | ICD-10-CM

## 2023-06-14 DIAGNOSIS — E119 Type 2 diabetes mellitus without complications: Secondary | ICD-10-CM

## 2023-06-14 DIAGNOSIS — F17200 Nicotine dependence, unspecified, uncomplicated: Secondary | ICD-10-CM | POA: Diagnosis not present

## 2023-06-14 DIAGNOSIS — E538 Deficiency of other specified B group vitamins: Secondary | ICD-10-CM

## 2023-06-14 DIAGNOSIS — Z202 Contact with and (suspected) exposure to infections with a predominantly sexual mode of transmission: Secondary | ICD-10-CM | POA: Insufficient documentation

## 2023-06-14 DIAGNOSIS — R490 Dysphonia: Secondary | ICD-10-CM | POA: Insufficient documentation

## 2023-06-14 DIAGNOSIS — E1165 Type 2 diabetes mellitus with hyperglycemia: Secondary | ICD-10-CM

## 2023-06-14 DIAGNOSIS — E782 Mixed hyperlipidemia: Secondary | ICD-10-CM | POA: Diagnosis not present

## 2023-06-14 DIAGNOSIS — Z7984 Long term (current) use of oral hypoglycemic drugs: Secondary | ICD-10-CM

## 2023-06-14 DIAGNOSIS — T24211D Burn of second degree of right thigh, subsequent encounter: Secondary | ICD-10-CM | POA: Diagnosis not present

## 2023-06-14 DIAGNOSIS — I10 Essential (primary) hypertension: Secondary | ICD-10-CM

## 2023-06-14 LAB — URINALYSIS, ROUTINE W REFLEX MICROSCOPIC
Bilirubin Urine: NEGATIVE
Hgb urine dipstick: NEGATIVE
Ketones, ur: NEGATIVE
Nitrite: POSITIVE — AB
RBC / HPF: NONE SEEN (ref 0–?)
Specific Gravity, Urine: 1.015 (ref 1.000–1.030)
Total Protein, Urine: NEGATIVE
Urine Glucose: NEGATIVE
Urobilinogen, UA: 0.2 (ref 0.0–1.0)
pH: 6 (ref 5.0–8.0)

## 2023-06-14 LAB — CBC WITH DIFFERENTIAL/PLATELET
Basophils Absolute: 0 10*3/uL (ref 0.0–0.1)
Basophils Relative: 1 % (ref 0.0–3.0)
Eosinophils Absolute: 0.2 10*3/uL (ref 0.0–0.7)
Eosinophils Relative: 4.6 % (ref 0.0–5.0)
HCT: 41.5 % (ref 36.0–46.0)
Hemoglobin: 13.8 g/dL (ref 12.0–15.0)
Lymphocytes Relative: 46.3 % — ABNORMAL HIGH (ref 12.0–46.0)
Lymphs Abs: 2.2 10*3/uL (ref 0.7–4.0)
MCHC: 33.3 g/dL (ref 30.0–36.0)
MCV: 98.9 fl (ref 78.0–100.0)
Monocytes Absolute: 0.4 10*3/uL (ref 0.1–1.0)
Monocytes Relative: 7.9 % (ref 3.0–12.0)
Neutro Abs: 1.9 10*3/uL (ref 1.4–7.7)
Neutrophils Relative %: 40.2 % — ABNORMAL LOW (ref 43.0–77.0)
Platelets: 217 10*3/uL (ref 150.0–400.0)
RBC: 4.2 Mil/uL (ref 3.87–5.11)
RDW: 13.9 % (ref 11.5–15.5)
WBC: 4.8 10*3/uL (ref 4.0–10.5)

## 2023-06-14 LAB — HEPATIC FUNCTION PANEL
ALT: 15 U/L (ref 0–35)
AST: 25 U/L (ref 0–37)
Albumin: 4.5 g/dL (ref 3.5–5.2)
Alkaline Phosphatase: 103 U/L (ref 39–117)
Bilirubin, Direct: 0.1 mg/dL (ref 0.0–0.3)
Total Bilirubin: 0.3 mg/dL (ref 0.2–1.2)
Total Protein: 7.7 g/dL (ref 6.0–8.3)

## 2023-06-14 LAB — HEMOGLOBIN A1C: Hgb A1c MFr Bld: 5.9 % (ref 4.6–6.5)

## 2023-06-14 LAB — LIPID PANEL
Cholesterol: 211 mg/dL — ABNORMAL HIGH (ref 0–200)
HDL: 99 mg/dL (ref >39.00)
LDL Cholesterol: 97 mg/dL (ref 0–99)
NonHDL: 111.55
Total CHOL/HDL Ratio: 2
Triglycerides: 72 mg/dL (ref 0.0–149.0)
VLDL: 14.4 mg/dL (ref 0.0–40.0)

## 2023-06-14 LAB — BASIC METABOLIC PANEL WITH GFR
BUN: 11 mg/dL (ref 6–23)
CO2: 31 meq/L (ref 19–32)
Calcium: 9.5 mg/dL (ref 8.4–10.5)
Chloride: 104 meq/L (ref 96–112)
Creatinine, Ser: 0.99 mg/dL (ref 0.40–1.20)
GFR: 62.04 mL/min (ref >60.00)
Glucose, Bld: 92 mg/dL (ref 70–99)
Potassium: 3.5 meq/L (ref 3.5–5.1)
Sodium: 143 meq/L (ref 135–145)

## 2023-06-14 LAB — TSH: TSH: 1.77 u[IU]/mL (ref 0.35–5.50)

## 2023-06-14 LAB — VITAMIN D 25 HYDROXY (VIT D DEFICIENCY, FRACTURES): VITD: 32.04 ng/mL (ref 30.00–100.00)

## 2023-06-14 LAB — VITAMIN B12: Vitamin B-12: 505 pg/mL (ref 211–911)

## 2023-06-14 LAB — MICROALBUMIN / CREATININE URINE RATIO
Creatinine,U: 99.3 mg/dL
Microalb Creat Ratio: 10.1 mg/g (ref 0.0–30.0)
Microalb, Ur: 1 mg/dL (ref 0.0–1.9)

## 2023-06-14 MED ORDER — AZITHROMYCIN 250 MG PO TABS
ORAL_TABLET | ORAL | 1 refills | Status: AC
  Filled 2023-06-14 (×2): qty 6, 5d supply, fill #0
  Filled 2023-07-01: qty 6, 5d supply, fill #1

## 2023-06-14 MED ORDER — SILVER SULFADIAZINE 1 % EX CREA
1.0000 | TOPICAL_CREAM | Freq: Every day | CUTANEOUS | 1 refills | Status: DC
  Filled 2023-06-14: qty 50, 50d supply, fill #0
  Filled 2023-06-14: qty 50, 30d supply, fill #0
  Filled 2023-08-02: qty 50, 50d supply, fill #1

## 2023-06-14 MED ORDER — EMPAGLIFLOZIN 10 MG PO TABS
10.0000 mg | ORAL_TABLET | Freq: Every day | ORAL | 3 refills | Status: AC
  Filled 2023-06-14 – 2023-08-10 (×4): qty 90, 90d supply, fill #0
  Filled 2023-11-08: qty 90, 90d supply, fill #1
  Filled 2024-02-06: qty 90, 90d supply, fill #2
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
## 2023-06-14 NOTE — Assessment & Plan Note (Signed)
 Also for silvadene cream

## 2023-06-14 NOTE — Patient Instructions (Addendum)
 Please have your Shingrix (shingles) shots done at your local pharmacy.  Please take all new medication as prescribed - the burn cream, and the antibiotic  Please continue all other medications as before, and refills have been done if requested - the jardiance   Please have the pharmacy call with any other refills you may need.  Please continue your efforts at being more active, low cholesterol diet, and weight control.  You are otherwise up to date with prevention measures today.  Please keep your appointments with your specialists as you may have planned  Please go to the XRAY Department in the first floor for the x-ray testing  Please go to the LAB at the blood drawing area for the tests to be done  You will be contacted by phone if any changes need to be made immediately.  Otherwise, you will receive a letter about your results with an explanation, but please check with MyChart first.  Please make an Appointment to return in 6 months, or sooner if needed

## 2023-06-14 NOTE — Progress Notes (Signed)
 The test results show that your current treatment is OK, as the tests are stable.  Please continue the same plan.  There is no other need for change of treatment or further evaluation based on these results, at this time.  thanks

## 2023-06-14 NOTE — Assessment & Plan Note (Signed)
 Lab Results  Component Value Date   LDLCALC 97 06/14/2023   uncontrolled, pt to continue current statin lipitor 80 mg every day, delcines other zetia for now

## 2023-06-14 NOTE — Assessment & Plan Note (Signed)
 Last vitamin D  Lab Results  Component Value Date   VD25OH 32.04 06/14/2023   Low, to start oral replacement

## 2023-06-14 NOTE — Assessment & Plan Note (Signed)
 Age and sex appropriate education and counseling updated with regular exercise and diet Referrals for preventative services - none needed Immunizations addressed - for shingrix at pharmacy Smoking counseling  - pt counsled to quit, pt not ready Evidence for depression or other mood disorder - none significant Most recent labs reviewed. I have personally reviewed and have noted: 1) the patient's medical and social history 2) The patient's current medications and supplements 3) The patient's height, weight, and BMI have been recorded in the chart

## 2023-06-14 NOTE — Assessment & Plan Note (Signed)
 Etiology unclear, despite allergy med tx, for zpack, cxr

## 2023-06-14 NOTE — Assessment & Plan Note (Signed)
 Lab Results  Component Value Date   HGBA1C 5.9 06/14/2023   Stable, pt to continue current medical treatment jardiance  10 mg qd

## 2023-06-14 NOTE — Assessment & Plan Note (Signed)
 Pt counsled to quit, pt not ready

## 2023-06-14 NOTE — Assessment & Plan Note (Signed)
 BP Readings from Last 3 Encounters:  06/14/23 120/76  06/08/23 112/80  03/03/23 122/74   Stable low normal, pt to continue medical treatment losartan  50 mg qd

## 2023-06-15 LAB — HIV ANTIBODY (ROUTINE TESTING W REFLEX): HIV 1&2 Ab, 4th Generation: NONREACTIVE

## 2023-06-15 LAB — RPR: RPR Ser Ql: NONREACTIVE

## 2023-06-15 LAB — HSV 2 ANTIBODY, IGG: HSV 2 Glycoprotein G Ab, IgG: 6.01 {index} — ABNORMAL HIGH

## 2023-06-16 ENCOUNTER — Encounter: Payer: Self-pay | Admitting: Internal Medicine

## 2023-06-16 DIAGNOSIS — A6000 Herpesviral infection of urogenital system, unspecified: Secondary | ICD-10-CM | POA: Insufficient documentation

## 2023-06-16 LAB — GC/CHLAMYDIA PROBE AMP
Chlamydia trachomatis, NAA: NEGATIVE
Neisseria Gonorrhoeae by PCR: NEGATIVE

## 2023-06-17 ENCOUNTER — Encounter: Payer: Self-pay | Admitting: Internal Medicine

## 2023-06-21 NOTE — Telephone Encounter (Signed)
 Copied from CRM [id number]. Topic: Clinical - Lab/Test Results >> Jun 21, 2023  3:28 PM [name] wrote: Reason for CRM: Patient is calling regarding lab results, patient having trouble with phone agent barley understood. Called patient back and still having trouble hearing her.  [name] [phone]

## 2023-06-29 ENCOUNTER — Other Ambulatory Visit: Payer: Self-pay

## 2023-06-29 ENCOUNTER — Other Ambulatory Visit (HOSPITAL_COMMUNITY): Payer: Self-pay

## 2023-06-29 MED ORDER — NUCYNTA 50 MG PO TABS
50.0000 mg | ORAL_TABLET | Freq: Three times a day (TID) | ORAL | 0 refills | Status: DC | PRN
  Filled 2023-06-29: qty 90, 30d supply, fill #0

## 2023-06-29 MED ORDER — BACLOFEN 10 MG PO TABS
10.0000 mg | ORAL_TABLET | Freq: Three times a day (TID) | ORAL | 2 refills | Status: DC | PRN
  Filled 2023-06-29: qty 90, 30d supply, fill #0
  Filled 2023-09-09: qty 90, 30d supply, fill #1
  Filled 2023-12-07: qty 90, 30d supply, fill #2

## 2023-06-30 ENCOUNTER — Other Ambulatory Visit (HOSPITAL_COMMUNITY): Payer: Self-pay

## 2023-07-01 ENCOUNTER — Other Ambulatory Visit: Payer: Self-pay

## 2023-07-02 ENCOUNTER — Other Ambulatory Visit: Payer: Self-pay | Admitting: Internal Medicine

## 2023-07-02 NOTE — Telephone Encounter (Unsigned)
 Copied from CRM [id number]. Topic: Clinical - Medication Refill >> Jul 02, 2023  2:35 PM Jenice Mitts wrote: Medication:  diazepam  (VALIUM ) 5 MG tablet    Has the patient contacted their pharmacy? Yes (Agent: If no, request that the patient contact the pharmacy for the refill. If patient does not wish to contact the pharmacy document the reason why and proceed with request.) (Agent: If yes, when and what did the pharmacy advise?)  This is the patient's preferred pharmacy:  [place] [facility] [address], [address] [place] [address] Phone: [phone] Fax: [phone]  Is this the correct pharmacy for this prescription? Yes If no, delete pharmacy and type the correct one.   Has the prescription been filled recently? No  Is the patient out of the medication? Yes  Has the patient been seen for an appointment in the last year OR does the patient have an upcoming appointment? Yes  Can we respond through MyChart? Yes  Agent: Please be advised that Rx refills may take up to 3 business days. We ask that you follow-up with your pharmacy.

## 2023-07-04 ENCOUNTER — Other Ambulatory Visit (HOSPITAL_COMMUNITY): Payer: Self-pay

## 2023-07-05 ENCOUNTER — Telehealth: Payer: Self-pay

## 2023-07-05 ENCOUNTER — Other Ambulatory Visit (HOSPITAL_COMMUNITY): Payer: Self-pay

## 2023-07-05 ENCOUNTER — Telehealth: Payer: Self-pay | Admitting: Internal Medicine

## 2023-07-05 DIAGNOSIS — M51362 Other intervertebral disc degeneration, lumbar region with discogenic back pain and lower extremity pain: Secondary | ICD-10-CM | POA: Diagnosis not present

## 2023-07-05 DIAGNOSIS — R1310 Dysphagia, unspecified: Secondary | ICD-10-CM | POA: Diagnosis not present

## 2023-07-05 DIAGNOSIS — M5412 Radiculopathy, cervical region: Secondary | ICD-10-CM | POA: Diagnosis not present

## 2023-07-05 DIAGNOSIS — M5030 Other cervical disc degeneration, unspecified cervical region: Secondary | ICD-10-CM | POA: Diagnosis not present

## 2023-07-05 MED ORDER — VALACYCLOVIR HCL 500 MG PO TABS
500.0000 mg | ORAL_TABLET | Freq: Every day | ORAL | 3 refills | Status: AC
  Filled 2023-07-05 (×2): qty 90, 90d supply, fill #0
  Filled 2023-09-27: qty 90, 90d supply, fill #1
  Filled 2023-12-26: qty 90, 90d supply, fill #2

## 2023-07-05 NOTE — Telephone Encounter (Signed)
 Copied from CRM [id number]. Topic: General - Other >> Jul 02, 2023  2:40 PM Jenice Mitts wrote: Reason for CRM: Patient is calling because she wants to know if the anti virals can be sent into the pharmacy for her

## 2023-07-05 NOTE — Telephone Encounter (Signed)
 Ok for daily suprressive dosing for valtrex  for pt with recent finding of genital herpes hx

## 2023-07-05 NOTE — Telephone Encounter (Signed)
 Please see request. Patient checking on status of refill.   Copied from CRM [id number]. Topic: Clinical - Medication Refill >> Jul 02, 2023  2:35 PM Jenice Mitts wrote: Medication:  diazepam  (VALIUM ) 5 MG tablet    Has the patient contacted their pharmacy? Yes (Agent: If no, request that the patient contact the pharmacy for the refill. If patient does not wish to contact the pharmacy document the reason why and proceed with request.) (Agent: If yes, when and what did the pharmacy advise?)  This is the patient's preferred pharmacy:  [place] [facility] [address], [address] [place] [address] Phone: [phone] Fax: [phone]  Is this the correct pharmacy for this prescription? Yes If no, delete pharmacy and type the correct one.   Has the prescription been filled recently? No  Is the patient out of the medication? Yes  Has the patient been seen for an appointment in the last year OR does the patient have an upcoming appointment? Yes  Can we respond through MyChart? Yes  Agent: Please be advised that Rx refills may take up to 3 business days. We ask that you follow-up with your pharmacy. >> Jul 05, 2023  3:13 PM Laquanda P wrote: Pt called to check status of refill- advise it is still pending and it could take up to 3 business days

## 2023-07-06 ENCOUNTER — Other Ambulatory Visit (HOSPITAL_COMMUNITY): Payer: Self-pay

## 2023-07-06 ENCOUNTER — Other Ambulatory Visit: Payer: Self-pay

## 2023-07-06 MED ORDER — DIAZEPAM 5 MG PO TABS
5.0000 mg | ORAL_TABLET | Freq: Two times a day (BID) | ORAL | 2 refills | Status: DC | PRN
  Filled 2023-07-06 – 2023-07-07 (×2): qty 60, 30d supply, fill #0
  Filled 2023-08-10: qty 60, 30d supply, fill #1
  Filled 2023-09-09: qty 60, 30d supply, fill #2

## 2023-07-06 NOTE — Addendum Note (Signed)
 Addended by: Roslyn Coombe on: 07/06/2023 01:54 PM   Modules accepted: Orders

## 2023-07-06 NOTE — Telephone Encounter (Signed)
 Ok done erx

## 2023-07-07 ENCOUNTER — Other Ambulatory Visit: Payer: Self-pay

## 2023-07-07 ENCOUNTER — Other Ambulatory Visit (HOSPITAL_COMMUNITY): Payer: Self-pay

## 2023-07-07 MED ORDER — DICLOFENAC SODIUM 1 % EX GEL
CUTANEOUS | 3 refills | Status: AC
  Filled 2023-07-07: qty 100, 13d supply, fill #0
  Filled 2023-07-08: qty 200, 30d supply, fill #0

## 2023-07-07 MED ORDER — PREDNISONE 10 MG PO TABS
ORAL_TABLET | ORAL | 0 refills | Status: DC
  Filled 2023-07-07 (×2): qty 21, 6d supply, fill #0

## 2023-07-07 NOTE — Telephone Encounter (Signed)
  sounds good, thanks!

## 2023-07-08 ENCOUNTER — Other Ambulatory Visit (HOSPITAL_COMMUNITY): Payer: Self-pay

## 2023-07-08 ENCOUNTER — Other Ambulatory Visit: Payer: Self-pay

## 2023-07-08 ENCOUNTER — Encounter: Payer: Self-pay | Admitting: Pharmacist

## 2023-07-14 ENCOUNTER — Other Ambulatory Visit: Payer: Self-pay

## 2023-07-16 ENCOUNTER — Inpatient Hospital Stay: Admission: RE | Admit: 2023-07-16 | Source: Ambulatory Visit

## 2023-07-20 DIAGNOSIS — M5416 Radiculopathy, lumbar region: Secondary | ICD-10-CM | POA: Diagnosis not present

## 2023-07-22 ENCOUNTER — Encounter: Admitting: Radiology

## 2023-08-02 ENCOUNTER — Other Ambulatory Visit: Payer: Self-pay

## 2023-08-02 ENCOUNTER — Other Ambulatory Visit (HOSPITAL_COMMUNITY): Payer: Self-pay

## 2023-08-10 ENCOUNTER — Telehealth: Payer: Self-pay

## 2023-08-10 ENCOUNTER — Encounter: Payer: Self-pay | Admitting: Pharmacist

## 2023-08-10 ENCOUNTER — Ambulatory Visit: Attending: Cardiovascular Disease | Admitting: Pharmacist

## 2023-08-10 ENCOUNTER — Other Ambulatory Visit (HOSPITAL_COMMUNITY): Payer: Self-pay

## 2023-08-10 ENCOUNTER — Other Ambulatory Visit: Payer: Self-pay

## 2023-08-10 ENCOUNTER — Other Ambulatory Visit: Payer: Self-pay | Admitting: Internal Medicine

## 2023-08-10 DIAGNOSIS — I10 Essential (primary) hypertension: Secondary | ICD-10-CM | POA: Diagnosis not present

## 2023-08-10 DIAGNOSIS — F17200 Nicotine dependence, unspecified, uncomplicated: Secondary | ICD-10-CM | POA: Diagnosis not present

## 2023-08-10 DIAGNOSIS — E782 Mixed hyperlipidemia: Secondary | ICD-10-CM

## 2023-08-10 DIAGNOSIS — E119 Type 2 diabetes mellitus without complications: Secondary | ICD-10-CM

## 2023-08-10 DIAGNOSIS — I25118 Atherosclerotic heart disease of native coronary artery with other forms of angina pectoris: Secondary | ICD-10-CM

## 2023-08-10 MED ORDER — GABAPENTIN 300 MG PO CAPS
300.0000 mg | ORAL_CAPSULE | Freq: Three times a day (TID) | ORAL | 0 refills | Status: DC
Start: 2023-08-10 — End: 2023-09-27
  Filled 2023-08-10 (×2): qty 270, 90d supply, fill #0

## 2023-08-10 MED ORDER — VARENICLINE TARTRATE (STARTER) 0.5 MG X 11 & 1 MG X 42 PO TBPK
ORAL_TABLET | ORAL | 0 refills | Status: AC
  Filled 2023-08-10: qty 53, 28d supply, fill #0

## 2023-08-10 MED ORDER — VARENICLINE TARTRATE 1 MG PO TABS
1.0000 mg | ORAL_TABLET | Freq: Two times a day (BID) | ORAL | 1 refills | Status: AC
  Filled 2023-08-10: qty 90, fill #0
  Filled 2023-08-11: qty 90, 45d supply, fill #0
  Filled 2023-08-11: qty 90, 90d supply, fill #0

## 2023-08-10 NOTE — Assessment & Plan Note (Signed)
 Assessment: LDL-C is above goal on atorvastatin  80mg  daily Ezetimibe would not get patient to goal of <55 Discussed PCKS9i, side effects and injection technique Patient is [employer] dual complete so if PA approved will be covered 100%  Plan: Submit PA for Repatha Continue atorvastatin  80mg  daily Labs in 3 months

## 2023-08-10 NOTE — Assessment & Plan Note (Signed)
 Assessment: Patient smoking about 1/2 pack per day She is interested in quitting Discussed medication therapy She is interested in Chantix Educated on possible side effects, possible changes in mood/suicidal ideation Denies any current suicidal thoughts Educated on the starting doses and to continue medication at least 3 months if not more  Plan: Chantix 0.5mg  daily x 3 days, 0.5mg  BID x 4 days then 1mg  BID Start 2 weeks before quit date or plan for decrease (weaning)
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
## 2023-08-10 NOTE — Patient Instructions (Addendum)
 I will submit a prior authorization for Repatha. I will call you once I hear back. Please call me at [phone] with any questions.   Repatha is a cholesterol medication that improved your body's ability to get rid of bad cholesterol known as LDL. It can lower your LDL up to 60%! It is an injection that is given under the skin every 2 weeks. The medication often requires a prior authorization from your insurance company. We will take care of submitting all the necessary information to your insurance company to get it approved. The most common side effects of Repatha include runny nose, symptoms of the common cold, rarely flu or flu-like symptoms, back/muscle pain in about 3-4% of the patients, and redness, pain, or bruising at the injection site. Tell your healthcare provider if you have any side effect that bothers you or that does not go away.   Make sure to take the Chantix for 2 weeks prior to your quit date Take the Chantix for at least 3 months or up to 6 months

## 2023-08-10 NOTE — Telephone Encounter (Signed)
-----   Message from Eleanor JONETTA Crews sent at 08/10/2023  1:03 PM EDT ----- Please do PA for Repatha. LDL-C 97 on atorvastatin  80. ASCVD

## 2023-08-10 NOTE — Telephone Encounter (Signed)
 Pharmacy Patient Advocate Encounter  Received notification from [employer] that Prior Authorization for REPATHA has been APPROVED from 08/10/23 to 02/09/24. Ran test claim, Copay is $0. This test claim was processed through [facility]- copay amounts may vary at other pharmacies due to pharmacy/plan contracts, or as the patient moves through the different stages of their insurance plan.

## 2023-08-10 NOTE — Telephone Encounter (Signed)
 Pharmacy Patient Advocate Encounter   Received notification from Physician's Office that prior authorization for REPATHA is required/requested.   Insurance verification completed.   The patient is insured through [facility] .   Per test claim: PA required; PA submitted to above mentioned insurance via CoverMyMeds Key/confirmation #/[id number] Status is pending

## 2023-08-11 ENCOUNTER — Other Ambulatory Visit: Payer: Self-pay

## 2023-08-11 ENCOUNTER — Telehealth: Payer: Self-pay | Admitting: Internal Medicine

## 2023-08-11 ENCOUNTER — Other Ambulatory Visit (HOSPITAL_COMMUNITY): Payer: Self-pay

## 2023-08-11 MED ORDER — KETOCONAZOLE 2 % EX CREA
TOPICAL_CREAM | CUTANEOUS | 0 refills | Status: DC
  Filled 2023-08-11: qty 30, 30d supply, fill #0

## 2023-08-11 MED ORDER — REPATHA SURECLICK 140 MG/ML ~~LOC~~ SOAJ
1.0000 mL | SUBCUTANEOUS | 11 refills | Status: AC
  Filled 2023-08-11: qty 2, 28d supply, fill #0
  Filled 2023-09-01: qty 2, 28d supply, fill #1
  Filled 2023-09-29: qty 2, 28d supply, fill #2
  Filled 2023-10-27: qty 2, 28d supply, fill #3
  Filled 2023-11-17: qty 2, 28d supply, fill #4
  Filled 2023-12-15: qty 2, 28d supply, fill #5
  Filled 2024-01-12: qty 2, 28d supply, fill #6
  Filled 2024-02-07: qty 2, 28d supply, fill #7
  Filled 2024-03-06: qty 2, 28d supply, fill #8

## 2023-08-11 NOTE — Addendum Note (Signed)
 Addended by: Moriah Loughry D on: 08/11/2023 10:39 AM   Modules accepted: Orders

## 2023-08-11 NOTE — Telephone Encounter (Signed)
 Pt contacted via [employer]

## 2023-08-11 NOTE — Telephone Encounter (Signed)
 Copied from CRM [id number]. Topic: Clinical - Medication Refill >> Aug 11, 2023  9:50 AM Rea C wrote: Medication: ketoconazole  (NIZORAL ) 2 % cream  Has the patient contacted their pharmacy? Yes- Patient tried to do refill through MyChart and it didn't go through per the pharmacy. They said they don't have a new script for it.   This is the patient's preferred pharmacy:  [facility] [address] [place] [address] Phone: [phone] Fax: [phone]  Is this the correct pharmacy for this prescription? Yes If no, delete pharmacy and type the correct one.   Has the prescription been filled recently? Yes  Is the patient out of the medication? Yes  Has the patient been seen for an appointment in the last year OR does the patient have an upcoming appointment? Yes  Can we respond through MyChart? Yes  Agent: Please be advised that Rx refills may take up to 3 business days. We ask that you follow-up with your pharmacy.

## 2023-09-02 ENCOUNTER — Ambulatory Visit: Admitting: Podiatry

## 2023-09-02 DIAGNOSIS — M7741 Metatarsalgia, right foot: Secondary | ICD-10-CM

## 2023-09-03 ENCOUNTER — Other Ambulatory Visit: Payer: Self-pay | Admitting: Radiology

## 2023-09-03 DIAGNOSIS — N958 Other specified menopausal and perimenopausal disorders: Secondary | ICD-10-CM

## 2023-09-03 NOTE — Telephone Encounter (Signed)
 Med refill request: Imvexxy  10 mcg Last AEX: 07/24/21 Next AEX: 09/09/23 Last MMG (if hormonal med) 07/30/22 BI-RADS 1 negative Refill authorized: Please Advise?

## 2023-09-06 ENCOUNTER — Telehealth: Payer: Self-pay | Admitting: *Deleted

## 2023-09-06 ENCOUNTER — Other Ambulatory Visit (HOSPITAL_COMMUNITY): Payer: Self-pay

## 2023-09-06 DIAGNOSIS — R490 Dysphonia: Secondary | ICD-10-CM | POA: Diagnosis not present

## 2023-09-06 DIAGNOSIS — J381 Polyp of vocal cord and larynx: Secondary | ICD-10-CM | POA: Diagnosis not present

## 2023-09-06 DIAGNOSIS — J383 Other diseases of vocal cords: Secondary | ICD-10-CM | POA: Diagnosis not present

## 2023-09-06 DIAGNOSIS — K219 Gastro-esophageal reflux disease without esophagitis: Secondary | ICD-10-CM | POA: Diagnosis not present

## 2023-09-06 MED ORDER — AZELASTINE HCL 137 MCG/SPRAY NA SOLN
1.0000 | Freq: Two times a day (BID) | NASAL | 11 refills | Status: AC
  Filled 2023-09-06: qty 30, 25d supply, fill #0
  Filled 2023-09-09: qty 30, 30d supply, fill #0
  Filled 2023-10-04: qty 30, 30d supply, fill #1
  Filled 2023-11-02: qty 30, 30d supply, fill #2
  Filled 2023-12-02: qty 30, 30d supply, fill #3
  Filled 2023-12-26: qty 30, 30d supply, fill #4
  Filled 2024-01-25: qty 30, 30d supply, fill #5
  Filled 2024-02-23 – 2024-02-29 (×2): qty 30, 30d supply, fill #6

## 2023-09-06 MED ORDER — METHYLPREDNISOLONE 4 MG PO TBPK
ORAL_TABLET | ORAL | 0 refills | Status: DC
  Filled 2023-09-06 – 2023-09-09 (×2): qty 21, 6d supply, fill #0

## 2023-09-06 NOTE — Telephone Encounter (Signed)
   Pre-operative Risk Assessment    Patient Name: [name]  DOB: 03/30/1960 MRN: [id number]   Date of last office visit: 06/09/22 DR. TOLIA Date of next office visit: NONE   Request for Surgical Clearance    Procedure:  MICROLARYNGOSCOPY WITH Bx   Date of Surgery:  Clearance TBD                                Surgeon:  DR. GERARD SHOPE Surgeon's Group or Practice Name:  [facility]  Phone number:  [phone] Fax number:  [phone]   Type of Clearance Requested:   - Medical  - Pharmacy:  Hold Aspirin  and Clopidogrel  (Plavix )     Type of Anesthesia:  Not Indicated   Additional requests/questions:    [name]   09/06/2023, 3:24 PM

## 2023-09-07 ENCOUNTER — Telehealth: Payer: Self-pay

## 2023-09-07 NOTE — Telephone Encounter (Signed)
 Primary Cardiologist:Sunit Tolia, DO   Preoperative team, please contact this patient and set up a phone call appointment for further preoperative risk assessment. Please obtain consent and complete medication review. Thank you for your help.  Per office protocol, and pending no cardiac symptoms at the time of the visit, she may hold Plavix  for 5 days prior to procedure and should resume as soon as hemodynamically stable postoperatively. Ideally aspirin  should be continued without interruption, however if the bleeding risk is too great, aspirin  may be held for 5-7 days prior to surgery. Please resume aspirin  post operatively when it is felt to be safe from a bleeding standpoint.   I also confirmed the patient resides in the state of [place] . As per [facility] Medical Board telemedicine laws, the patient must reside in the state in which the provider is licensed.   Angela EMERSON Bane, NP-C  09/07/2023, 9:57 AM [address], [address] [place], [place] [address] Office [phone] Fax [phone]

## 2023-09-07 NOTE — Telephone Encounter (Signed)
Patient has been scheduled for televisit.

## 2023-09-07 NOTE — Telephone Encounter (Signed)
 Patient has been scheduled for televisit and consent done     Patient Consent for Virtual Visit         [name] has provided verbal consent on 09/07/2023 for a virtual visit (video or telephone).   CONSENT FOR VIRTUAL VISIT FOR:  [name]  By participating in this virtual visit I agree to the following:  I hereby voluntarily request, consent and authorize [facility] and its employed or contracted physicians, physician assistants, nurse practitioners or other licensed health care professionals (the Practitioner), to provide me with telemedicine health care services (the "Services) as deemed necessary by the treating Practitioner. I acknowledge and consent to receive the Services by the Practitioner via telemedicine. I understand that the telemedicine visit will involve communicating with the Practitioner through live audiovisual communication technology and the disclosure of certain medical information by electronic transmission. I acknowledge that I have been given the opportunity to request an in-person assessment or other available alternative prior to the telemedicine visit and am voluntarily participating in the telemedicine visit.  I understand that I have the right to withhold or withdraw my consent to the use of telemedicine in the course of my care at any time, without affecting my right to future care or treatment, and that the Practitioner or I may terminate the telemedicine visit at any time. I understand that I have the right to inspect all information obtained and/or recorded in the course of the telemedicine visit and may receive copies of available information for a reasonable fee.  I understand that some of the potential risks of receiving the Services via telemedicine include:  Delay or interruption in medical evaluation due to technological equipment failure or disruption; Information transmitted may not be sufficient (e.g. poor resolution of  images) to allow for appropriate medical decision making by the Practitioner; and/or  In rare instances, security protocols could fail, causing a breach of personal health information.  Furthermore, I acknowledge that it is my responsibility to provide information about my medical history, conditions and care that is complete and accurate to the best of my ability. I acknowledge that Practitioner's advice, recommendations, and/or decision may be based on factors not within their control, such as incomplete or inaccurate data provided by me or distortions of diagnostic images or specimens that may result from electronic transmissions. I understand that the practice of medicine is not an exact science and that Practitioner makes no warranties or guarantees regarding treatment outcomes. I acknowledge that a copy of this consent can be made available to me via my patient portal [facility] MyChart), or I can request a printed copy by calling the office of [facility].    I understand that my insurance will be billed for this visit.   I have read or had this consent read to me. I understand the contents of this consent, which adequately explains the benefits and risks of the Services being provided via telemedicine.  I have been provided ample opportunity to ask questions regarding this consent and the Services and have had my questions answered to my satisfaction. I give my informed consent for the services to be provided through the use of telemedicine in my medical care

## 2023-09-09 ENCOUNTER — Other Ambulatory Visit: Payer: Self-pay

## 2023-09-09 ENCOUNTER — Ambulatory Visit (INDEPENDENT_AMBULATORY_CARE_PROVIDER_SITE_OTHER): Admitting: Radiology

## 2023-09-09 ENCOUNTER — Other Ambulatory Visit (HOSPITAL_COMMUNITY): Payer: Self-pay

## 2023-09-09 ENCOUNTER — Encounter: Payer: Self-pay | Admitting: Radiology

## 2023-09-09 VITALS — BP 120/70 | Ht 63.0 in | Wt 112.4 lb

## 2023-09-09 DIAGNOSIS — N958 Other specified menopausal and perimenopausal disorders: Secondary | ICD-10-CM | POA: Diagnosis not present

## 2023-09-09 DIAGNOSIS — B009 Herpesviral infection, unspecified: Secondary | ICD-10-CM

## 2023-09-09 DIAGNOSIS — Z113 Encounter for screening for infections with a predominantly sexual mode of transmission: Secondary | ICD-10-CM | POA: Diagnosis not present

## 2023-09-09 DIAGNOSIS — Z9189 Other specified personal risk factors, not elsewhere classified: Secondary | ICD-10-CM

## 2023-09-09 DIAGNOSIS — Z79890 Hormone replacement therapy (postmenopausal): Secondary | ICD-10-CM

## 2023-09-09 DIAGNOSIS — Z01419 Encounter for gynecological examination (general) (routine) without abnormal findings: Secondary | ICD-10-CM

## 2023-09-09 MED ORDER — PROGESTERONE MICRONIZED 100 MG PO CAPS
100.0000 mg | ORAL_CAPSULE | Freq: Every day | ORAL | 4 refills | Status: AC
  Filled 2023-09-09: qty 90, 90d supply, fill #0
  Filled 2023-12-01: qty 90, 90d supply, fill #1
  Filled 2024-02-29: qty 90, 90d supply, fill #2

## 2023-09-09 MED ORDER — ESTRADIOL 0.05 MG/24HR TD PTTW
1.0000 | MEDICATED_PATCH | TRANSDERMAL | 4 refills | Status: AC
  Filled 2023-09-09: qty 8, 28d supply, fill #0
  Filled 2023-09-30: qty 8, 28d supply, fill #1
  Filled 2023-10-28: qty 8, 28d supply, fill #2
  Filled 2023-11-25: qty 8, 28d supply, fill #3
  Filled 2023-12-16: qty 8, 28d supply, fill #4
  Filled 2024-01-13: qty 8, 28d supply, fill #5
  Filled 2024-02-10: qty 8, 28d supply, fill #6
  Filled 2024-03-03: qty 8, 28d supply, fill #7

## 2023-09-09 MED ORDER — ESTRADIOL 10 MCG VA TABS
1.0000 | ORAL_TABLET | VAGINAL | 11 refills | Status: AC
  Filled 2023-09-09: qty 8, 28d supply, fill #0
  Filled 2023-09-30: qty 8, 28d supply, fill #1
  Filled 2023-10-28: qty 8, 28d supply, fill #2
  Filled 2023-11-25: qty 8, 28d supply, fill #3
  Filled 2023-12-16: qty 8, 28d supply, fill #4
  Filled 2024-01-13: qty 8, 28d supply, fill #5
  Filled 2024-02-10: qty 8, 28d supply, fill #6
  Filled 2024-03-03: qty 8, 28d supply, fill #7

## 2023-09-09 NOTE — Progress Notes (Signed)
 [name] 08-17-1960 [id number]   History: Postmenopausal 63 y.o. presents for breast and pelvic exam.Hx of trich.Doing well on HRT, needs refills. Estrogen cream too messy.   Risk Factors for Medicare Patients >/= 5 sexual partners in a lifetime: Yes First intercourse <41 years of age: Yes H/O STD at any age: Yes Abnormal pap smear, < 3 negative paps within the last 7 years: Yes DES exposure (women born between [phone]): No Patient is on post breast cancer medication like Femara or, if medication like this is not needed, 5 years post breast cancer diagnosis: No  Gynecologic History Postmenopausal Last Pap: 07/24/21. Results were: ASCUS HPV neg Last mammogram: 6/24. Results were: normal Last colonoscopy: 2023   Obstetric History OB History  Gravida Para Term Preterm AB Living  2 2    2   SAB IAB Ectopic Multiple Live Births          # Outcome Date GA Lbr Len/2nd Weight Sex Type Anes PTL Lv  2 Para           1 Para                The following portions of the patient's history were reviewed and updated as appropriate: allergies, current medications, past family history, past medical history, past social history, past surgical history, and problem list.  Review of Systems Pertinent items noted in HPI and remainder of comprehensive ROS otherwise negative.  Past medical history, past surgical history, family history and social history were all reviewed and documented in the EPIC chart.  Exam:  There were no vitals filed for this visit. There is no height or weight on file to calculate BMI.  General appearance:  Normal Thyroid :  Symmetrical, normal in size, without palpable masses or nodularity. Respiratory  Auscultation:  Clear without wheezing or rhonchi Cardiovascular  Auscultation:  Regular rate, without rubs, murmurs or gallops  Edema/varicosities:  Not grossly evident Abdominal  Soft,nontender, without masses, guarding or rebound.  Liver/spleen:  No  organomegaly noted  Hernia:  None appreciated  Skin  Inspection:  Grossly normal Breasts: Examined lying and sitting.   Right: Without masses, retractions, nipple discharge or axillary adenopathy.   Left: Without masses, retractions, nipple discharge or axillary adenopathy. Genitourinary   Inguinal/mons:  Normal without inguinal adenopathy  External genitalia:  Normal appearing vulva with no masses, tenderness, or lesions  BUS/Urethra/Skene's glands:  Normal  Vagina:  Normal appearing with normal color and discharge, no lesions. Atrophy: moderate   Cervix:  Normal appearing without discharge or lesions  Uterus:  Normal in size, shape and contour.  Midline and mobile, nontender  Adnexa/parametria:     Rt: Normal in size, without masses or tenderness.   Lt: Normal in size, without masses or tenderness.  Anus and perineum: Normal    Darice Hoit, CMA present for exam  Assessment/Plan:   1. Encounter for breast and pelvic examination Schedule overdue mammogram Pap 2026  2. Screen for STD (sexually transmitted disease) (Primary) - SURESWAB CT/NG/T. vaginalis  3. Hormone replacement therapy (HRT) - estradiol  (DOTTI ) 0.05 MG/24HR patch; Place 1 patch (0.05 mg total) onto the skin 2 (two) times a week.  Dispense: 24 patch; Refill: 4 - progesterone  (PROMETRIUM ) 100 MG capsule; Take 1 capsule (100 mg total) by mouth daily.  Dispense: 90 capsule; Refill: 4  4. Genitourinary syndrome of menopause - Estradiol  10 MCG TABS vaginal tablet; Place 1 tablet (10 mcg total) vaginally 2 (two) times a week.  Dispense: 8 tablet; Refill:  11   Return in 1 year for  high risk annual or sooner prn.  Violeta Lecount B WHNP-BC, 9:43 AM 09/09/2023

## 2023-09-10 ENCOUNTER — Ambulatory Visit (INDEPENDENT_AMBULATORY_CARE_PROVIDER_SITE_OTHER)

## 2023-09-10 DIAGNOSIS — Z01810 Encounter for preprocedural cardiovascular examination: Secondary | ICD-10-CM

## 2023-09-10 LAB — SURESWAB CT/NG/T. VAGINALIS
C. trachomatis RNA, TMA: NOT DETECTED
N. gonorrhoeae RNA, TMA: NOT DETECTED
Trichomonas vaginalis RNA: NOT DETECTED
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
## 2023-09-10 NOTE — Telephone Encounter (Signed)
 I s/w the pt who missed her tele preop appt today and has rescheduled to 09/16/23.

## 2023-09-13 ENCOUNTER — Ambulatory Visit: Payer: Self-pay | Admitting: Radiology
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
## 2023-09-15 NOTE — Telephone Encounter (Signed)
 2nd clearance request received.

## 2023-09-16 ENCOUNTER — Ambulatory Visit: Attending: Cardiology

## 2023-09-16 ENCOUNTER — Telehealth: Payer: Self-pay | Admitting: *Deleted

## 2023-09-16 DIAGNOSIS — Z01810 Encounter for preprocedural cardiovascular examination: Secondary | ICD-10-CM

## 2023-09-16 NOTE — Telephone Encounter (Signed)
 Per preop APP Josefa Beauvais, FNP to reach out to the pt as she is going to need to schedule an appt in office for preop clearance. See notes from Josefa Beauvais, FNP.   Left message for the pt to call and schedule in office appt for episodes of passing out 2-3 weeks as well as for preop clearance.

## 2023-09-16 NOTE — Telephone Encounter (Signed)
-----   Message from [name] sent at 09/16/2023  2:52 PM EDT ----- Patient reports episodes of passing out 2 to 3 weeks ago.  She is unable to complete acceptable amount of physical activity.  Please schedule an office visit for her.  Thank you

## 2023-09-24 NOTE — Telephone Encounter (Signed)
 Was getting ready to call the patient and schedule her for a pre-op appointment. She has been scheduled for 09/27/2023 with Dr. Michele for clearance. Will remove this from our pre-op pool.

## 2023-09-27 ENCOUNTER — Encounter: Payer: Self-pay | Admitting: Cardiology

## 2023-09-27 ENCOUNTER — Ambulatory Visit: Attending: Cardiology | Admitting: Cardiology

## 2023-09-27 ENCOUNTER — Other Ambulatory Visit: Payer: Self-pay

## 2023-09-27 VITALS — BP 120/78 | HR 61 | Resp 16 | Ht 65.0 in | Wt 110.8 lb

## 2023-09-27 DIAGNOSIS — F17210 Nicotine dependence, cigarettes, uncomplicated: Secondary | ICD-10-CM

## 2023-09-27 DIAGNOSIS — I10 Essential (primary) hypertension: Secondary | ICD-10-CM

## 2023-09-27 DIAGNOSIS — Z01810 Encounter for preprocedural cardiovascular examination: Secondary | ICD-10-CM

## 2023-09-27 DIAGNOSIS — I25118 Atherosclerotic heart disease of native coronary artery with other forms of angina pectoris: Secondary | ICD-10-CM

## 2023-09-27 DIAGNOSIS — E782 Mixed hyperlipidemia: Secondary | ICD-10-CM | POA: Diagnosis not present

## 2023-09-27 NOTE — Progress Notes (Signed)
 SDW call  Patient was given pre-op instructions over the phone. Patient verbalized understanding of instructions provided.     PCP - Dr. Lynwood Rush Cardiologist - Dr. Madonna Large    PPM/ICD - Denies Device Orders - n/a Rep Notified - n/a   Chest x-ray - n/a EKG -  09-27-23 Stress Test -denies ECHO - 08-27-17 Cardiac Cath - 04-09-20  Sleep Study/sleep apnea/CPAP: Denies  Fasting Blood sugar range - unsure. Patient does not check How often check sugars: patient does not check.  Only checks in the doctor's office   Blood Thinner Instructions: Plavix  - last dose per patient was on 09-25-23 Aspirin  Instructions: Last dose per patient was on 09-26-23   ERAS Protcol - NPO PRE-SURGERY Ensure or G2- n/a   COVID TEST- n/a     Anesthesia review: Yes,    Patient denies shortness of breath, fever, cough and chest pain over the phone call. Patient denies any respiratory issues at this time.     Your procedure is scheduled on Tuesday, August 12th  Report to [facility] Main Entrance A at  10:30  A.M., then check in with the Admitting office.  Call this number if you have problems the morning of surgery:  [phone]   If you have any questions prior to your surgery date call [phone]: Open Monday-Friday 8am-4pm If you experience any cold or flu symptoms such as cough, fever, chills, shortness of breath, etc. between now and your scheduled surgery, please notify us  at the above number     Remember:  Do not eat or drink after midnight the night before your surgery   Take these medicines the morning of surgery with A SIP OF WATER:  Lipitor, wellbutrin , nexium , flonase , metoprolol , zoloft , valrex  As needed: albuterol , baclofen , valium , nitroglycerin , zofran , nucynta    As of today, STOP taking any Aspirin  (unless otherwise instructed by your surgeon) Aleve, Naproxen, Ibuprofen , Motrin , Advil , Goody's, BC's, all herbal medications, fish oil, and all vitamins.

## 2023-09-27 NOTE — Progress Notes (Signed)
 Cardiology Office Note:  .   Date:  09/27/2023  ID:  [name], DOB November 24, 1960, MRN [id number] PCP:  [name] ORN, MD  Former Cardiology Providers: Dr. Lamar Fitch  [facility] Providers Cardiologist:  Madonna Large, DO , [facility] (established care 02/15/2020) Electrophysiologist:  None  Click to update primary MD,subspecialty MD or APP then REFRESH:1}    Chief Complaint  Patient presents with   Atherosclerosis of native coronary artery of native heart w   Pre-op Exam    History of Present Illness: .   [name] is a 63 y.o. African-American female whose past medical history and cardiovascular risk factors includes: Severe coronary artery calcification, aortic atherosclerosis, moderate CAD per CCTA, hypertension, non-insulin -dependent diabetes mellitus type 2, hyperlipidemia, RBBB, COPD,active tobacco use.   In the past patient presented to the office for chest pain evaluation.  She eventually underwent left heart catheterization which noted diffuse disease in the RCA with occlusion of the mid segment with left-to-right collateral flow.  She was recommended to continue with medical therapy and up titration of antianginal and GDMT.  However, if she is refractory to medical therapy high risk PCI could be considered.  Patient has remained asymptomatic with uptitration of GDMT as well as antianginal therapy since February 2022.  Patient last seen in the office in April 2025 and presents today for follow-up as well as preoperative risk assessment.  APP reached out to the patient for preoperative risk assessment given her upcoming microlaryngoscopy with biopsy with Dr. Gerard Shope at [facility] ENT department.   Patient is accompanied by her significant other, [name] at today's visit.  She provides verbal consent with having them present during today's encounter.  Patient states that she is scheduled for the above-mentioned  procedure/biopsy tomorrow 09/28/2023.  She denies anginal chest pain or heart failure symptoms.  She was smoking 0.5 packs/day and now down to 5 cigarettes/day.  With the intentions to start Chantix .  Patient is no longer on amlodipine , states discontinued by PCP possibly but does not recall the reason.  She is compliant with all daily antianginal therapies.  Overall functional capacity remains stable.  No change in physical endurance.  No use of sublingual nitroglycerin  tablets.  Started Repatha  approximately 2 to 3 weeks ago.   Review of Systems: .   Review of Systems  Cardiovascular:  Negative for chest pain, claudication, irregular heartbeat, leg swelling, near-syncope, orthopnea, palpitations, paroxysmal nocturnal dyspnea and syncope.  Respiratory:  Positive for shortness of breath (chronic and stable).   Hematologic/Lymphatic: Negative for bleeding problem.    Studies Reviewed:   EKG: EKG Interpretation Date/Time:  Monday September 27 2023 09:07:49 EDT Ventricular Rate:  58 PR Interval:  148 QRS Duration:  122 QT Interval:  468 QTC Calculation: 459 R Axis:   80  Text Interpretation: Sinus bradycardia Right bundle branch block When compared with ECG of 08-Jun-2023 08:49, No significant change was found Confirmed by Large Madonna [phone]) on 09/27/2023 9:19:33 AM  Echocardiogram: 08/27/2017: Left ventricle: The cavity size was normal. Systolic function was   normal. The estimated ejection fraction was in the range of 60%   to 65%. Wall motion was normal; there were no regional wall  motion abnormalities.    03/09/2022:  Low-normal LV systolic function with visual EF 50-55%. Left ventricle cavity is normal in size. Normal global wall motion. Doppler evidence of grade I (impaired) diastolic dysfunction, normal LAP. Calculated EF 51%. Structurally normal tricuspid valve with trace  regurgitation. No evidence of pulmonary hypertension.  CTA 03/13/2020: IMPRESSION:  1. Coronary calcium  score  of 1308. This was 99th percentile for age and sex matched control. 2. Normal coronary origin with right dominance. 3. CAD-RADS = 4. Moderate stenosis (50-69%) in the proximal and mid LAD. Moderate stenosis (50-69%) within ostial segment of D1 and D2 branches. Moderate stenosis (50-69%) in the proximal LCx. Moderate stenosis (50-69%) in the ostial RCA and severe stenosis (70-99%) within distal RCA.  4. Aortic atherosclerosis.   CT FFR 03/13/2020: 1. CT FFR analysis showed no significant stenosis within the LAD or LCX distribution. 2. CT FFR analysis showed mid to distal RCA modeled as total occlusion.   Heart Catheterization: Left Heart Catheterization 04/09/20:  LV: Normal LV systolic function, EF 55%, no significant mitral regurgitation.  Normal EDP.  No pressure gradient across the aortic valve. RCA: Is a moderate caliber vessel, it is a dominant vessel, it is diffusely diseased from the midsegment and is 100% occluded.  Faint micro channels are evident to the distal RCA.  There are collaterals evident from the LAD to the RCA. Left main: Calcified.  No significant disease. LAD: Moderate amount of diffuse calcification.  There is no high-grade stenosis.  Gives origin to a large diagonal 1.  Mild to moderate diffuse disease is evident.  LAD gives collaterals to the dominant RCA. RI: Large vessel.  Again has mild to moderate amount of diffuse coronary calcification.  No significant high-grade stenosis. CX: Small to moderate-sized vessel.  Mild diffuse calcific disease evident.   Recommendation: Patient was recommended aggressive medical therapy, smoking cessation.  If she still continues to have angina pectoris, the CTO to the RCA appears amenable for percutaneous coronary revascularization.  We could certainly attempt this.  Distal vessel is diffusely diseased hence at this point I prefer medical therapy but if patient continues to have frequent angina we will proceed with PCI to the right coronary  artery.  60 mL contrast utilized.  RADIOLOGY: NA  Risk Assessment/Calculations:   The 10-year ASCVD risk score (Arnett DK, et al., 2019) is: 18.6%   Values used to calculate the score:     Age: 38 years     Clincally relevant sex: Female     Is Non-Hispanic African American: Yes     Diabetic: Yes     Tobacco smoker: Yes     Systolic Blood Pressure: 120 mmHg     Is BP treated: Yes     HDL Cholesterol: 99 mg/dL     Total Cholesterol: 211 mg/dL  Labs:       Latest Ref Rng & Units 06/14/2023   10:59 AM 02/12/2023   11:08 AM 07/15/2022   11:48 AM  CBC  WBC 4.0 - 10.5 K/uL 4.8  7.2  6.0   Hemoglobin 12.0 - 15.0 g/dL 86.1  85.9  87.1   Hematocrit 36.0 - 46.0 % 41.5  41.8  39.3   Platelets 150.0 - 400.0 K/uL 217.0  273.0  226.0        Latest Ref Rng & Units 06/14/2023   10:59 AM 02/12/2023   11:08 AM 07/15/2022   11:48 AM  BMP  Glucose 70 - 99 mg/dL 92  885  94   BUN 6 - 23 mg/dL 11  19  17    Creatinine 0.40 - 1.20 mg/dL 9.00  8.82  8.93   Sodium 135 - 145 mEq/L 143  137  140   Potassium 3.5 - 5.1 mEq/L 3.5  3.2  3.3   Chloride 96 - 112 mEq/L 104  102  104   CO2 19 - 32 mEq/L 31  25  28    Calcium  8.4 - 10.5 mg/dL 9.5  9.3  9.6       Latest Ref Rng & Units 06/14/2023   10:59 AM 02/12/2023   11:08 AM 07/15/2022   11:48 AM  CMP  Glucose 70 - 99 mg/dL 92  885  94   BUN 6 - 23 mg/dL 11  19  17    Creatinine 0.40 - 1.20 mg/dL 9.00  8.82  8.93   Sodium 135 - 145 mEq/L 143  137  140   Potassium 3.5 - 5.1 mEq/L 3.5  3.2  3.3   Chloride 96 - 112 mEq/L 104  102  104   CO2 19 - 32 mEq/L 31  25  28    Calcium  8.4 - 10.5 mg/dL 9.5  9.3  9.6   Total Protein 6.0 - 8.3 g/dL 7.7  7.2  7.4   Total Bilirubin 0.2 - 1.2 mg/dL 0.3  0.8  0.4   Alkaline Phos 39 - 117 U/L 103  85  86   AST 0 - 37 U/L 25  21  19    ALT 0 - 35 U/L 15  13  15      Lab Results  Component Value Date   CHOL 211 (H) 06/14/2023   HDL 99.00 06/14/2023   LDLCALC 97 06/14/2023   LDLDIRECT 110 (H) 04/03/2020   TRIG  72.0 06/14/2023   CHOLHDL 2 06/14/2023   No results for input(s): LIPOA in the last 8760 hours. No components found for: NTPROBNP No results for input(s): PROBNP in the last 8760 hours. Recent Labs    02/12/23 1108 06/14/23 1059  TSH 1.54 1.77    Physical Exam:    Today's Vitals   09/27/23 0904  BP: 120/78  Pulse: 61  Resp: 16  SpO2: 98%  Weight: 110 lb 12.8 oz (50.3 kg)  Height: 5' 5 (1.651 m)   Body mass index is 18.44 kg/m. Wt Readings from Last 3 Encounters:  09/27/23 110 lb 12.8 oz (50.3 kg)  09/09/23 112 lb 6.4 oz (51 kg)  06/14/23 112 lb (50.8 kg)    Orthostatic VS for the past 72 hrs (Last 3 readings):  Orthostatic BP Patient Position BP Location Cuff Size Orthostatic Pulse  09/27/23 0918 134/89 Standing Left Arm Normal 63  09/27/23 0917 124/82 Sitting Left Arm Normal 63  09/27/23 0915 147/84 Supine Left Arm Normal 58   Physical Exam  Constitutional: No distress.  hemodynamically stable  Neck: No JVD present.  Cardiovascular: Normal rate, regular rhythm, S1 normal and S2 normal. Exam reveals no gallop, no S3 and no S4.  No murmur heard. Pulmonary/Chest: Effort normal and breath sounds normal. No stridor. She has no wheezes. She has no rales.  Musculoskeletal:        General: No edema.     Cervical back: Neck supple.  Skin: Skin is warm.     Impression & Recommendation(s):  Impression:   ICD-10-CM   1. Coronary artery disease of native artery of native heart with stable angina pectoris (HCC)  I25.118     2. Preop cardiovascular exam  Z01.810 EKG 12-Lead    3. Essential hypertension  I10 Comprehensive metabolic panel with GFR    Comprehensive metabolic panel with GFR    CANCELED: Comprehensive metabolic panel with GFR    CANCELED: Comprehensive metabolic panel with GFR  4. Mixed hyperlipidemia  E78.2 Lipid panel    Comprehensive metabolic panel with GFR    Comprehensive metabolic panel with GFR    Lipid panel    CANCELED: Lipid panel     CANCELED: Comprehensive metabolic panel with GFR    CANCELED: Comprehensive metabolic panel with GFR    CANCELED: Lipid panel    5. Cigarette smoker  F17.210        Recommendation(s):  Atherosclerosis of native coronary artery of native heart without angina pectoris Coronary atherosclerosis due to calcified coronary lesion Denies anginal chest pain or heart failure symptoms. No use of sublingual nitroglycerin  tablets since the last office visit. EKG is nonischemic. History of coronary artery disease currently being treated medically.  If refractory to medical therapy high risk PCI can be considered as per the last angiography report.  She is in manage medically since 2022 and has done well overall. Reemphasized importance of complete smoking cessation. Antianginal therapy: Toprol -XL, isosorbide ,sublingual nitroglycerin  tablets Remains on Lipitor 80 mg p.o. daily as well as Repatha  Recommend a goal LDL <55 mg/dL Originally scheduled for an echocardiogram in October 2025, will defer it to 6 months prior to next office visit  Preoperative cardiovascular examination According to the Revised Cardiac Risk Index (RCRI), her Perioperative Risk of Major Cardiac Event is (%): 0.9 Her Functional Capacity in METs is: 5.72 according to the Duke Activity Status Index (DASI). EKG is nonischemic. No use of sublingual nitroglycerin  tablets. Patient has multiple cardiovascular risk factors and known CAD but remains asymptomatic. Patient is optimized from a cardiovascular standpoint and no additional testing is warranted at this time.  Will still consider the patient at least low to moderate risk for upcoming noncardiac surgery due to her comorbidities.  Patient is optimized from a cardiovascular standpoint.  Patient finds her preoperative risk assessment acceptable.  Essential hypertension Office blood pressures are very well-controlled. Orthostatic vital signs are positive for the supine to sitting,  patient is asymptomatic.  Will continue the same medical regimen.  However if symptomatic would recommend transitioning off of BiDil  to Imdur .  Type 2 diabetes mellitus without complication, without long-term current use of insulin  (HCC) Reemphasized importance of glycemic control Currently on ARB, statin therapy,  PCSK9 inhibitors  Mixed hyperlipidemia Continue Lipitor 80 mg p.o. daily. Continue PCSK9 inhibitors. Will recommend her to follow-up with Pharm.D. clinic for lipid management, started PCSK9 inhibitors approximately 4 weeks ago  Cigarette smoker Tobacco cessation counseling: Currently smoking 5 cigarettes /day  She is informed of the dangers of tobacco abuse including stroke, cancer, and MI, as well as benefits of tobacco cessation. She is willing to quit at this time. 7 mins were spent counseling patient cessation techniques. We discussed various methods to help quit smoking, including deciding on a date to quit, joining a support group, pharmacological agents- nicotine  gum/patch/lozenges.  I will reassess her progress at the next follow-up visit  Orders Placed:  Orders Placed This Encounter  Procedures   Lipid panel    Standing Status:   Future    Number of Occurrences:   1    Expected Date:   10/04/2023    Expiration Date:   09/26/2024    CC Results:   MACCIA, MELISSA D [[id number]]   Comprehensive metabolic panel with GFR    Standing Status:   Future    Number of Occurrences:   1    Expected Date:   10/04/2023    Expiration Date:   09/26/2024    CC Results:  MACCIA, MELISSA D [[id number]]   EKG 12-Lead     Final Medication List:    No orders of the defined types were placed in this encounter.   Medications Discontinued During This Encounter  Medication Reason   gabapentin  (NEURONTIN ) 300 MG capsule Patient Preference   methylPREDNISolone  (MEDROL  DOSEPAK) 4 MG TBPK tablet Completed Course     Current Outpatient Medications:    albuterol  (VENTOLIN  HFA) 108 (90  Base) MCG/ACT inhaler, Inhale 2 puffs into the lungs every 6 (six) hours as needed for wheezing or shortness of breath., Disp: 8 g, Rfl: 5   aspirin  EC 81 MG tablet, Take 1 tablet (81 mg total) by mouth daily. Swallow whole., Disp: 90 tablet, Rfl: 3   atorvastatin  (LIPITOR) 80 MG tablet, Take 1 tablet (80 mg total) by mouth daily., Disp: 90 tablet, Rfl: 2   Azelastine  HCl 137 MCG/SPRAY SOLN, Place 1 or 2 sprays per nostril twice daily as needed, Disp: 30 mL, Rfl: 11   baclofen  (LIORESAL ) 10 MG tablet, Take 1 tablet (10 mg total) by mouth 3 (three) times daily as needed., Disp: 90 tablet, Rfl: 1   baclofen  (LIORESAL ) 10 MG tablet, Take 1 tablet (10 mg total) by mouth 3 (three) times daily as needed., Disp: 90 tablet, Rfl: 2   buPROPion  (WELLBUTRIN ) 75 MG tablet, Take 1 tablet (75 mg total) by mouth 2 (two) times daily., Disp: 180 tablet, Rfl: 2   Cholecalciferol  50 MCG (2000 UT) TABS, 1 tab by mouth once daily, Disp: 30 tablet, Rfl: 99   clopidogrel  (PLAVIX ) 75 MG tablet, Take 1 tablet (75 mg total) by mouth daily., Disp: 90 tablet, Rfl: 3   diazepam  (VALIUM ) 5 MG tablet, Take 1 tablet (5 mg total) by mouth 2 (two) times daily as needed., Disp: 60 tablet, Rfl: 2   diclofenac  Sodium (VOLTAREN ) 1 % GEL, APPLY 2 GRAMS TO THE AFFECTED AREA(S) BY TOPICAL ROUTE 4 TIMES PER DAY, Disp: 150 g, Rfl: 3   empagliflozin  (JARDIANCE ) 10 MG TABS tablet, Take 1 tablet (10 mg total) by mouth daily before breakfast., Disp: 90 tablet, Rfl: 3   esomeprazole  (NEXIUM ) 40 MG capsule, Take 1 capsule (40 mg total) by mouth daily., Disp: 90 capsule, Rfl: 2   estradiol  (DOTTI ) 0.05 MG/24HR patch, Place 1 patch (0.05 mg total) onto the skin 2 (two) times a week., Disp: 24 patch, Rfl: 4   Estradiol  10 MCG TABS vaginal tablet, Place 1 tablet (10 mcg total) vaginally 2 (two) times a week., Disp: 8 tablet, Rfl: 11   Evolocumab  (REPATHA  SURECLICK) 140 MG/ML SOAJ, Inject 140 mg into the skin every 14 (fourteen) days., Disp: 2 mL, Rfl:  11   fluticasone  (FLONASE ) 50 MCG/ACT nasal spray, Place 2 sprays into both nostrils daily., Disp: 16 g, Rfl: 11   isosorbide -hydrALAZINE  (BIDIL ) 20-37.5 MG tablet, Take 1 tablet by mouth in the morning and at bedtime., Disp: 180 tablet, Rfl: 3   ketoconazole  (NIZORAL ) 2 % cream, APPLY  CREAM TOPICALLY ONCE DAILY, Disp: 30 g, Rfl: 0   losartan  (COZAAR ) 50 MG tablet, Take 1 tablet (50 mg total) by mouth daily., Disp: 90 tablet, Rfl: 3   metoprolol  succinate (TOPROL -XL) 100 MG 24 hr tablet, Take 1 tablet (100 mg total) by mouth daily. Take with or immediately following a meal., Disp: 90 tablet, Rfl: 1   nitroGLYCERIN  (NITROSTAT ) 0.4 MG SL tablet, Place 1 tablet (0.4 mg total) under the tongue every 5 (five) minutes as needed for chest pain., Disp: 25 tablet, Rfl:  1   NUCYNTA  50 MG tablet, Take 50 mg by mouth 3 (three) times daily as needed for moderate pain or severe pain., Disp: , Rfl:    ondansetron  (ZOFRAN ) 4 MG tablet, Take 1 tablet (4 mg total) by mouth every 8 (eight) hours as needed for nausea or vomiting., Disp: 30 tablet, Rfl: 1   predniSONE  (DELTASONE ) 10 MG tablet, Take 6 tablets by mouth x1 day, 5 tablets x 1 day, 4 tablets x 1 day, 3 tablets x 1 day, 2 tablets x 1 day, 1 tablet x 1 day, Disp: 21 tablet, Rfl: 0   progesterone  (PROMETRIUM ) 100 MG capsule, Take 1 capsule (100 mg total) by mouth daily., Disp: 90 capsule, Rfl: 4   sertraline  (ZOLOFT ) 100 MG tablet, Take 1 tablet (100 mg total) by mouth daily., Disp: 90 tablet, Rfl: 1   silver  sulfADIAZINE  (SILVADENE ) 1 % cream, Apply 1 Application topically daily., Disp: 50 g, Rfl: 1   valACYclovir  (VALTREX ) 500 MG tablet, Take 1 tablet (500 mg total) by mouth daily., Disp: 90 tablet, Rfl: 3   Varenicline  Tartrate, Starter, (CHANTIX  STARTING MONTH PAK) 0.5 MG X 11 & 1 MG X 42 TBPK, Follow instructions on starter kit, Disp: 53 each, Rfl: 0   varenicline  (CHANTIX ) 1 MG tablet, Take 1 tablet (1 mg total) by mouth 2 (two) times daily. Start after  completing the starter pack. (Patient not taking: Reported on 09/27/2023), Disp: 90 tablet, Rfl: 1  Consent:   NA  Disposition:   6 month follow up   Her questions and concerns were addressed to her satisfaction. She voices understanding of the recommendations provided during this encounter.    Signed, Madonna Michele HAS, [facility]  A Division of [facility] [address] [place], [place] [address]  [place], [place] [address] 09/27/2023 9:54 AM

## 2023-09-27 NOTE — Telephone Encounter (Addendum)
 As per Devere Evangelist at [facility] pre-op assesment: Although patient cleared by cardiology today for her surgery- Per anesthesiologist Dr. Gretta, Pt is cancelled  for surgery at surgical center and must be done at hospital, due to significant cardiac comorbidities.  He notified Dr. Llewellyn by text.   Called patient, went to voicemail, left message to inform her the surgery location and time has been changed from [facility] at 9:00 am to [facility] at 1:00 pm with an arrival time of 11:00 am.    Email sent to: [email or web address]  Ms. Werntz,  Please see changes. Your surgical procedure with Dr. Llewellyn will be on Tuesday 09/28/23 at:  [facility] [address] [place], [place] [address] [phone]   Surgery is tentatively scheduled to start at  1:00 pm arrival time will be at 11:00 am. Someone from the [facility] anesthesia team may try to contact with you to get your medical history in the morning or they may wait until you arrive at the hospital tomorrow.  Please remember nothing to eat or drink after midnight unless otherwise authorized by the pre-op anesthesia team. Also, keep in mind all instructions given to you concerning stopping or starting any medications you are taking. Failure to adhere to any special instruction may result in your surgery being canceled.  Your post-op appointment with Dr. Llewellyn is on 10/11/23 at 10:00 am. Please arrive 15 minutes early for check-in  Please call Dr. Inez assistant Sinclair at [phone] if: You have any pharmacy issues or your pharmacy has changed since the last office visit You need FMLA paperwork filled out or a doctor's note  You have any medical questions pertaining to your surgery You have any post-surgical concerns or complications You need to reschedule a post-op appointment   See attachments  Best Regards,  Tobias FABIENE Donalds Surgical Coordinator  [facility]  E[facility], Nose and  Thro[facility] - [place] [address], [address] [place], [place]  [address] Phone [phone]/ Fax [phone]

## 2023-09-27 NOTE — Telephone Encounter (Addendum)
 Per [employer], DO of [facility], patient is at acceptable (low/moderate) risk, from a cardiac standpoint, for her upcoming procedure: microlaryngoscopy with biopsy. If applicable can hold Aspirin  for 7 days prior to procedure and re-start postprocedure when safe as per proceduralist/surgery    ----- Message from Mliss FALCON sent at 09/06/2023  1:58 PM EDT -----  No precert required, you can schedule.   Please schedule for microlaryngoscopy with biopsy, can be done at [facility]  Follow-up 2 weeks postop.  Need clearance from cardiology to hold Plavix  and ASA (Tolia, Sunit).  Not emergent, but would like it done within the next month. ----- Message ----- From: Gerard Jenkins Shope, DO Sent: 09/06/2023   1:42 PM EDT To: [facility] Scheduling  Please schedule for microlaryngoscopy with biopsy, can be done at [facility].  Follow-up 2 weeks postop.  Need clearance from cardiology to hold Plavix  and ASA (Tolia, Sunit).  Not emergent, but would like it done within the next month.

## 2023-09-27 NOTE — Patient Instructions (Signed)
 Medication Instructions:  No medication changes were made at this visit. Continue current regimen.   *If you need a refill on your cardiac medications before your next appointment, please call your pharmacy*  Lab Work: To be completed in 1 week: FASTING lipid panel and CMP  If you have labs (blood work) drawn today and your tests are completely normal, you will receive your results only by: MyChart Message (if you have MyChart) OR A paper copy in the mail If you have any lab test that is abnormal or we need to change your treatment, we will call you to review the results.  Testing/Procedures: You currently have an echocardiogram scheduled for October 2025. Dr. Michele would like to push that to February 2026 prior to his 6 month follow-up appointment.  Follow-Up: At [facility], you and your health needs are our priority.  As part of our continuing mission to provide you with exceptional heart care, our providers are all part of one team.  This team includes your primary Cardiologist (physician) and Advanced Practice Providers or APPs (Physician Assistants and Nurse Practitioners) who all work together to provide you with the care you need, when you need it.  Your next appointment:   6 month(s)  Provider:   Madonna Michele, DO    We recommend signing up for the patient portal called MyChart.  Sign up information is provided on this After Visit Summary.  MyChart is used to connect with patients for Virtual Visits (Telemedicine).  Patients are able to view lab/test results, encounter notes, upcoming appointments, etc.  Non-urgent messages can be sent to your provider as well.   To learn more about what you can do with MyChart, go to [email or web address].   Other Instructions Our office will send a pre-op letter to the office the procedure is occurring through. We will let you know when that is sent.

## 2023-09-28 ENCOUNTER — Encounter (HOSPITAL_COMMUNITY): Payer: Self-pay | Admitting: Anesthesiology

## 2023-09-28 ENCOUNTER — Other Ambulatory Visit: Payer: Self-pay | Admitting: Otolaryngology

## 2023-09-28 ENCOUNTER — Encounter (HOSPITAL_COMMUNITY): Admission: RE | Payer: Self-pay | Source: Home / Self Care

## 2023-09-28 ENCOUNTER — Ambulatory Visit (HOSPITAL_COMMUNITY): Admission: RE | Admit: 2023-09-28 | Source: Home / Self Care | Admitting: Otolaryngology

## 2023-09-28 SURGERY — LARYNGOSCOPY, DIRECT
Anesthesia: General

## 2023-09-28 MED ORDER — CHLORHEXIDINE GLUCONATE 0.12 % MT SOLN: 15.0000 mL | Freq: Once | OROMUCOSAL | Status: DC

## 2023-09-28 MED ORDER — ORAL CARE MOUTH RINSE: 15.0000 mL | Freq: Once | OROMUCOSAL | Status: DC

## 2023-09-28 MED ORDER — LACTATED RINGERS IV SOLN: INTRAVENOUS | Status: AC

## 2023-09-30 ENCOUNTER — Telehealth: Payer: Self-pay | Admitting: Internal Medicine

## 2023-09-30 NOTE — Telephone Encounter (Unsigned)
 Copied from CRM #[id number]. Topic: Clinical - Medication Refill >> Sep 30, 2023  2:49 PM Chasity T wrote: Medication:  ondansetron  (ZOFRAN ) 4 MG tablet albuterol  (VENTOLIN  HFA) 108 (90 Base) MCG/ACT inhaler diazepam  (VALIUM ) 5 MG tablet   Has the patient contacted their pharmacy? No   This is the patient's preferred pharmacy:  [facility] [address] [place] [address] Phone: [phone] Fax: [phone]  Is this the correct pharmacy for this prescription? Yes  Has the prescription been filled recently? No  Is the patient out of the medication? Yes  Has the patient been seen for an appointment in the last year OR does the patient have an upcoming appointment? Yes  Can we respond through MyChart? Yes  Agent: Please be advised that Rx refills may take up to 3 business days. We ask that you follow-up with your pharmacy.

## 2023-09-30 NOTE — Telephone Encounter (Signed)
 Copied from CRM #[id number]. Topic: Clinical - Medication Question >> Sep 30, 2023  2:51 PM Chasity T wrote: Reason for CRM: Patient is wanting to make Dr Norleen aware that she is about to have surgery on her throat and is needing the medication before Aug 20th.

## 2023-10-01 ENCOUNTER — Other Ambulatory Visit: Payer: Self-pay

## 2023-10-01 ENCOUNTER — Other Ambulatory Visit (HOSPITAL_COMMUNITY): Payer: Self-pay

## 2023-10-01 MED ORDER — ONDANSETRON HCL 4 MG PO TABS
4.0000 mg | ORAL_TABLET | Freq: Three times a day (TID) | ORAL | 1 refills | Status: DC | PRN
  Filled 2023-10-01: qty 30, 10d supply, fill #0
  Filled 2023-12-07: qty 30, 10d supply, fill #1

## 2023-10-01 MED ORDER — ALBUTEROL SULFATE HFA 108 (90 BASE) MCG/ACT IN AERS
2.0000 | INHALATION_SPRAY | Freq: Four times a day (QID) | RESPIRATORY_TRACT | 5 refills | Status: AC | PRN
  Filled 2023-10-01: qty 6.7, 25d supply, fill #0
  Filled 2024-03-02: qty 6.7, 25d supply, fill #1

## 2023-10-01 MED ORDER — DIAZEPAM 5 MG PO TABS
5.0000 mg | ORAL_TABLET | Freq: Two times a day (BID) | ORAL | 2 refills | Status: DC | PRN
  Filled 2023-10-01 – 2023-10-08 (×2): qty 60, 30d supply, fill #0
  Filled 2023-11-09: qty 60, 30d supply, fill #1
  Filled 2023-12-07: qty 60, 30d supply, fill #2

## 2023-10-01 NOTE — Telephone Encounter (Signed)
 Medication refill requests has been sent to Dr norleen.

## 2023-10-01 NOTE — Telephone Encounter (Signed)
 Ok all done and sent to pharmacy

## 2023-10-01 NOTE — Telephone Encounter (Signed)
All done erx 

## 2023-10-02 ENCOUNTER — Other Ambulatory Visit (HOSPITAL_COMMUNITY): Payer: Self-pay

## 2023-10-04 NOTE — Anesthesia Preprocedure Evaluation (Signed)
 Anesthesia Evaluation  Patient identified by MRN, date of birth, ID band Patient awake    Reviewed: Allergy & Precautions, H&P , NPO status , Patient's Chart, lab work & pertinent test results, reviewed documented beta blocker date and time   History of Anesthesia Complications (+) PONV and history of anesthetic complications  Airway Mallampati: II  TM Distance: >3 FB Neck ROM: Full    Dental  (+) Teeth Intact, Dental Advisory Given   Pulmonary COPD, Current Smoker Hoarseness, vocal cord dysfunction Current smoker- 4cigg/d, smoked this morning Inhalers used today   Pulmonary exam normal breath sounds clear to auscultation       Cardiovascular hypertension (165/83 preop), Pt. on medications and Pt. on home beta blockers + CAD (plavix  LD >7d)  Normal cardiovascular exam Rhythm:Regular Rate:Normal  Echo 2019 Left ventricle: The cavity size was normal. Systolic function was    normal. The estimated ejection fraction was in the range of 60%    to 65%. Wall motion was normal; there were no regional wall    motion abnormalities.  - Aortic valve: Valve area (Vmax): 1.6 cm^2.    moderate nonobstructive CAD by cath in 2022  follows with Cardiology for HTN and CAD. Patient follows with Dr. Michele, last seen on 09/27/23 for pre op clearance. Patient is on medical management for her CAD since 2022. Noted to be a smoker but asymptomatic from cardiac standpoint   Neuro/Psych  Headaches PSYCHIATRIC DISORDERS Anxiety Depression       GI/Hepatic ,GERD  Controlled,,(+) Hepatitis -Chronic nausea- takes zofran  daily, did not take today    Endo/Other  diabetes, Well Controlled, Type 2  FS 131  Renal/GU negative Renal ROS  negative genitourinary   Musculoskeletal  (+) Arthritis , Osteoarthritis,  Chronic pain- baclofen    Abdominal   Peds negative pediatric ROS (+)  Hematology negative hematology ROS (+)   Anesthesia Other  Findings   Reproductive/Obstetrics negative OB ROS                              Anesthesia Physical Anesthesia Plan  ASA: 3  Anesthesia Plan: General   Post-op Pain Management: Tylenol  PO (pre-op)*   Induction: Intravenous  PONV Risk Score and Plan: 3 and Ondansetron , Dexamethasone , Propofol  infusion, TIVA, Treatment may vary due to age or medical condition and Midazolam   Airway Management Planned: Oral ETT  Additional Equipment: None  Intra-op Plan:   Post-operative Plan: Extubation in OR  Informed Consent: I have reviewed the patients History and Physical, chart, labs and discussed the procedure including the risks, benefits and alternatives for the proposed anesthesia with the patient or authorized representative who has indicated his/her understanding and acceptance.     Dental advisory given  Plan Discussed with: CRNA  Anesthesia Plan Comments: (Airway note 2023: Ventilation: Mask ventilation without difficulty Laryngoscope Size: Mac and 3 Grade View: Grade II Tube type: Oral Tube size: 7.0 mm Number of attempts: 1  See PAT note from 8/19 )         Anesthesia Quick Evaluation

## 2023-10-05 ENCOUNTER — Other Ambulatory Visit: Payer: Self-pay

## 2023-10-05 ENCOUNTER — Other Ambulatory Visit (HOSPITAL_COMMUNITY): Payer: Self-pay

## 2023-10-05 ENCOUNTER — Other Ambulatory Visit: Payer: Self-pay | Admitting: Otolaryngology

## 2023-10-05 ENCOUNTER — Encounter (HOSPITAL_COMMUNITY): Payer: Self-pay | Admitting: Otolaryngology

## 2023-10-05 NOTE — Progress Notes (Signed)
 SDW call  Patient was given pre-op instructions over the phone. Patient verbalized understanding of instructions provided.     PCP - Dr. Lynwood Rush Cardiologist - Dr. Madonna Large Pulmonary:    PPM/ICD - denies Device Orders - na Rep Notified - na   Chest x-ray - 06/14/2023 EKG -  09/27/2023 Stress Test - ECHO - 03/09/2022 Cardiac Cath - 04/09/2020  Sleep Study/sleep apnea/CPAP: denies  Type II diabetic: Does not check her blood sugar. A1C 5.9 on 06/14/2023 Fasting Blood sugar range: na How often check sugars: na Jardiance , instructed to hold DOS   Blood Thinner Instructions: Plavix , states last dose 09/27/2023 Aspirin  Instructions:states last dose 09/27/2023   ERAS Protcol - NPO   Anesthesia review: Yes. CAD, HTN, Angina, COPD, DM, HLD   Patient denies shortness of breath, fever, cough and chest pain over the phone call  Your procedure is scheduled on Wednesday October 06, 2023  Report to [facility] Main Entrance A at  1130  A.M., then check in with the Admitting office.  Call this number if you have problems the morning of surgery:  [phone]   If you have any questions prior to your surgery date call [phone]: Open Monday-Friday 8am-4pm If you experience any cold or flu symptoms such as cough, fever, chills, shortness of breath, etc. between now and your scheduled surgery, please notify us  at the above number    Remember:  Do not eat or drink after midnight the night before your surgery  Take these medicines the morning of surgery with A SIP OF WATER:  Atorvastatin , wellbutrin , zoloft , valtrex , flonase , bidil , metoprolol , progesterone , zoloft , valtrex   As needed: Albuterol , astelin  nasal spray, baclofen , valium , nucynta , zofran   As of today, STOP taking any Aspirin  (unless otherwise instructed by your surgeon) Aleve, Naproxen, Ibuprofen , Motrin , Advil , Goody's, BC's, all herbal medications, fish oil, and all vitamins.
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
## 2023-10-06 ENCOUNTER — Encounter (HOSPITAL_COMMUNITY): Payer: Self-pay | Admitting: Otolaryngology

## 2023-10-06 ENCOUNTER — Other Ambulatory Visit: Payer: Self-pay

## 2023-10-06 ENCOUNTER — Ambulatory Visit (HOSPITAL_BASED_OUTPATIENT_CLINIC_OR_DEPARTMENT_OTHER): Admitting: Anesthesiology

## 2023-10-06 ENCOUNTER — Encounter (HOSPITAL_COMMUNITY)
Admission: RE | Disposition: A | Discharge status: Home / Self Care | Payer: Self-pay | Source: Home / Self Care | Attending: Otolaryngology

## 2023-10-06 ENCOUNTER — Ambulatory Visit (HOSPITAL_COMMUNITY)
Admission: RE | Admit: 2023-10-06 | Discharge: 2023-10-06 | Disposition: A | Discharge status: Home / Self Care | Attending: Otolaryngology | Admitting: Otolaryngology

## 2023-10-06 ENCOUNTER — Ambulatory Visit (HOSPITAL_COMMUNITY): Admitting: Anesthesiology

## 2023-10-06 DIAGNOSIS — Z7902 Long term (current) use of antithrombotics/antiplatelets: Secondary | ICD-10-CM | POA: Diagnosis not present

## 2023-10-06 DIAGNOSIS — F32A Depression, unspecified: Secondary | ICD-10-CM | POA: Insufficient documentation

## 2023-10-06 DIAGNOSIS — J309 Allergic rhinitis, unspecified: Secondary | ICD-10-CM | POA: Diagnosis not present

## 2023-10-06 DIAGNOSIS — J381 Polyp of vocal cord and larynx: Secondary | ICD-10-CM

## 2023-10-06 DIAGNOSIS — Z7984 Long term (current) use of oral hypoglycemic drugs: Secondary | ICD-10-CM | POA: Insufficient documentation

## 2023-10-06 DIAGNOSIS — F419 Anxiety disorder, unspecified: Secondary | ICD-10-CM | POA: Insufficient documentation

## 2023-10-06 DIAGNOSIS — M1990 Unspecified osteoarthritis, unspecified site: Secondary | ICD-10-CM | POA: Insufficient documentation

## 2023-10-06 DIAGNOSIS — Z79899 Other long term (current) drug therapy: Secondary | ICD-10-CM | POA: Diagnosis not present

## 2023-10-06 DIAGNOSIS — I2510 Atherosclerotic heart disease of native coronary artery without angina pectoris: Secondary | ICD-10-CM | POA: Insufficient documentation

## 2023-10-06 DIAGNOSIS — K219 Gastro-esophageal reflux disease without esophagitis: Secondary | ICD-10-CM | POA: Diagnosis not present

## 2023-10-06 DIAGNOSIS — J449 Chronic obstructive pulmonary disease, unspecified: Secondary | ICD-10-CM | POA: Insufficient documentation

## 2023-10-06 DIAGNOSIS — R110 Nausea: Secondary | ICD-10-CM | POA: Insufficient documentation

## 2023-10-06 DIAGNOSIS — G894 Chronic pain syndrome: Secondary | ICD-10-CM | POA: Insufficient documentation

## 2023-10-06 DIAGNOSIS — F17210 Nicotine dependence, cigarettes, uncomplicated: Secondary | ICD-10-CM | POA: Diagnosis not present

## 2023-10-06 DIAGNOSIS — J383 Other diseases of vocal cords: Secondary | ICD-10-CM | POA: Insufficient documentation

## 2023-10-06 DIAGNOSIS — K759 Inflammatory liver disease, unspecified: Secondary | ICD-10-CM | POA: Diagnosis not present

## 2023-10-06 DIAGNOSIS — I10 Essential (primary) hypertension: Secondary | ICD-10-CM | POA: Insufficient documentation

## 2023-10-06 DIAGNOSIS — B49 Unspecified mycosis: Secondary | ICD-10-CM | POA: Diagnosis not present

## 2023-10-06 DIAGNOSIS — E1140 Type 2 diabetes mellitus with diabetic neuropathy, unspecified: Secondary | ICD-10-CM | POA: Diagnosis not present

## 2023-10-06 DIAGNOSIS — R490 Dysphonia: Secondary | ICD-10-CM

## 2023-10-06 HISTORY — PX: MICROLARYNGOSCOPY: SHX5208

## 2023-10-06 LAB — CBC
HCT: 35.3 % — ABNORMAL LOW (ref 36.0–46.0)
Hemoglobin: 11.5 g/dL — ABNORMAL LOW (ref 12.0–15.0)
MCH: 32.2 pg (ref 26.0–34.0)
MCHC: 32.6 g/dL (ref 30.0–36.0)
MCV: 98.9 fL (ref 80.0–100.0)
Platelets: 172 K/uL (ref 150–400)
RBC: 3.57 MIL/uL — ABNORMAL LOW (ref 3.87–5.11)
RDW: 14 % (ref 11.5–15.5)
WBC: 6.2 K/uL (ref 4.0–10.5)
nRBC: 0 % (ref 0.0–0.2)

## 2023-10-06 LAB — GLUCOSE, CAPILLARY
Glucose-Capillary: 131 mg/dL — ABNORMAL HIGH (ref 70–99)
Glucose-Capillary: 115 mg/dL — ABNORMAL HIGH (ref 70–99)
Glucose-Capillary: 111 mg/dL — ABNORMAL HIGH (ref 70–99)

## 2023-10-06 LAB — BASIC METABOLIC PANEL WITH GFR
Anion gap: 12 (ref 5–15)
BUN: 23 mg/dL — ABNORMAL HIGH (ref 6–20)
CO2: 20 mmol/L — ABNORMAL LOW (ref 22–32)
Calcium: 9.5 mg/dL (ref 8.9–10.3)
Chloride: 111 mmol/L (ref 98–111)
Creatinine, Ser: 1.13 mg/dL — ABNORMAL HIGH (ref 0.44–1.00)
GFR, Estimated: 56 mL/min — ABNORMAL LOW (ref >60)
Glucose, Bld: 104 mg/dL — ABNORMAL HIGH (ref 70–99)
Potassium: 3.6 mmol/L (ref 3.5–5.1)
Sodium: 143 mmol/L (ref 135–145)

## 2023-10-06 SURGERY — MICROLARYNGOSCOPY
Anesthesia: General | Site: Mouth

## 2023-10-06 MED ORDER — FENTANYL CITRATE (PF) 250 MCG/5ML IJ SOLN
INTRAMUSCULAR | Status: AC
  Filled 2023-10-06: qty 5

## 2023-10-06 MED ORDER — PROPOFOL 10 MG/ML IV BOLUS
INTRAVENOUS | Status: AC
  Filled 2023-10-06: qty 20

## 2023-10-06 MED ORDER — ONDANSETRON HCL 4 MG/2ML IJ SOLN: 4.0000 mg | Freq: Once | INTRAMUSCULAR | Status: DC | PRN

## 2023-10-06 MED ORDER — EPINEPHRINE HCL (NASAL) 0.1 % NA SOLN
NASAL | Status: AC
Start: 2023-10-06 — End: 2023-10-06
  Filled 2023-10-06: qty 60

## 2023-10-06 MED ORDER — CHLORHEXIDINE GLUCONATE 0.12 % MT SOLN
15.0000 mL | Freq: Once | OROMUCOSAL | Status: AC
  Administered 2023-10-06: 15 mL via OROMUCOSAL
  Filled 2023-10-06: qty 15

## 2023-10-06 MED ORDER — MIDAZOLAM HCL 2 MG/2ML IJ SOLN
INTRAMUSCULAR | Status: DC | PRN
  Administered 2023-10-06: 2 mg via INTRAVENOUS

## 2023-10-06 MED ORDER — ONDANSETRON HCL 4 MG/2ML IJ SOLN
INTRAMUSCULAR | Status: DC | PRN
  Administered 2023-10-06: 4 mg via INTRAVENOUS

## 2023-10-06 MED ORDER — PROPOFOL 10 MG/ML IV BOLUS
INTRAVENOUS | Status: DC | PRN
  Administered 2023-10-06: 50 mg via INTRAVENOUS
  Administered 2023-10-06: 100 mg via INTRAVENOUS

## 2023-10-06 MED ORDER — DEXAMETHASONE SODIUM PHOSPHATE 10 MG/ML IJ SOLN
INTRAMUSCULAR | Status: AC
Start: 2023-10-06 — End: 2023-10-06
  Filled 2023-10-06: qty 1

## 2023-10-06 MED ORDER — ROCURONIUM BROMIDE 10 MG/ML (PF) SYRINGE
PREFILLED_SYRINGE | INTRAVENOUS | Status: DC | PRN
  Administered 2023-10-06: 20 mg via INTRAVENOUS

## 2023-10-06 MED ORDER — ORAL CARE MOUTH RINSE: 15.0000 mL | Freq: Once | OROMUCOSAL | Status: AC

## 2023-10-06 MED ORDER — ROCURONIUM BROMIDE 10 MG/ML (PF) SYRINGE
PREFILLED_SYRINGE | INTRAVENOUS | Status: AC
Start: 2023-10-06 — End: 2023-10-06
  Filled 2023-10-06: qty 10

## 2023-10-06 MED ORDER — SUGAMMADEX SODIUM 200 MG/2ML IV SOLN
INTRAVENOUS | Status: DC | PRN
  Administered 2023-10-06: 200 mg via INTRAVENOUS

## 2023-10-06 MED ORDER — INSULIN ASPART 100 UNIT/ML IJ SOLN: 0.0000 [IU] | INTRAMUSCULAR | Status: DC | PRN

## 2023-10-06 MED ORDER — MIDAZOLAM HCL 2 MG/2ML IJ SOLN
2.0000 mg | Freq: Once | INTRAMUSCULAR | Status: AC
  Administered 2023-10-06: 1 mg via INTRAVENOUS

## 2023-10-06 MED ORDER — SUCCINYLCHOLINE CHLORIDE 200 MG/10ML IV SOSY
PREFILLED_SYRINGE | INTRAVENOUS | Status: DC | PRN
  Administered 2023-10-06: 80 mg via INTRAVENOUS

## 2023-10-06 MED ORDER — MIDAZOLAM HCL 2 MG/2ML IJ SOLN
INTRAMUSCULAR | Status: AC
  Filled 2023-10-06: qty 2

## 2023-10-06 MED ORDER — LACTATED RINGERS IV SOLN: INTRAVENOUS | Status: DC

## 2023-10-06 MED ORDER — SUCCINYLCHOLINE CHLORIDE 200 MG/10ML IV SOSY
PREFILLED_SYRINGE | INTRAVENOUS | Status: AC
  Filled 2023-10-06: qty 10

## 2023-10-06 MED ORDER — PHENYLEPHRINE 80 MCG/ML (10ML) SYRINGE FOR IV PUSH (FOR BLOOD PRESSURE SUPPORT)
PREFILLED_SYRINGE | INTRAVENOUS | Status: DC | PRN
  Administered 2023-10-06 (×2): 80 ug via INTRAVENOUS

## 2023-10-06 MED ORDER — METOPROLOL SUCCINATE ER 25 MG PO TB24
100.0000 mg | ORAL_TABLET | Freq: Once | ORAL | Status: AC
  Administered 2023-10-06: 100 mg via ORAL

## 2023-10-06 MED ORDER — AMISULPRIDE (ANTIEMETIC) 5 MG/2ML IV SOLN
INTRAVENOUS | Status: AC
  Filled 2023-10-06: qty 4

## 2023-10-06 MED ORDER — LIDOCAINE 2% (20 MG/ML) 5 ML SYRINGE
INTRAMUSCULAR | Status: AC
Start: 2023-10-06 — End: 2023-10-06
  Filled 2023-10-06: qty 5

## 2023-10-06 MED ORDER — PROPOFOL 500 MG/50ML IV EMUL
INTRAVENOUS | Status: DC | PRN
  Administered 2023-10-06: 175 ug/kg/min via INTRAVENOUS

## 2023-10-06 MED ORDER — EPINEPHRINE PF 1 MG/ML IJ SOLN
INTRAMUSCULAR | Status: DC | PRN
  Administered 2023-10-06: 10 mg

## 2023-10-06 MED ORDER — FENTANYL CITRATE (PF) 100 MCG/2ML IJ SOLN: 25.0000 ug | INTRAMUSCULAR | Status: DC | PRN

## 2023-10-06 MED ORDER — AMISULPRIDE (ANTIEMETIC) 5 MG/2ML IV SOLN
10.0000 mg | Freq: Once | INTRAVENOUS | Status: AC | PRN
  Administered 2023-10-06: 10 mg via INTRAVENOUS

## 2023-10-06 MED ORDER — FENTANYL CITRATE (PF) 250 MCG/5ML IJ SOLN
INTRAMUSCULAR | Status: DC | PRN
  Administered 2023-10-06: 50 ug via INTRAVENOUS

## 2023-10-06 MED ORDER — DEXAMETHASONE SODIUM PHOSPHATE 10 MG/ML IJ SOLN
INTRAMUSCULAR | Status: DC | PRN
  Administered 2023-10-06: 10 mg via INTRAVENOUS

## 2023-10-06 MED ORDER — ONDANSETRON HCL 4 MG/2ML IJ SOLN
INTRAMUSCULAR | Status: AC
Start: 2023-10-06 — End: 2023-10-06
  Filled 2023-10-06: qty 2

## 2023-10-06 MED ORDER — PHENYLEPHRINE 80 MCG/ML (10ML) SYRINGE FOR IV PUSH (FOR BLOOD PRESSURE SUPPORT)
PREFILLED_SYRINGE | INTRAVENOUS | Status: AC
Start: 2023-10-06 — End: 2023-10-06
  Filled 2023-10-06: qty 10

## 2023-10-06 MED ORDER — ACETAMINOPHEN 500 MG PO TABS
1000.0000 mg | ORAL_TABLET | Freq: Once | ORAL | Status: AC
  Administered 2023-10-06: 1000 mg via ORAL
  Filled 2023-10-06: qty 2

## 2023-10-06 MED ORDER — LIDOCAINE 2% (20 MG/ML) 5 ML SYRINGE
INTRAMUSCULAR | Status: DC | PRN
  Administered 2023-10-06: 50 mg via INTRAVENOUS

## 2023-10-06 MED ORDER — METOPROLOL SUCCINATE ER 25 MG PO TB24
ORAL_TABLET | ORAL | Status: AC
  Filled 2023-10-06: qty 4

## 2023-10-06 SURGICAL SUPPLY — 21 items
BALLN PULMONARY 10-12 (MISCELLANEOUS) IMPLANT
BALLOON PULM 12 13.5 15X75 (BALLOONS) IMPLANT
CANISTER SUCTION 3000ML PPV (SUCTIONS) ×1 IMPLANT
CNTNR URN SCR LID CUP LEK RST (MISCELLANEOUS) IMPLANT
COVER BACK TABLE 60X90IN (DRAPES) ×1 IMPLANT
COVER MAYO STAND STRL (DRAPES) ×1 IMPLANT
DRAPE HALF SHEET 40X57 (DRAPES) ×1 IMPLANT
DRSG TELFA 3X8 NADH STRL (GAUZE/BANDAGES/DRESSINGS) IMPLANT
GLOVE SURG ENC MOIS LTX SZ6.5 (GLOVE) ×1 IMPLANT
GOWN STRL REUS W/ TWL LRG LVL3 (GOWN DISPOSABLE) IMPLANT
KIT BASIN OR (CUSTOM PROCEDURE TRAY) ×1 IMPLANT
KIT TURNOVER KIT B (KITS) ×1 IMPLANT
NS IRRIG 1000ML POUR BTL (IV SOLUTION) ×1 IMPLANT
PATTIES SURGICAL .5 X3 (DISPOSABLE) ×1 IMPLANT
POSITIONER HEAD DONUT 9IN (MISCELLANEOUS) IMPLANT
SOL ANTI FOG 6CC (MISCELLANEOUS) ×1 IMPLANT
SURGILUBE 2OZ TUBE FLIPTOP (MISCELLANEOUS) IMPLANT
SUT SILK 2 0 PERMA HAND 18 BK (SUTURE) IMPLANT
TOWEL GREEN STERILE FF (TOWEL DISPOSABLE) ×1 IMPLANT
TUBE CONNECTING 12X1/4 (SUCTIONS) ×1 IMPLANT
WATER STERILE IRR 1000ML POUR (IV SOLUTION) ×1 IMPLANT

## 2023-10-06 NOTE — Discharge Instructions (Signed)
[facility] POST OP INSTRUCTIONS: LARYNGOSCOPY  The Surgery Itself Laryngoscopy with biopsy or injection involves a brief general anesthesia, typically for  less than one hour. Patients may be sedated for several hours after surgery and may  remain sleepy for the better part of the day. Nausea and vomiting are occasionally seen,  and usually resolve by the evening of surgery - even without additional medications.  Almost all patients can go home the day of surgery.  After Surgery ? You will have a sore throat from the metal instruments used to allow a good view  of your voice box for 3-5 days after surgery. Some patients may have sores on  the tongue or in the mouth. This is normal and you will be given pain  medications for this. If you have sores in the mouth, avoid citrus or acidic  foods/drinks since they will burn.  ? Avoid coughing or frequent throat clearing. Drinking water can help alleviate the  urge to clear the throat. ? Avoid any heavy lifting (more than 20 lbs), straining, exercise, or sports activities  for 2 weeks after surgery. ? Avoid alcohol, tobacco products, spicy foods, or eating late at night as these may  cause heartburn or stomach reflux and may delay the healing process. ? Your voice may be hoarse after surgery from swelling of the vocal cords caused  by manipulation. ? You do not have to avoid talking unless your surgeon gives you specific  instructions. Use your normal voice since whispering is harder on your vocal  cords then talking at a regular volume. ? If your surgeon advises voice rest, you should do the following: o You are to have absolute voice rest for 7 days. You may want to purchase  a dry erase board to communicate during this time. After that, you may  begin to use your voice at a soft spoken level. o You should only speak loud enough for people to hear you that are within  an arm's length away. Do not yell or whisper. You may increase your   voice use by 5 minutes/hour each day after the first 7 days of rest.  Medications ? Pain medication can be used for pain as prescribed. Pain in the throat and the  tongue is normal. ? Some patients will be given steroids or acid reducing medications after surgery,  take these as directed. ? Take all of your routine medications as prescribed, unless told otherwise by your  surgeon. Any medications that thin the blood should be avoided unless approved  by your surgeon.  ? IT IS OK TO TAKE OVER THE COUNTER PAIN MEDICATION  (IBUPROFEN, NAPROXEN, or ACETAMINOPHEN) IN ADDITION TO  YOUR PRESCRIBED MEDICATIONS. DO NOT TAKE ASPIRIN UNLESS  CLEARED WITH YOUR SURGEON.  ? Limit Acetaminophen/Tylenol to less than 4,000mg/day  ? Limit Ibuprofen/Motrin to less than 3,600mg/day  Final Result ? Following vocal cord injection, the voice may seem "strangled" due to swelling for  a few days or weeks. This is normal.  ? If you have a biopsy in surgery, you will usually find out the pathology results  when you are seen in the office for follow up. ? Many patients benefit from voice therapy after surgery to improve the long term  result. Your surgeon will recommend this if you could benefit from it  

## 2023-10-06 NOTE — Anesthesia Postprocedure Evaluation (Signed)
 Anesthesia Post Note  Patient: [name]  Procedure(s) Performed: MICROLARYNGOSCOPY, WITH BIOPSY (Mouth)     Patient location during evaluation: PACU Anesthesia Type: General Level of consciousness: awake and alert, oriented and patient cooperative Pain management: pain level controlled Vital Signs Assessment: post-procedure vital signs reviewed and stable Respiratory status: spontaneous breathing, nonlabored ventilation and respiratory function stable Cardiovascular status: blood pressure returned to baseline and stable Postop Assessment: no apparent nausea or vomiting Anesthetic complications: no   No notable events documented.  Last Vitals:  Vitals:   10/06/23 1645 10/06/23 1700  BP: (!) 137/90 (!) 139/97  Pulse: 80 68  Resp: 18 16  Temp:  36.6 C  SpO2: 98% 96%    Last Pain:  Vitals:   10/06/23 1700  PainSc: 0-No pain                 Almarie HERO Leelynn Whetsel
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
## 2023-10-06 NOTE — Op Note (Signed)
 OPERATIVE NOTE  [name] Date/Time of Admission: 10/06/2023 10:57 AM  CSN: [id number];MRN:[id number] Attending Provider: Llewellyn Sayres A, DO Room/Bed: MCPO/NONE DOB: May 20, 1960 Age: 63 y.o.   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 Hoarseness of voice  Procedure: Procedure(s): MICROLARYNGOSCOPY, WITH BIOPSY  Anesthesia: General  Surgeon(s): Sayres DELENA Llewellyn, DO  Staff: Circulator: Primus Leita HERO, RN Scrub Person: Sherrine Moats, RN Circulator Assistant: Shona Greig DASEN, RN  Implants: * No implants in log *  Specimens: ID Type Source Tests Collected by Time Destination  1 : Right True Vocal Cords Tissue PATH ENT biopsy SURGICAL PATHOLOGY Wallace Gappa A, DO 10/06/2023 1549     Complications: None  EBL: <1 ML  Condition: stable  Operative Findings:  Reinke's edema of bilateral true cords, small area of apparent leukoplakia along margin of right true vocal fold  Description of Operation: Once operative consent was obtained, and the surgical site confirmed with the operating room team, the patient was brought back to the operating room and general endotracheal anesthesia was obtained. The patient was turned over to the ENT service. An operating laryngoscope was used to directly visualize the upper airway and glottis. All anatomic areas from the oral cavity to the glottis were examined and noted to be normal with only exceptions noted in this report. Areas examined included the oropharynx, vallecula, both surfaces of the epiglottis, glottis, post cricoid region and bilateral pyriform sinuses.   The patient was placed in laryngeal suspension with the focus on the glottis with the ET tube intact.   An operating microscope was used to visualize the vocal cords and biopsies were taken from the area of concern. Hemostasis was  obtained with an adrenaline soaked pledget.   An oral gastric tube was placed into the stomach and suctioned to reduce postoperative nausea. The patient was turned back over to the anesthesia service. The patient was transferred to the PACU in stable condition.     Sayres DELENA Llewellyn, DO [facility]  10/06/2023

## 2023-10-06 NOTE — Anesthesia Procedure Notes (Signed)
 Procedure Name: Intubation Date/Time: 10/06/2023 3:31 PM  Performed by: Alen Motto D, CRNAPre-anesthesia Checklist: Patient identified, Emergency Drugs available, Suction available and Patient being monitored Patient Re-evaluated:Patient Re-evaluated prior to induction Oxygen Delivery Method: Circle system utilized Preoxygenation: Pre-oxygenation with 100% oxygen Induction Type: IV induction Ventilation: Mask ventilation without difficulty Laryngoscope Size: Glidescope and 3 Grade View: Grade I Tube type: MLT Tube size: 6.0 mm Number of attempts: 1 Airway Equipment and Method: Stylet and Oral airway Placement Confirmation: ETT inserted through vocal cords under direct vision, positive ETCO2 and breath sounds checked- equal and bilateral Secured at: 22 cm Tube secured with: Tape Dental Injury: Teeth and Oropharynx as per pre-operative assessment

## 2023-10-06 NOTE — Transfer of Care (Signed)
 Immediate Anesthesia Transfer of Care Note  Patient: [name]  Procedure(s) Performed: MICROLARYNGOSCOPY, WITH BIOPSY (Mouth)  Patient Location: PACU  Anesthesia Type:General  Level of Consciousness: awake and alert   Airway & Oxygen Therapy: Patient Spontanous Breathing and Patient connected to face mask oxygen  Post-op Assessment: Report given to RN and Post -op Vital signs reviewed and stable  Post vital signs: Reviewed and stable  Last Vitals:  Vitals Value Taken Time  BP 131/80 10/06/23 16:29  Temp    Pulse 85 10/06/23 16:31  Resp 22 10/06/23 16:31  SpO2 97 % 10/06/23 16:31  Vitals shown include unfiled device data.  Last Pain:  Vitals:   10/06/23 1229  PainSc: 5       Patients Stated Pain Goal: 5 (10/06/23 1229)  Complications: No notable events documented.

## 2023-10-07 ENCOUNTER — Encounter (HOSPITAL_COMMUNITY): Payer: Self-pay | Admitting: Otolaryngology

## 2023-10-07 LAB — SURGICAL PATHOLOGY

## 2023-10-08 ENCOUNTER — Other Ambulatory Visit (HOSPITAL_COMMUNITY): Payer: Self-pay

## 2023-10-11 ENCOUNTER — Other Ambulatory Visit: Payer: Self-pay | Admitting: Internal Medicine

## 2023-10-11 ENCOUNTER — Other Ambulatory Visit: Payer: Self-pay

## 2023-10-11 ENCOUNTER — Other Ambulatory Visit (HOSPITAL_COMMUNITY): Payer: Self-pay

## 2023-10-11 DIAGNOSIS — I209 Angina pectoris, unspecified: Secondary | ICD-10-CM

## 2023-10-11 MED ORDER — METOPROLOL SUCCINATE ER 100 MG PO TB24
100.0000 mg | ORAL_TABLET | Freq: Every day | ORAL | 1 refills | Status: AC
  Filled 2023-10-11: qty 90, 90d supply, fill #0
  Filled 2024-03-02: qty 90, 90d supply, fill #1

## 2023-10-11 NOTE — Telephone Encounter (Signed)
 Copied from CRM #[id number]. Topic: Clinical - Medication Refill >> Oct 11, 2023  4:22 PM Jasmin G wrote: Medication: metoprolol  succinate (TOPROL -XL) 100 MG 24 hr tablet  Has the patient contacted their pharmacy? Yes (Agent: If no, request that the patient contact the pharmacy for the refill. If patient does not wish to contact the pharmacy document the reason why and proceed with request.) (Agent: If yes, when and what did the pharmacy advise?)  This is the patient's preferred pharmacy:  [place] [facility] [address] [place] [address] Phone: [phone] Fax: [phone]  Is this the correct pharmacy for this prescription? Yes If no, delete pharmacy and type the correct one.   Has the prescription been filled recently? Yes  Is the patient out of the medication? Yes  Has the patient been seen for an appointment in the last year OR does the patient have an upcoming appointment? Yes  Can we respond through MyChart? No  Agent: Please be advised that Rx refills may take up to 3 business days. We ask that you follow-up with your pharmacy.

## 2023-10-14 ENCOUNTER — Telehealth: Payer: Self-pay | Admitting: Cardiology

## 2023-10-14 ENCOUNTER — Other Ambulatory Visit (HOSPITAL_COMMUNITY): Payer: Self-pay

## 2023-10-14 DIAGNOSIS — I2510 Atherosclerotic heart disease of native coronary artery without angina pectoris: Secondary | ICD-10-CM

## 2023-10-14 MED ORDER — NITROGLYCERIN 0.4 MG SL SUBL
0.4000 mg | SUBLINGUAL_TABLET | SUBLINGUAL | 3 refills | Status: AC | PRN
  Filled 2023-10-14: qty 25, 8d supply, fill #0

## 2023-10-14 NOTE — Telephone Encounter (Signed)
 RX sent in

## 2023-10-14 NOTE — Telephone Encounter (Signed)
*  STAT* If patient is at the pharmacy, call can be transferred to refill team.   1. Which medications need to be refilled? (please list name of each medication and dose if known)   nitroGLYCERIN  (NITROSTAT ) 0.4 MG SL tablet    2. Which pharmacy/location (including street and city if local pharmacy) is medication to be sent to?  [place] [facility]    3. Do they need a 30 day or 90 day supply? 90

## 2023-10-15 ENCOUNTER — Other Ambulatory Visit: Payer: Self-pay

## 2023-10-19 ENCOUNTER — Other Ambulatory Visit (HOSPITAL_COMMUNITY): Payer: Self-pay

## 2023-10-22 ENCOUNTER — Other Ambulatory Visit (HOSPITAL_COMMUNITY): Payer: Self-pay

## 2023-10-25 ENCOUNTER — Other Ambulatory Visit: Payer: Self-pay

## 2023-10-27 ENCOUNTER — Other Ambulatory Visit (HOSPITAL_COMMUNITY): Payer: Self-pay

## 2023-10-27 DIAGNOSIS — J381 Polyp of vocal cord and larynx: Secondary | ICD-10-CM | POA: Diagnosis not present

## 2023-10-27 DIAGNOSIS — B3789 Other sites of candidiasis: Secondary | ICD-10-CM | POA: Diagnosis not present

## 2023-10-27 MED ORDER — FLUCONAZOLE 200 MG PO TABS
ORAL_TABLET | ORAL | 0 refills | Status: AC
  Filled 2023-10-27 – 2023-11-03 (×2): qty 8, 15d supply, fill #0

## 2023-11-03 ENCOUNTER — Other Ambulatory Visit (HOSPITAL_COMMUNITY): Payer: Self-pay

## 2023-11-09 ENCOUNTER — Other Ambulatory Visit: Payer: Self-pay

## 2023-11-09 ENCOUNTER — Other Ambulatory Visit (HOSPITAL_COMMUNITY): Payer: Self-pay

## 2023-11-11 ENCOUNTER — Other Ambulatory Visit: Payer: Self-pay | Admitting: Internal Medicine

## 2023-11-11 NOTE — Telephone Encounter (Unsigned)
 Copied from CRM [id number]. Topic: Clinical - Medication Refill >> Nov 11, 2023  5:15 PM Chiquita SQUIBB wrote: Medication:  ketoconazole  ketoconazole  (NIZORAL ) 2 % cream   Has the patient contacted their pharmacy? Yes (Agent: If no, request that the patient contact the pharmacy for the refill. If patient does not wish to contact the pharmacy document the reason why and proceed with request.) (Agent: If yes, when and what did the pharmacy advise?)  This is the patient's preferred pharmacy:  [place] [facility] [address] [place] [address] Phone: [phone] Fax: [phone]  Is this the correct pharmacy for this prescription? Yes If no, delete pharmacy and type the correct one.   Has the prescription been filled recently? Yes  Is the patient out of the medication? Yes  Has the patient been seen for an appointment in the last year OR does the patient have an upcoming appointment? Yes  Can we respond through MyChart? Yes  Agent: Please be advised that Rx refills may take up to 3 business days. We ask that you follow-up with your pharmacy.

## 2023-11-12 ENCOUNTER — Other Ambulatory Visit (HOSPITAL_COMMUNITY): Payer: Self-pay

## 2023-11-12 ENCOUNTER — Telehealth: Payer: Self-pay | Admitting: Radiology

## 2023-11-12 ENCOUNTER — Other Ambulatory Visit: Payer: Self-pay

## 2023-11-12 MED ORDER — KETOCONAZOLE 2 % EX CREA
1.0000 | TOPICAL_CREAM | Freq: Every day | CUTANEOUS | 0 refills | Status: DC
  Filled 2023-11-12: qty 30, 30d supply, fill #0

## 2023-11-12 NOTE — Telephone Encounter (Signed)
 Copied from CRM [id number]. Topic: Clinical - Medical Advice >> Nov 11, 2023  5:17 PM Chiquita SQUIBB wrote: Reason for CRM: Patient is asking if Dr. Norleen can review her lab work from 08/20 she stated it was all abnormal and she is concerned about it and is asking if she should do anything in the mean time until her appointment.

## 2023-11-16 NOTE — Telephone Encounter (Signed)
 Fortunately there does not seem to be any new significant problems requiring f/u at this time , after review of aug 20 labs

## 2023-11-18 ENCOUNTER — Ambulatory Visit (HOSPITAL_COMMUNITY): Attending: Cardiology

## 2023-11-18 ENCOUNTER — Telehealth (HOSPITAL_COMMUNITY): Payer: Self-pay | Admitting: Cardiology

## 2023-11-18 NOTE — Telephone Encounter (Signed)
 Patient NO SHOWED echocardiogram on 11/18/23 even after she verbally confirmed appointment with Imaging Admin Assistant on 11/17/23. NO FOLLOW UP MADE WITH PT DUE TO NO SHOW RATE/LBW PER POLICY. Order will be removed from the echo WQ. Thank you.

## 2023-11-19 NOTE — Telephone Encounter (Signed)
 Called and let Pt know, She states understanding no further questions at this time.

## 2023-12-07 ENCOUNTER — Other Ambulatory Visit (HOSPITAL_COMMUNITY): Payer: Self-pay

## 2023-12-07 ENCOUNTER — Other Ambulatory Visit: Payer: Self-pay

## 2023-12-14 ENCOUNTER — Ambulatory Visit: Admitting: Internal Medicine

## 2023-12-23 ENCOUNTER — Encounter: Payer: Self-pay | Admitting: Internal Medicine

## 2023-12-23 ENCOUNTER — Ambulatory Visit: Payer: Self-pay | Admitting: Internal Medicine

## 2023-12-23 ENCOUNTER — Ambulatory Visit: Admitting: Internal Medicine

## 2023-12-23 VITALS — BP 122/70 | HR 60 | Temp 98.4°F | Ht 65.0 in | Wt 108.0 lb

## 2023-12-23 DIAGNOSIS — F17200 Nicotine dependence, unspecified, uncomplicated: Secondary | ICD-10-CM | POA: Diagnosis not present

## 2023-12-23 DIAGNOSIS — Z23 Encounter for immunization: Secondary | ICD-10-CM | POA: Diagnosis not present

## 2023-12-23 DIAGNOSIS — E559 Vitamin D deficiency, unspecified: Secondary | ICD-10-CM

## 2023-12-23 DIAGNOSIS — N1831 Chronic kidney disease, stage 3a: Secondary | ICD-10-CM

## 2023-12-23 DIAGNOSIS — E1140 Type 2 diabetes mellitus with diabetic neuropathy, unspecified: Secondary | ICD-10-CM | POA: Diagnosis not present

## 2023-12-23 DIAGNOSIS — E1122 Type 2 diabetes mellitus with diabetic chronic kidney disease: Secondary | ICD-10-CM | POA: Diagnosis not present

## 2023-12-23 DIAGNOSIS — E782 Mixed hyperlipidemia: Secondary | ICD-10-CM

## 2023-12-23 DIAGNOSIS — Z7984 Long term (current) use of oral hypoglycemic drugs: Secondary | ICD-10-CM

## 2023-12-23 DIAGNOSIS — I10 Essential (primary) hypertension: Secondary | ICD-10-CM

## 2023-12-23 LAB — BASIC METABOLIC PANEL WITH GFR
BUN: 29 mg/dL — ABNORMAL HIGH (ref 6–23)
CO2: 29 meq/L (ref 19–32)
Calcium: 9.5 mg/dL (ref 8.4–10.5)
Chloride: 102 meq/L (ref 96–112)
Creatinine, Ser: 1.27 mg/dL — ABNORMAL HIGH (ref 0.40–1.20)
GFR: 45.84 mL/min — ABNORMAL LOW (ref >60.00)
Glucose, Bld: 104 mg/dL — ABNORMAL HIGH (ref 70–99)
Potassium: 4.3 meq/L (ref 3.5–5.1)
Sodium: 139 meq/L (ref 135–145)

## 2023-12-23 LAB — LIPID PANEL
Cholesterol: 131 mg/dL (ref 0–200)
HDL: 94.3 mg/dL (ref >39.00)
LDL Cholesterol: 21 mg/dL (ref 0–99)
NonHDL: 36.71
Total CHOL/HDL Ratio: 1
Triglycerides: 81 mg/dL (ref 0.0–149.0)
VLDL: 16.2 mg/dL (ref 0.0–40.0)

## 2023-12-23 LAB — HEPATIC FUNCTION PANEL
ALT: 18 U/L (ref 0–35)
AST: 35 U/L (ref 0–37)
Albumin: 4.6 g/dL (ref 3.5–5.2)
Alkaline Phosphatase: 95 U/L (ref 39–117)
Bilirubin, Direct: 0.2 mg/dL (ref 0.0–0.3)
Total Bilirubin: 0.8 mg/dL (ref 0.2–1.2)
Total Protein: 7.5 g/dL (ref 6.0–8.3)

## 2023-12-23 LAB — HEMOGLOBIN A1C: Hgb A1c MFr Bld: 6.4 % (ref 4.6–6.5)

## 2023-12-23 NOTE — Assessment & Plan Note (Signed)
 Lab Results  Component Value Date   LDLCALC 21 12/23/2023   Stable, pt to continue current statin lipitor 80 every day, and repatha  140 biweekly

## 2023-12-23 NOTE — Assessment & Plan Note (Signed)
 Pt counsled to quit, pt not ready

## 2023-12-23 NOTE — Assessment & Plan Note (Signed)
 BP Readings from Last 3 Encounters:  12/23/23 122/70  10/06/23 (!) 139/97  09/27/23 120/78   Stable, pt to continue medical treatment losartan  50 mg every day, toprol  xl 100 qd
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
## 2023-12-23 NOTE — Assessment & Plan Note (Signed)
 Ckd3a Lab Results  Component Value Date   CREATININE 1.27 (H) 12/23/2023   Stable overall, cont to avoid nephrotoxins  Lab Results  Component Value Date   HGBA1C 6.4 12/23/2023   Stable, pt to continue current medical treatment jardiance  10 qd

## 2023-12-23 NOTE — Assessment & Plan Note (Signed)
 Last vitamin D  Lab Results  Component Value Date   VD25OH 32.04 06/14/2023   Low, to start oral replacement

## 2023-12-23 NOTE — Patient Instructions (Signed)
 You had the flu shot today  Please continue all other medications as before, and refills have been done if requested.  Please have the pharmacy call with any other refills you may need.  Please continue your efforts at being more active, low cholesterol diet  Please keep your appointments with your specialists as you may have planned  - cardiology and ENT  Please go to the LAB at the blood drawing area for the tests to be done  You will be contacted by phone if any changes need to be made immediately.  Otherwise, you will receive a letter about your results with an explanation, but please check with MyChart first.  Please make an Appointment to return in 6 months, or sooner if needed

## 2023-12-28 ENCOUNTER — Other Ambulatory Visit: Payer: Self-pay

## 2023-12-28 ENCOUNTER — Other Ambulatory Visit: Payer: Self-pay | Admitting: Internal Medicine

## 2023-12-28 ENCOUNTER — Telehealth: Payer: Self-pay | Admitting: Internal Medicine

## 2023-12-28 ENCOUNTER — Ambulatory Visit (HOSPITAL_COMMUNITY)
Admission: RE | Admit: 2023-12-28 | Discharge: 2023-12-28 | Disposition: A | Discharge status: Home / Self Care | Source: Ambulatory Visit | Attending: Cardiovascular Disease | Admitting: Cardiovascular Disease

## 2023-12-28 ENCOUNTER — Encounter (HOSPITAL_COMMUNITY): Payer: Self-pay

## 2023-12-28 ENCOUNTER — Telehealth (HOSPITAL_COMMUNITY): Payer: Self-pay | Admitting: Cardiology

## 2023-12-28 ENCOUNTER — Other Ambulatory Visit (HOSPITAL_COMMUNITY): Payer: Self-pay

## 2023-12-28 DIAGNOSIS — E119 Type 2 diabetes mellitus without complications: Secondary | ICD-10-CM

## 2023-12-28 DIAGNOSIS — I2510 Atherosclerotic heart disease of native coronary artery without angina pectoris: Secondary | ICD-10-CM

## 2023-12-28 MED ORDER — DIAZEPAM 5 MG PO TABS
5.0000 mg | ORAL_TABLET | Freq: Two times a day (BID) | ORAL | 2 refills | Status: AC | PRN
  Filled 2023-12-28 – 2024-01-04 (×2): qty 60, 30d supply, fill #0
  Filled 2024-02-03 – 2024-02-04 (×2): qty 60, 30d supply, fill #1
  Filled 2024-03-06 – 2024-03-13 (×2): qty 60, 30d supply, fill #2

## 2023-12-28 NOTE — Telephone Encounter (Signed)
 fyi

## 2023-12-28 NOTE — Telephone Encounter (Signed)
 Patient came for scheduled echocardiogram on 12/28/23/ Patient was very nasty with the check in staff when arrived. (Per [place] and [employer]. Patient came up to Johana and stated that it was 3:07 and she was leaving. Her appt was at 3:05.  Patient left without being seen. Order will be removed from the echo WQ. Thank you.

## 2023-12-28 NOTE — Telephone Encounter (Unsigned)
 Copied from CRM [id number]. Topic: Clinical - Medication Refill >> Dec 28, 2023  2:02 PM Harlene ORN wrote: Medication: albuterol  (VENTOLIN  HFA) 108 (90 Base) MCG/ACT inhaler aspirin  EC 81 MG tablet atorvastatin  (LIPITOR) 80 MG tablet Azelastine  HCl 137 MCG/SPRAY SOLN baclofen  (LIORESAL ) 10 MG tablet baclofen  (LIORESAL ) 10 MG tablet buPROPion  (WELLBUTRIN ) 75 MG tablet Cholecalciferol  50 MCG (2000 UT) TABS clopidogrel  (PLAVIX ) 75 MG tablet diazepam  (VALIUM ) 5 MG tablet diclofenac  Sodium (VOLTAREN ) 1 % GEL empagliflozin  (JARDIANCE ) 10 MG TABS tablet esomeprazole  (NEXIUM ) 40 MG capsule estradiol  (DOTTI ) 0.05 MG/24HR patch Estradiol  10 MCG TABS vaginal tablet Evolocumab  (REPATHA  SURECLICK) 140 MG/ML SOAJ fluticasone  (FLONASE ) 50 MCG/ACT nasal spray isosorbide -hydrALAZINE  (BIDIL ) 20-37.5 MG tablet ketoconazole  (NIZORAL ) 2 % cream Gabapentin  300 MG Tablets losartan  (COZAAR ) 50 MG tablet metoprolol  succinate (TOPROL -XL) 100 MG 24 hr tablet nitroGLYCERIN  (NITROSTAT ) 0.4 MG SL tablet NUCYNTA  50 MG tablet ondansetron  (ZOFRAN ) 4 MG tablet progesterone  (PROMETRIUM ) 100 MG capsule sertraline  (ZOLOFT ) 100 MG tablet valACYclovir  (VALTREX ) 500 MG tablet varenicline  (CHANTIX ) 1 MG tablet Varenicline  Tartrate, Starter, (CHANTIX  STARTING MONTH PAK) 0.5 MG X 11 & 1 MG X 42 TBPK    Has the patient contacted their pharmacy? Yes (Agent: If no, request that the patient contact the pharmacy for the refill. If patient does not wish to contact the pharmacy document the reason why and proceed with request.) (Agent: If yes, when and what did the pharmacy advise?)  This is the patient's preferred pharmacy:  [facility] [address] [place] [address] Phone: [phone] Fax: [phone]  Is this the correct pharmacy for this prescription? Yes If no, delete pharmacy and type the correct one.   Has the prescription been filled recently? No  Is the patient out of  the medication? No  Has the patient been seen for an appointment in the last year OR does the patient have an upcoming appointment? Yes  Can we respond through MyChart? Yes  Agent: Please be advised that Rx refills may take up to 3 business days. We ask that you follow-up with your pharmacy.

## 2023-12-29 ENCOUNTER — Other Ambulatory Visit (HOSPITAL_COMMUNITY): Payer: Self-pay

## 2023-12-29 ENCOUNTER — Other Ambulatory Visit: Payer: Self-pay

## 2023-12-29 DIAGNOSIS — F32A Depression, unspecified: Secondary | ICD-10-CM

## 2023-12-29 MED ORDER — ATORVASTATIN CALCIUM 80 MG PO TABS
80.0000 mg | ORAL_TABLET | Freq: Every day | ORAL | 2 refills | Status: AC
  Filled 2023-12-29 – 2024-02-06 (×2): qty 90, 90d supply, fill #0

## 2023-12-29 MED ORDER — BUPROPION HCL 75 MG PO TABS
75.0000 mg | ORAL_TABLET | Freq: Two times a day (BID) | ORAL | 2 refills | Status: AC
  Filled 2023-12-29 – 2024-02-06 (×2): qty 180, 90d supply, fill #0

## 2023-12-29 MED ORDER — ONDANSETRON HCL 4 MG PO TABS
4.0000 mg | ORAL_TABLET | Freq: Three times a day (TID) | ORAL | 1 refills | Status: AC | PRN
  Filled 2023-12-29: qty 30, 10d supply, fill #0

## 2023-12-29 MED ORDER — ESOMEPRAZOLE MAGNESIUM 40 MG PO CPDR
40.0000 mg | DELAYED_RELEASE_CAPSULE | Freq: Every day | ORAL | 2 refills | Status: AC
  Filled 2023-12-29 – 2024-02-06 (×2): qty 90, 90d supply, fill #0

## 2023-12-29 MED ORDER — SERTRALINE HCL 100 MG PO TABS
100.0000 mg | ORAL_TABLET | Freq: Every day | ORAL | 1 refills | Status: AC
  Filled 2023-12-29: qty 90, 90d supply, fill #0

## 2023-12-29 MED ORDER — KETOCONAZOLE 2 % EX CREA
1.0000 | TOPICAL_CREAM | Freq: Every day | CUTANEOUS | 0 refills | Status: AC
  Filled 2023-12-29 (×2): qty 30, 30d supply, fill #0

## 2023-12-29 NOTE — Telephone Encounter (Signed)
 The medications that needed refills that were prescribed by Dr.John have been sent.

## 2023-12-30 ENCOUNTER — Ambulatory Visit: Admitting: Podiatry

## 2023-12-30 NOTE — Telephone Encounter (Signed)
 Please try to re-schedule the visit.  She could be having a bad day - still not justified to behave with staff as noted above. I understand.  But we have to be professional and provide her another opportunity.   Dr. Adeena Bernabe

## 2023-12-30 NOTE — Telephone Encounter (Signed)
 Thank-you and apologies.   Dr. Daphane Odekirk

## 2024-01-04 ENCOUNTER — Other Ambulatory Visit: Payer: Self-pay

## 2024-01-06 ENCOUNTER — Telehealth: Payer: Self-pay

## 2024-01-06 ENCOUNTER — Other Ambulatory Visit (HOSPITAL_COMMUNITY): Payer: Self-pay

## 2024-01-06 NOTE — Telephone Encounter (Signed)
 Copied from CRM [id number]. Topic: Clinical - Prescription Issue >> Jan 06, 2024 11:03 AM Tinnie BROCKS wrote: Reason for CRM: Pt calling to see why her gabapentin  was denied. It states 11/11 refill not appropriate. It looks like it was discontinued due to patient preference by her [place] cardiologist, however patient states she never said that and wanted to keep taking the medication. She is requesting this be remedied and refilled or please give her a call if she needs an appt. #[phone]

## 2024-01-06 NOTE — Telephone Encounter (Signed)
 Copied from CRM #[id number]. Topic: Clinical - Prescription Issue >> Jan 06, 2024 10:52 AM Thersia BROCKS wrote: Reason for CRM: Patient called in regarding medication refills, patient stated she called the pharmacy and they stated they have no medications on file that is prescribed by Dr.John would like a callback on this when it has been sent

## 2024-01-07 ENCOUNTER — Other Ambulatory Visit (HOSPITAL_COMMUNITY): Payer: Self-pay

## 2024-01-07 MED ORDER — GABAPENTIN 300 MG PO CAPS
300.0000 mg | ORAL_CAPSULE | Freq: Three times a day (TID) | ORAL | 1 refills | Status: AC
  Filled 2024-01-07: qty 270, 90d supply, fill #0

## 2024-01-07 NOTE — Telephone Encounter (Signed)
 Ok this was done and sent to [facility] pharmacy

## 2024-01-07 NOTE — Addendum Note (Signed)
 Addended by: NORLEEN LYNWOOD ORN on: 01/07/2024 04:26 PM   Modules accepted: Orders

## 2024-01-07 NOTE — Telephone Encounter (Signed)
This has been addressed in another phone note

## 2024-01-10 ENCOUNTER — Other Ambulatory Visit: Payer: Self-pay

## 2024-01-12 ENCOUNTER — Other Ambulatory Visit (HOSPITAL_COMMUNITY): Payer: Self-pay

## 2024-01-12 ENCOUNTER — Other Ambulatory Visit: Payer: Self-pay

## 2024-01-12 MED ORDER — BACLOFEN 10 MG PO TABS
10.0000 mg | ORAL_TABLET | Freq: Three times a day (TID) | ORAL | 3 refills | Status: AC | PRN
  Filled 2024-01-12: qty 90, 30d supply, fill #0
  Filled 2024-02-03: qty 90, 30d supply, fill #1
  Filled 2024-03-06 – 2024-03-13 (×2): qty 90, 30d supply, fill #2
  Filled ????-??-??: fill #1

## 2024-02-03 ENCOUNTER — Ambulatory Visit (INDEPENDENT_AMBULATORY_CARE_PROVIDER_SITE_OTHER)

## 2024-02-03 ENCOUNTER — Ambulatory Visit: Admitting: Podiatry

## 2024-02-03 ENCOUNTER — Other Ambulatory Visit (HOSPITAL_COMMUNITY): Payer: Self-pay

## 2024-02-03 ENCOUNTER — Encounter: Payer: Self-pay | Admitting: Podiatry

## 2024-02-03 DIAGNOSIS — M2041 Other hammer toe(s) (acquired), right foot: Secondary | ICD-10-CM | POA: Diagnosis not present

## 2024-02-03 NOTE — Patient Instructions (Signed)
 Leave bandage in place and dry for 4 days, then remove. You may wash foot normally after removal of bandage. DO NOT SOAK FOOT! Dry completely afterwards and may use a bandaid over incision if needed. We will follow up with you in 1 weeks for recheck.

## 2024-02-04 ENCOUNTER — Other Ambulatory Visit: Payer: Self-pay

## 2024-02-05 NOTE — Progress Notes (Signed)
 She presents today with a chief complaint of a painful mallet toe to the second digit of the right foot.  She states that is tender walking on it I feel like walking on the tip of it and I am having to try to change my gait and is making the other parts of my foot hurt.  Objective: I have reviewed her past medical history medications allergies surgeries and social history.  At this point mallet toe deformity is reducible second digit right foot.  With a slight distal clavus and tenderness around the nail.  She does have some tenderness on palpation of the dorsal aspect of the DIPJ consistent with early osteoarthritic change.  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  Plan: After thorough discussion oral and written consent was obtained and a flexor tenotomy was performed.  This was performed after local anesthetic was administered the toe was prepped and draped in his normal sterile fashion utilizing an 18-gauge needle at the level of the DIPJ from the plantar aspect I was able to transect the long flexor tendon and straighten the toe.  The area was copiously lavaged and then Dermabond was placed with a dry sterile compressive dressing and a Darco shoe.  I will follow-up with her in a couple of weeks questions or concerns or any signs of infection she will notify us .

## 2024-02-06 ENCOUNTER — Other Ambulatory Visit (HOSPITAL_COMMUNITY): Payer: Self-pay

## 2024-02-07 ENCOUNTER — Other Ambulatory Visit: Payer: Self-pay

## 2024-02-08 ENCOUNTER — Ambulatory Visit (HOSPITAL_COMMUNITY)
Admission: RE | Admit: 2024-02-08 | Discharge: 2024-02-08 | Disposition: A | Discharge status: Home / Self Care | Source: Ambulatory Visit | Attending: Internal Medicine | Admitting: Internal Medicine

## 2024-02-08 DIAGNOSIS — I2510 Atherosclerotic heart disease of native coronary artery without angina pectoris: Secondary | ICD-10-CM | POA: Diagnosis present

## 2024-02-08 DIAGNOSIS — I2584 Coronary atherosclerosis due to calcified coronary lesion: Secondary | ICD-10-CM | POA: Diagnosis present

## 2024-02-09 LAB — ECHOCARDIOGRAM COMPLETE
Area-P 1/2: 2.91 cm2
S' Lateral: 2.9 cm

## 2024-02-10 ENCOUNTER — Ambulatory Visit: Payer: Self-pay | Admitting: Cardiology

## 2024-02-23 ENCOUNTER — Encounter: Payer: Self-pay | Admitting: Pharmacist

## 2024-02-23 ENCOUNTER — Other Ambulatory Visit: Payer: Self-pay

## 2024-02-23 ENCOUNTER — Other Ambulatory Visit (HOSPITAL_COMMUNITY): Payer: Self-pay

## 2024-02-24 ENCOUNTER — Ambulatory Visit (INDEPENDENT_AMBULATORY_CARE_PROVIDER_SITE_OTHER)

## 2024-02-24 ENCOUNTER — Encounter: Payer: Self-pay | Admitting: Podiatry

## 2024-02-24 ENCOUNTER — Ambulatory Visit (INDEPENDENT_AMBULATORY_CARE_PROVIDER_SITE_OTHER): Admitting: Podiatry

## 2024-02-24 DIAGNOSIS — S9032XA Contusion of left foot, initial encounter: Secondary | ICD-10-CM

## 2024-02-24 NOTE — Progress Notes (Signed)
 She presents today for follow-up of her flexor tenotomy at the DIPJ second digit right foot.  States that is a little bit tender underneath but it is much straighter now very happy.  She is also complaining of pain to the dorsal aspect of the left foot right over the first metatarsal phalangeal joint area where she dropped a standalone speaker as she was moving into the house.  States that it felt like she broke her toe.  Objective: Vital signs are stable she is alert and oriented x 3 pulses are palpable.  Walking with a mild antalgic gait for the left foot.  She has tenderness on palpation mild edema no ecchymosis on palpation or visualization of the first metatarsal phalangeal joint or hallux.  Radiographs taken today do not demonstrate any type of osseous abnormalities around the first metatarsophalangeal joint over the hallux.  She does have some osteoarthritic changes to the plantar sesamoidal apparatus.  Second digit right foot is nontender on palpation is sitting very rectus and she is happy with the outcome with that.  Nontender on range of motion.  Assessment: Bony contusion hallux and first metatarsal phalangeal joint area left foot secondary to trauma well-healing surgical toe second digit right foot DIPJ area plantarly.  Plan: Follow-up with me on an as-needed basis explained to her that it would be could be several weeks before that time great toe feels better.  Explained to her that she could use the Darco shoe that she use for her right foot on that left foot.

## 2024-02-26 ENCOUNTER — Other Ambulatory Visit (HOSPITAL_COMMUNITY): Payer: Self-pay

## 2024-02-28 ENCOUNTER — Other Ambulatory Visit: Payer: Self-pay

## 2024-03-02 ENCOUNTER — Other Ambulatory Visit (HOSPITAL_COMMUNITY): Payer: Self-pay

## 2024-03-02 ENCOUNTER — Other Ambulatory Visit: Payer: Self-pay

## 2024-03-06 ENCOUNTER — Other Ambulatory Visit (HOSPITAL_COMMUNITY): Payer: Self-pay

## 2024-03-13 ENCOUNTER — Other Ambulatory Visit (HOSPITAL_COMMUNITY): Payer: Self-pay

## 2024-03-14 ENCOUNTER — Other Ambulatory Visit: Payer: Self-pay

## 2024-05-24 ENCOUNTER — Ambulatory Visit
